# Patient Record
Sex: Female | Born: 1965 | Race: Black or African American | Hispanic: No | Marital: Married | Smoking: Never smoker
Health system: Southern US, Community
[De-identification: ages and names within clinical notes are randomized; demographics above are authoritative.]

## PROBLEM LIST (undated history)

## (undated) DIAGNOSIS — I10 Essential (primary) hypertension: Secondary | ICD-10-CM

## (undated) DIAGNOSIS — E119 Type 2 diabetes mellitus without complications: Secondary | ICD-10-CM

## (undated) DIAGNOSIS — Z87442 Personal history of urinary calculi: Secondary | ICD-10-CM

## (undated) DIAGNOSIS — I82409 Acute embolism and thrombosis of unspecified deep veins of unspecified lower extremity: Secondary | ICD-10-CM

## (undated) DIAGNOSIS — K589 Irritable bowel syndrome without diarrhea: Secondary | ICD-10-CM

## (undated) DIAGNOSIS — I493 Ventricular premature depolarization: Secondary | ICD-10-CM

## (undated) HISTORY — PX: OTHER SURGICAL HISTORY: SHX169

## (undated) HISTORY — DX: Irritable bowel syndrome, unspecified: K58.9

## (undated) HISTORY — DX: Type 2 diabetes mellitus without complications: E11.9

## (undated) HISTORY — DX: Essential (primary) hypertension: I10

## (undated) HISTORY — DX: Acute embolism and thrombosis of unspecified deep veins of unspecified lower extremity: I82.409

## (undated) HISTORY — DX: Ventricular premature depolarization: I49.3

---

## 2000-09-25 ENCOUNTER — Emergency Department (HOSPITAL_COMMUNITY): Admission: EM | Admit: 2000-09-25 | Discharge: 2000-09-26 | Payer: Self-pay | Admitting: *Deleted

## 2000-09-26 ENCOUNTER — Encounter: Payer: Self-pay | Admitting: *Deleted

## 2001-12-09 ENCOUNTER — Emergency Department (HOSPITAL_COMMUNITY): Admission: EM | Admit: 2001-12-09 | Discharge: 2001-12-09 | Payer: Self-pay | Admitting: Emergency Medicine

## 2004-04-26 ENCOUNTER — Ambulatory Visit (HOSPITAL_COMMUNITY): Admission: RE | Admit: 2004-04-26 | Discharge: 2004-04-26 | Payer: Self-pay | Admitting: Family Medicine

## 2004-09-14 ENCOUNTER — Emergency Department (HOSPITAL_COMMUNITY): Admission: EM | Admit: 2004-09-14 | Discharge: 2004-09-14 | Payer: Self-pay | Admitting: Emergency Medicine

## 2004-11-24 ENCOUNTER — Ambulatory Visit: Payer: Self-pay | Admitting: Orthopedic Surgery

## 2005-03-14 ENCOUNTER — Ambulatory Visit (HOSPITAL_COMMUNITY): Admission: RE | Admit: 2005-03-14 | Discharge: 2005-03-14 | Payer: Self-pay | Admitting: Family Medicine

## 2005-04-13 ENCOUNTER — Ambulatory Visit (HOSPITAL_COMMUNITY): Admission: RE | Admit: 2005-04-13 | Discharge: 2005-04-13 | Payer: Self-pay | Admitting: Family Medicine

## 2005-05-16 ENCOUNTER — Ambulatory Visit (HOSPITAL_COMMUNITY): Admission: RE | Admit: 2005-05-16 | Discharge: 2005-05-16 | Payer: Self-pay | Admitting: Family Medicine

## 2005-08-07 ENCOUNTER — Ambulatory Visit (HOSPITAL_COMMUNITY): Admission: RE | Admit: 2005-08-07 | Discharge: 2005-08-07 | Payer: Self-pay | Admitting: Family Medicine

## 2005-09-04 ENCOUNTER — Ambulatory Visit (HOSPITAL_COMMUNITY): Admission: RE | Admit: 2005-09-04 | Discharge: 2005-09-04 | Payer: Self-pay | Admitting: Neurology

## 2005-09-07 ENCOUNTER — Encounter: Admission: RE | Admit: 2005-09-07 | Discharge: 2005-09-07 | Payer: Self-pay | Admitting: Neurology

## 2006-08-28 ENCOUNTER — Emergency Department (HOSPITAL_COMMUNITY): Admission: EM | Admit: 2006-08-28 | Discharge: 2006-08-28 | Payer: Self-pay | Admitting: Emergency Medicine

## 2006-09-07 ENCOUNTER — Ambulatory Visit (HOSPITAL_COMMUNITY): Admission: RE | Admit: 2006-09-07 | Discharge: 2006-09-07 | Payer: Self-pay | Admitting: Urology

## 2007-03-01 ENCOUNTER — Ambulatory Visit (HOSPITAL_COMMUNITY): Admission: RE | Admit: 2007-03-01 | Discharge: 2007-03-01 | Payer: Self-pay | Admitting: Family Medicine

## 2007-09-15 ENCOUNTER — Emergency Department (HOSPITAL_COMMUNITY): Admission: EM | Admit: 2007-09-15 | Discharge: 2007-09-15 | Payer: Self-pay | Admitting: Emergency Medicine

## 2009-07-16 ENCOUNTER — Ambulatory Visit (HOSPITAL_COMMUNITY): Admission: RE | Admit: 2009-07-16 | Discharge: 2009-07-16 | Payer: Self-pay | Admitting: Family Medicine

## 2009-08-26 ENCOUNTER — Ambulatory Visit (HOSPITAL_COMMUNITY): Admission: RE | Admit: 2009-08-26 | Discharge: 2009-08-26 | Payer: Self-pay | Admitting: Family Medicine

## 2009-12-04 ENCOUNTER — Emergency Department (HOSPITAL_COMMUNITY): Admission: EM | Admit: 2009-12-04 | Discharge: 2009-12-04 | Payer: Self-pay | Admitting: Emergency Medicine

## 2010-02-20 ENCOUNTER — Encounter: Payer: Self-pay | Admitting: Family Medicine

## 2010-04-12 LAB — POCT CARDIAC MARKERS
CKMB, poc: <1 ng/mL — ABNORMAL LOW (ref 1.0–8.0)
Myoglobin, poc: 86.2 ng/mL (ref 12–200)
Troponin i, poc: <0.05 ng/mL (ref 0.00–0.09)

## 2010-04-12 LAB — DIFFERENTIAL
Basophils Relative: 0 % (ref 0–1)
Eosinophils Relative: 8 % — ABNORMAL HIGH (ref 0–5)
Monocytes Absolute: 0.3 10*3/uL (ref 0.1–1.0)
Monocytes Relative: 6 % (ref 3–12)
Neutrophils Relative %: 54 % (ref 43–77)

## 2010-04-12 LAB — BASIC METABOLIC PANEL
BUN: 9 mg/dL (ref 6–23)
Calcium: 8.9 mg/dL (ref 8.4–10.5)
Chloride: 103 mEq/L (ref 96–112)
Creatinine, Ser: 0.68 mg/dL (ref 0.4–1.2)
GFR calc non Af Amer: >60 mL/min (ref >60)
Glucose, Bld: 99 mg/dL (ref 70–99)
Potassium: 3.3 mEq/L — ABNORMAL LOW (ref 3.5–5.1)

## 2010-04-12 LAB — CBC
MCV: 81.2 fL (ref 78.0–100.0)
RBC: 4.13 MIL/uL (ref 3.87–5.11)
WBC: 5.3 10*3/uL (ref 4.0–10.5)

## 2010-06-21 ENCOUNTER — Other Ambulatory Visit (HOSPITAL_COMMUNITY): Payer: Self-pay | Admitting: Family Medicine

## 2010-06-21 ENCOUNTER — Ambulatory Visit (HOSPITAL_COMMUNITY)
Admission: RE | Admit: 2010-06-21 | Discharge: 2010-06-21 | Disposition: A | Discharge status: Home / Self Care | Payer: BC Managed Care – PPO | Source: Ambulatory Visit | Attending: Family Medicine | Admitting: Family Medicine

## 2010-06-21 DIAGNOSIS — M1710 Unilateral primary osteoarthritis, unspecified knee: Secondary | ICD-10-CM | POA: Insufficient documentation

## 2010-06-21 DIAGNOSIS — R52 Pain, unspecified: Secondary | ICD-10-CM

## 2010-06-21 DIAGNOSIS — IMO0002 Reserved for concepts with insufficient information to code with codable children: Secondary | ICD-10-CM | POA: Insufficient documentation

## 2010-06-21 DIAGNOSIS — M25569 Pain in unspecified knee: Secondary | ICD-10-CM | POA: Insufficient documentation

## 2010-09-22 ENCOUNTER — Ambulatory Visit: Payer: BC Managed Care – PPO | Admitting: Cardiology

## 2010-10-06 ENCOUNTER — Ambulatory Visit: Payer: BC Managed Care – PPO | Admitting: Cardiology

## 2010-11-10 ENCOUNTER — Ambulatory Visit: Payer: BC Managed Care – PPO | Admitting: Cardiology

## 2010-11-14 LAB — CBC
HCT: 35.1 — ABNORMAL LOW
Hemoglobin: 11.6 — ABNORMAL LOW
Platelets: 276
WBC: 8.1

## 2010-11-14 LAB — COMPREHENSIVE METABOLIC PANEL
ALT: 16
Albumin: 3.6
Alkaline Phosphatase: 61
BUN: 15
Chloride: 108
Glucose, Bld: 136 — ABNORMAL HIGH
Potassium: 3.6
Sodium: 136
Total Bilirubin: 0.3

## 2010-11-14 LAB — DIFFERENTIAL
Basophils Absolute: 0
Basophils Relative: 0
Eosinophils Absolute: 0
Eosinophils Relative: 0
Monocytes Absolute: 0.4
Neutro Abs: 5.8

## 2010-11-14 LAB — URINALYSIS, ROUTINE W REFLEX MICROSCOPIC
Leukocytes, UA: NEGATIVE
Nitrite: NEGATIVE
Specific Gravity, Urine: 1.015
Urobilinogen, UA: 0.2
pH: 6.5

## 2010-11-14 LAB — URINE MICROSCOPIC-ADD ON

## 2010-11-14 LAB — PREGNANCY, URINE: Preg Test, Ur: NEGATIVE

## 2010-11-16 ENCOUNTER — Encounter: Payer: Self-pay | Admitting: Cardiology

## 2010-11-16 ENCOUNTER — Ambulatory Visit: Payer: BC Managed Care – PPO | Admitting: Cardiology

## 2010-11-16 ENCOUNTER — Ambulatory Visit (INDEPENDENT_AMBULATORY_CARE_PROVIDER_SITE_OTHER): Payer: BC Managed Care – PPO | Admitting: Cardiology

## 2010-11-16 DIAGNOSIS — I4949 Other premature depolarization: Secondary | ICD-10-CM

## 2010-11-16 DIAGNOSIS — I1A0 Resistant hypertension: Secondary | ICD-10-CM

## 2010-11-16 DIAGNOSIS — I209 Angina pectoris, unspecified: Secondary | ICD-10-CM | POA: Insufficient documentation

## 2010-11-16 DIAGNOSIS — I10 Essential (primary) hypertension: Secondary | ICD-10-CM

## 2010-11-16 DIAGNOSIS — R0602 Shortness of breath: Secondary | ICD-10-CM | POA: Insufficient documentation

## 2010-11-16 DIAGNOSIS — I493 Ventricular premature depolarization: Secondary | ICD-10-CM | POA: Insufficient documentation

## 2010-11-16 DIAGNOSIS — R079 Chest pain, unspecified: Secondary | ICD-10-CM

## 2010-11-16 HISTORY — DX: Resistant hypertension: I1A.0

## 2010-11-16 NOTE — Assessment & Plan Note (Signed)
Dull, intermittent chest discomfort, also associated with left shoulder discomfort. Features are largely atypical, however some occur with exertion. Cardiac risk factor profile includes recently diagnosed diabetes mellitus, hypertension, and family history. In light of this, associated shortness of breath with activity, and also documented PVCs, further evaluation will be planned via exercise echocardiogram. We will inform her of the results.

## 2010-11-16 NOTE — Assessment & Plan Note (Signed)
Recently diagnosed, followed by Dr. Loleta Chance.

## 2010-11-16 NOTE — Assessment & Plan Note (Signed)
Also described within the last month, further evaluation planned.

## 2010-11-16 NOTE — Assessment & Plan Note (Signed)
Reportedly noted during recent bicycle test in Dr. Adaline Sill office. Patient denies any dizziness or syncope, however has had a sense of palpitations in the last month. LVEF is not known.

## 2010-11-16 NOTE — Progress Notes (Signed)
Clinical Summary Melissa Casey is a 45 y.o.female referred for cardiology consultation by Dr. Mirna Casey. She reports a one-month history of intermittent palpitations, no dizziness or syncope. There has been no clear precipitant. She states that she does not consume alcohol or any significant amount of caffeinated beverage. She also reports an intermittent, aching left shoulder discomfort as well as chest discomfort, sometimes with exertion, but not exclusively. She has also been more short of breath with activity, reporting NYHA class 2-3 symptoms.  She states that she "rode a bicycle" while on ECG in Dr. Adaline Casey office. Specific results are not available, although the patient was reported to have increased number of PVCs.  Lab work from August showed sodium 139, potassium 3.7, BUN 14, creatinine 0.6, cholesterol 139, Was rides 74, HDL 51, LDL 73, AST 14, ALT 13.   No Known Allergies  Medication list reviewed.  Past Medical History  Diagnosis Date  . Essential hypertension, benign   . IBS (irritable bowel syndrome)   . Premature ventricular contractions (PVCs) (VPCs)     Per Dr. Loleta Casey  . Type 2 diabetes mellitus   . DVT (deep venous thrombosis)     History reviewed. No pertinent past surgical history.  Family History  Problem Relation Age of Onset  . Coronary artery disease Mother   . Coronary artery disease Father     Died age 29  . Hypertension Brother   . Arrhythmia Sister     Social History Melissa Casey reports that she has never smoked. She has never used smokeless tobacco. Melissa Casey reports that she does not drink alcohol.  Review of Systems No claudication, no sudden dizziness or syncope. No orthopnea or PND. No reported melena or hematochezia. Reports compliance with medications. No regular exercise regimen. Otherwise negative.  Physical Examination Filed Vitals:   11/16/10 1031  BP: 144/92  Pulse: 80  Resp: 18   Overweight woman in no acute distress. HEENT:  Conjunctiva and lids normal, oropharynx with moist mucosa. Neck: Supple, no elevated JVP or carotid bruits, no thyromegaly. Lungs: Clear to auscultation, nonlabored. Cardiac: Regular rate and rhythm, no S3 gallop or rub. Abdomen: Nontender, bowel sounds present, no bruits. Skin: Warm and dry. Extremities: No pitting edema, distal pulses full. Musculoskeletal: No kyphosis. Neuropsychiatric: Alert and oriented x3, affect appropriate.  ECG Sinus rhythm with poor anterior R wave progression.   Problem List and Plan

## 2010-11-16 NOTE — Patient Instructions (Signed)
Your physician has requested that you have a stress echocardiogram. For further information please visit [email or web address] Please follow instruction sheet as given.  Your physician recommends that you continue on your current medications as directed. Please refer to the Current Medication list given to you today.  Your physician recommends that you schedule a follow-up appointment in: We will contact you with results of test.

## 2010-11-16 NOTE — Assessment & Plan Note (Signed)
Reported history since the late 1990s.

## 2010-11-18 ENCOUNTER — Ambulatory Visit (HOSPITAL_COMMUNITY)
Admission: RE | Admit: 2010-11-18 | Discharge: 2010-11-18 | Disposition: A | Discharge status: Home / Self Care | Payer: BC Managed Care – PPO | Source: Ambulatory Visit | Attending: Cardiology | Admitting: Cardiology

## 2010-11-18 DIAGNOSIS — R079 Chest pain, unspecified: Secondary | ICD-10-CM

## 2010-11-18 DIAGNOSIS — I10 Essential (primary) hypertension: Secondary | ICD-10-CM | POA: Insufficient documentation

## 2010-11-18 DIAGNOSIS — R072 Precordial pain: Secondary | ICD-10-CM

## 2010-11-18 DIAGNOSIS — R0602 Shortness of breath: Secondary | ICD-10-CM

## 2010-11-18 DIAGNOSIS — I4949 Other premature depolarization: Secondary | ICD-10-CM

## 2010-11-18 DIAGNOSIS — E119 Type 2 diabetes mellitus without complications: Secondary | ICD-10-CM | POA: Insufficient documentation

## 2010-11-18 NOTE — Progress Notes (Signed)
Stress Lab Nurses Notes - [name]  Melissa Casey 11/18/2010  Reason for doing test: Chest Pain  Type of test: Stress Echo  Nurse performing test: Parke Poisson, RN  Nuclear Medicine Tech: Not Applicable  Echo Tech: Karrie Doffing  MD performing test: Ival Bible & Joni Reining, NP  Family MD: [facility]  Test explained and consent signed: yes  IV started: No IV started  Symptoms: SOB and fatigue. Had some dizziness in recovery and resolved.  Treatment/Intervention: None  Reason test stopped: fatigue and SOB  After recovery IV was: NA  Patient to return to Nuc. Med at : NA  Patient discharged: Home  Patient's Condition upon discharge was: stable  Comments: During test peak BP 178/78 & HR 152 . Recovery BP 120/78 & HR 92.  Symptoms resolved in recovery.    Erskine Speed T

## 2010-11-18 NOTE — Progress Notes (Signed)
*  PRELIMINARY RESULTS* Echocardiogram Echocardiogram Stress Test has been performed.  Conrad [place] 11/18/2010, 11:14 AM

## 2010-11-21 ENCOUNTER — Encounter: Payer: Self-pay | Admitting: *Deleted

## 2011-02-17 ENCOUNTER — Encounter (HOSPITAL_COMMUNITY): Payer: Self-pay | Admitting: Cardiology

## 2011-12-18 ENCOUNTER — Encounter (HOSPITAL_COMMUNITY): Payer: Self-pay

## 2011-12-18 ENCOUNTER — Emergency Department (HOSPITAL_COMMUNITY)
Admission: EM | Admit: 2011-12-18 | Discharge: 2011-12-18 | Disposition: A | Discharge status: Home / Self Care | Payer: BC Managed Care – PPO | Attending: Emergency Medicine | Admitting: Emergency Medicine

## 2011-12-18 DIAGNOSIS — Z79899 Other long term (current) drug therapy: Secondary | ICD-10-CM | POA: Insufficient documentation

## 2011-12-18 DIAGNOSIS — E119 Type 2 diabetes mellitus without complications: Secondary | ICD-10-CM | POA: Insufficient documentation

## 2011-12-18 DIAGNOSIS — Z7982 Long term (current) use of aspirin: Secondary | ICD-10-CM | POA: Insufficient documentation

## 2011-12-18 DIAGNOSIS — K089 Disorder of teeth and supporting structures, unspecified: Secondary | ICD-10-CM | POA: Insufficient documentation

## 2011-12-18 DIAGNOSIS — K0889 Other specified disorders of teeth and supporting structures: Secondary | ICD-10-CM

## 2011-12-18 DIAGNOSIS — Z86718 Personal history of other venous thrombosis and embolism: Secondary | ICD-10-CM | POA: Insufficient documentation

## 2011-12-18 DIAGNOSIS — I10 Essential (primary) hypertension: Secondary | ICD-10-CM | POA: Insufficient documentation

## 2011-12-18 DIAGNOSIS — Z8719 Personal history of other diseases of the digestive system: Secondary | ICD-10-CM | POA: Insufficient documentation

## 2011-12-18 DIAGNOSIS — I4949 Other premature depolarization: Secondary | ICD-10-CM | POA: Insufficient documentation

## 2011-12-18 MED ORDER — IBUPROFEN 800 MG PO TABS
800.0000 mg | ORAL_TABLET | Freq: Once | ORAL | Status: AC
  Administered 2011-12-18: 800 mg via ORAL
  Filled 2011-12-18: qty 1

## 2011-12-18 MED ORDER — PENICILLIN V POTASSIUM 500 MG PO TABS: 500.0000 mg | ORAL_TABLET | Freq: Four times a day (QID) | ORAL | Status: AC

## 2011-12-18 MED ORDER — IBUPROFEN 800 MG PO TABS: 800.0000 mg | ORAL_TABLET | Freq: Three times a day (TID) | ORAL | Status: DC

## 2011-12-18 MED ORDER — HYDROCODONE-ACETAMINOPHEN 5-325 MG PO TABS: ORAL_TABLET | ORAL | Status: DC

## 2011-12-18 MED ORDER — PENICILLIN V POTASSIUM 250 MG PO TABS
500.0000 mg | ORAL_TABLET | Freq: Once | ORAL | Status: AC
  Administered 2011-12-18: 500 mg via ORAL
  Filled 2011-12-18: qty 2

## 2011-12-18 NOTE — ED Notes (Signed)
Pt c/o toothache and r sided facial swelling since Friday.

## 2011-12-18 NOTE — ED Provider Notes (Signed)
Medical screening examination/treatment/procedure(s) were performed by non-physician practitioner and as supervising physician I was immediately available for consultation/collaboration. Devoria Albe, MD, Armando Gang    Ward Givens, MD 12/18/11 [phone]

## 2011-12-18 NOTE — ED Provider Notes (Addendum)
History     CSN: [id number]  Arrival date & time 12/18/11  8657   First MD Initiated Contact with Patient 12/18/11 [phone]      Chief Complaint  Patient presents with  . Dental Pain    (Consider location/radiation/quality/duration/timing/severity/associated sxs/prior treatment) Patient is a 46 y.o. female presenting with tooth pain. The history is provided by the patient.  Dental PainThe primary symptoms include mouth pain. Primary symptoms do not include dental injury, oral bleeding, oral lesions, headaches, fever, shortness of breath, sore throat, angioedema or cough. The symptoms began 3 to 5 days ago. The symptoms are unchanged. The symptoms are new. The symptoms occur constantly.  Mouth pain began 3 - 5 days ago. Mouth pain occurs constantly. Mouth pain is worsening. Affected locations include: teeth and gum(s).  Additional symptoms include: dental sensitivity to temperature, gum swelling, gum tenderness and facial swelling. Additional symptoms do not include: purulent gums, trismus, trouble swallowing, pain with swallowing, drooling, ear pain and swollen glands. Medical issues include: periodontal disease. Medical issues do not include: smoking.    Past Medical History  Diagnosis Date  . Essential hypertension, benign   . IBS (irritable bowel syndrome)   . Premature ventricular contractions (PVCs) (VPCs)     Per Dr. Loleta Chance  . Type 2 diabetes mellitus   . DVT (deep venous thrombosis)     History reviewed. No pertinent past surgical history.  Family History  Problem Relation Age of Onset  . Coronary artery disease Mother   . Coronary artery disease Father     Died age 83  . Hypertension Brother   . Arrhythmia Sister     History  Substance Use Topics  . Smoking status: Never Smoker   . Smokeless tobacco: Never Used  . Alcohol Use: No    OB History    Grav Para Term Preterm Abortions TAB SAB Ect Mult Living                  Review of Systems  Constitutional:  Negative for fever and appetite change.  HENT: Positive for facial swelling and dental problem. Negative for ear pain, congestion, sore throat, drooling, trouble swallowing, neck pain and neck stiffness.   Eyes: Negative for pain and visual disturbance.  Respiratory: Negative for cough and shortness of breath.   Gastrointestinal: Negative for nausea and vomiting.  Musculoskeletal: Negative for arthralgias.  Skin: Negative for rash.  Neurological: Negative for dizziness, facial asymmetry and headaches.  Hematological: Negative for adenopathy.  All other systems reviewed and are negative.    Allergies  Review of patient's allergies indicates no known allergies.  Home Medications   Current Outpatient Rx  Name  Route  Sig  Dispense  Refill  . ASPIRIN 81 MG PO TABS   Oral   Take 81 mg by mouth daily.           Marland Kitchen LIBRAX PO   Oral   Take 10 mg by mouth at bedtime.           . IBUPROFEN 800 MG PO TABS   Oral   Take 800 mg by mouth 2 (two) times daily.           . MELOXICAM 7.5 MG PO TABS   Oral   Take 7.5 mg by mouth daily.           Marland Kitchen METFORMIN HCL 1000 MG PO TABS   Oral   Take 1,000 mg by mouth daily with breakfast.           .  OLMESARTAN-AMLODIPINE-HCTZ 40-5-25 MG PO TABS   Oral   Take 1 tablet by mouth daily.           Marland Kitchen ALIGN 4 MG PO CAPS   Oral   Take 1 capsule by mouth daily.             BP 149/81  Pulse 81  Temp 98.7 F (37.1 C) (Oral)  Resp 20  Ht 5\' 8"  (1.727 m)  Wt 265 lb (120.203 kg)  BMI 40.29 kg/m2  SpO2 100%  Physical Exam  Nursing note and vitals reviewed. Constitutional: She is oriented to person, place, and time. She appears well-developed and well-nourished. No distress.  HENT:  Head: Normocephalic and atraumatic. No trismus in the jaw.  Right Ear: Tympanic membrane and ear canal normal.  Left Ear: Tympanic membrane and ear canal normal.  Mouth/Throat: Uvula is midline, oropharynx is clear and moist and mucous membranes are  normal. Dental caries present. No dental abscesses or uvula swelling.         significant dental decay with ttp of the right upper incisor and premolar.  Mild erythema and swelling of the surrounding gums.  Slight induration of the right face at the nasolabial fold.  No erythema.  Likely early dental abscess.   Eyes: EOM are normal. Pupils are equal, round, and reactive to light.  Neck: Normal range of motion. Neck supple.  Cardiovascular: Normal rate, regular rhythm, normal heart sounds and intact distal pulses.   No murmur heard. Pulmonary/Chest: Effort normal and breath sounds normal.  Musculoskeletal: Normal range of motion.  Lymphadenopathy:    She has no cervical adenopathy.  Neurological: She is alert and oriented to person, place, and time. She exhibits normal muscle tone. Coordination normal.  Skin: Skin is warm and dry.    ED Course  Procedures (including critical care time)  Labs Reviewed - No data to display No results found.      MDM    Likely early dental abscess, pt is non-toxic appearing.  Has appt with her dentist in 3 days.    Prescribed Pen VK Ibuprofen norco #10      Donzel Romack L. Tonie Elsey, PA 12/18/11 0914  Graeson [place] L. [place], [place] 01/01/12 1654

## 2012-01-02 NOTE — ED Provider Notes (Signed)
Medical screening examination/treatment/procedure(s) were performed by non-physician practitioner and as supervising physician I was immediately available for consultation/collaboration. Devoria Albe, MD, FACEP   Ward Givens, MD 01/02/12 [phone]

## 2012-04-04 ENCOUNTER — Encounter: Payer: Self-pay | Admitting: Adult Health

## 2012-04-04 ENCOUNTER — Encounter: Payer: BC Managed Care – PPO | Admitting: Adult Health

## 2012-04-04 NOTE — Progress Notes (Signed)
Error. No show.

## 2012-04-05 ENCOUNTER — Ambulatory Visit: Payer: Self-pay | Admitting: Adult Health

## 2012-04-15 ENCOUNTER — Ambulatory Visit (INDEPENDENT_AMBULATORY_CARE_PROVIDER_SITE_OTHER): Payer: BC Managed Care – PPO | Admitting: Adult Health

## 2012-04-15 ENCOUNTER — Encounter: Payer: Self-pay | Admitting: Adult Health

## 2012-04-15 VITALS — BP 159/78 | HR 79 | Ht 66.0 in | Wt 266.2 lb

## 2012-04-15 DIAGNOSIS — R0602 Shortness of breath: Secondary | ICD-10-CM

## 2012-04-15 DIAGNOSIS — I10 Essential (primary) hypertension: Secondary | ICD-10-CM

## 2012-04-15 DIAGNOSIS — R079 Chest pain, unspecified: Secondary | ICD-10-CM

## 2012-04-15 MED ORDER — OLMESARTAN-AMLODIPINE-HCTZ 40-10-25 MG PO TABS: 1.0000 | ORAL_TABLET | Freq: Every day | ORAL | Status: DC

## 2012-04-15 NOTE — Progress Notes (Signed)
Name: Melissa Casey    DOB: 1965/10/03  Age: 47 y.o.  MR#: [id number]       PCP:  Evlyn Courier, MD      Insurance: Payor: BLUE CROSS BLUE [employer]IELD  Plan: BCBS PPO OUT OF STATE  Product Type: *No Product type*    CC:    Chief Complaint  Patient presents with  . Chest Pain    VS Filed Vitals:   04/15/12 1356  BP: 159/78  Pulse: 79  Height: 5\' 6"  (1.676 m)  Weight: 266 lb 4 oz (120.77 kg)    Weights Current Weight  04/15/12 266 lb 4 oz (120.77 kg)  12/18/11 265 lb (120.203 kg)  11/16/10 256 lb (116.121 kg)    Blood Pressure  BP Readings from Last 3 Encounters:  04/15/12 159/78  12/18/11 149/81  11/16/10 144/92     Admit date:  (Not on file) Last encounter with RMR:  04/05/2012   Allergy Review of patient's allergies indicates no known allergies.  Current Outpatient Prescriptions  Medication Sig Dispense Refill  . aspirin 81 MG tablet Take 81 mg by mouth daily.        Marland Kitchen HYDROcodone-acetaminophen (NORCO/VICODIN) 5-325 MG per tablet Take one-two tabs po q 4-6 hrs prn pain  10 tablet  0  . ibuprofen (ADVIL,MOTRIN) 800 MG tablet Take 800 mg by mouth 2 (two) times daily.        . Olmesartan-Amlodipine-HCTZ (TRIBENZOR) 40-5-25 MG TABS Take 1 tablet by mouth daily.         No current facility-administered medications for this visit.    Discontinued Meds:    Medications Discontinued During This Encounter  Medication Reason  . Clidinium-Chlordiazepoxide (LIBRAX PO) Error  . ibuprofen (ADVIL,MOTRIN) 800 MG tablet Error  . meloxicam (MOBIC) 7.5 MG tablet Error  . Probiotic Product (ALIGN) 4 MG CAPS Error  . metFORMIN (GLUCOPHAGE) 1000 MG tablet Error    Patient Active Problem List  Diagnosis  . Chest pain  . Shortness of breath  . PVC's (premature ventricular contractions)  . Essential hypertension, benign  . Type II or unspecified type diabetes mellitus without mention of complication, uncontrolled    LABS    Component Value Date/Time   NA 138 12/04/2009 1156    NA 136 08/28/2006 0732   K 3.3* 12/04/2009 1156   K 3.6 08/28/2006 0732   CL 103 12/04/2009 1156   CL 108 08/28/2006 0732   CO2 27 12/04/2009 1156   CO2 22 08/28/2006 0732   GLUCOSE 99 12/04/2009 1156   GLUCOSE 136* 08/28/2006 0732   BUN 9 12/04/2009 1156   BUN 15 08/28/2006 0732   CREATININE 0.68 12/04/2009 1156   CREATININE 1.05 08/28/2006 0732   CALCIUM 8.9 12/04/2009 1156   CALCIUM 8.9 08/28/2006 0732   GFRNONAA >60 12/04/2009 1156   GFRNONAA 58* 08/28/2006 0732   GFRAA  Value: >60        The eGFR has been calculated using the MDRD equation. This calculation has not been validated in all clinical situations. eGFR's persistently <60 mL/min signify possible Chronic Kidney Disease. 12/04/2009 1156   GFRAA  Value: >60        The eGFR has been calculated using the MDRD equation. This calculation has not been validated in all clinical 08/28/2006 0732   CMP     Component Value Date/Time   NA 138 12/04/2009 1156   K 3.3* 12/04/2009 1156   CL 103 12/04/2009 1156   CO2 27 12/04/2009 1156  GLUCOSE 99 12/04/2009 1156   BUN 9 12/04/2009 1156   CREATININE 0.68 12/04/2009 1156   CALCIUM 8.9 12/04/2009 1156   PROT 7.0 08/28/2006 0732   ALBUMIN 3.6 08/28/2006 0732   AST 19 08/28/2006 0732   ALT 16 08/28/2006 0732   ALKPHOS 61 08/28/2006 0732   BILITOT 0.3 08/28/2006 0732   GFRNONAA >60 12/04/2009 1156   GFRAA  Value: >60        The eGFR has been calculated using the MDRD equation. This calculation has not been validated in all clinical situations. eGFR's persistently <60 mL/min signify possible Chronic Kidney Disease. 12/04/2009 1156       Component Value Date/Time   WBC 5.3 12/04/2009 1156   WBC 8.1 08/28/2006 0732   HGB 11.0* 12/04/2009 1156   HGB 11.6* 08/28/2006 0732   HCT 33.6* 12/04/2009 1156   HCT 35.1* 08/28/2006 0732   MCV 81.2 12/04/2009 1156   MCV 81.9 08/28/2006 0732    Lipid Panel  No results found for this basename: chol, trig, hdl, cholhdl, vldl, ldlcalc    ABG No results found for this basename:  phart, pco2, pco2art, po2, po2art, hco3, tco2, acidbasedef, o2sat     No results found for this basename: TSH   BNP (last 3 results) No results found for this basename: PROBNP,  in the last 8760 hours Cardiac Panel (last 3 results) No results found for this basename: CKTOTAL, CKMB, TROPONINI, RELINDX,  in the last 72 hours  Iron/TIBC/Ferritin No results found for this basename: iron, tibc, ferritin     EKG Orders placed in visit on 04/15/12  . EKG 12-LEAD     Prior Assessment and Plan Problem List as of 04/15/2012     ICD-9-CM   Chest pain   Last Assessment & Plan   11/16/2010 Office Visit Written 11/16/2010 11:45 AM by Jonelle Sidle, MD     Dull, intermittent chest discomfort, also associated with left shoulder discomfort. Features are largely atypical, however some occur with exertion. Cardiac risk factor profile includes recently diagnosed diabetes mellitus, hypertension, and family history. In light of this, associated shortness of breath with activity, and also documented PVCs, further evaluation will be planned via exercise echocardiogram. We will inform her of the results.    Shortness of breath   Last Assessment & Plan   11/16/2010 Office Visit Written 11/16/2010 11:46 AM by Jonelle Sidle, MD     Also described within the last month, further evaluation planned.    PVC's (premature ventricular contractions)   Last Assessment & Plan   11/16/2010 Office Visit Written 11/16/2010 11:44 AM by Jonelle Sidle, MD     Reportedly noted during recent bicycle test in Dr. Adaline Sill office. Patient denies any dizziness or syncope, however has had a sense of palpitations in the last month. LVEF is not known.    Essential hypertension, benign   Last Assessment & Plan   11/16/2010 Office Visit Written 11/16/2010 11:46 AM by Jonelle Sidle, MD     Reported history since the late 1990s.    Type II or unspecified type diabetes mellitus without mention of complication,  uncontrolled   Last Assessment & Plan   11/16/2010 Office Visit Written 11/16/2010 11:46 AM by Jonelle Sidle, MD     Recently diagnosed, followed by Dr. Loleta Chance.        Imaging: No results found.

## 2012-04-15 NOTE — Progress Notes (Signed)
   HPI: Melissa Casey is a 47 year old patient of Dr. Simona Huh we are following for ongoing assessment and management of palpitations, with history of hypertension, diabetes, and nonspecific chest discomfort. The patient was seen by Dr. Durene Fruits last in October of 2012. The patient had a stress echocardiogram completed October 2012 that was negative for ischemia. On last visit she complained of recurrent chest pain I stress echo was ordered. She is here to discuss the results. She continues to complain of some heartburn my symptoms, and feeling as if food is stuck in her esophagus especially at night when lying down. Chest and tingling in her left hand when this occurs. Otherwise the patient has had no new complaints.  No Known Allergies  Current Outpatient Prescriptions  Medication Sig Dispense Refill  . aspirin 81 MG tablet Take 81 mg by mouth daily.        . Clidinium-Chlordiazepoxide (LIBRAX PO) Take 10 mg by mouth at bedtime.        Marland Kitchen HYDROcodone-acetaminophen (NORCO/VICODIN) 5-325 MG per tablet Take one-two tabs po q 4-6 hrs prn pain  10 tablet  0  . ibuprofen (ADVIL,MOTRIN) 800 MG tablet Take 800 mg by mouth 2 (two) times daily.        Marland Kitchen ibuprofen (ADVIL,MOTRIN) 800 MG tablet Take 1 tablet (800 mg total) by mouth 3 (three) times daily. Take with food  21 tablet  0  . meloxicam (MOBIC) 7.5 MG tablet Take 7.5 mg by mouth daily.        . metFORMIN (GLUCOPHAGE) 1000 MG tablet Take 1,000 mg by mouth daily with breakfast.        . Olmesartan-Amlodipine-HCTZ (TRIBENZOR) 40-5-25 MG TABS Take 1 tablet by mouth daily.        . Probiotic Product (ALIGN) 4 MG CAPS Take 1 capsule by mouth daily.         No current facility-administered medications for this visit.    Past Medical History  Diagnosis Date  . Essential hypertension, benign   . IBS (irritable bowel syndrome)   . Premature ventricular contractions (PVCs) (VPCs)     Per Dr. Loleta Chance  . Type 2 diabetes mellitus   . DVT (deep venous  thrombosis)     No past surgical history on file.  WUJ:WJXBJY of systems complete and found to be negative unless listed above  PHYSICAL EXAM There were no vitals taken for this visit. General: Well developed, well nourished, in no acute distress Head: Eyes PERRLA, No xanthomas.   Normal cephalic and atramatic  Lungs: Clear bilaterally to auscultation and percussion. Heart: HRRR S1 S2, without MRG.  Pulses are 2+ & equal.            No carotid bruit. No JVD.  No abdominal bruits. No femoral bruits. Abdomen: Bowel sounds are positive, abdomen soft and non-tender without masses or                  Hernia's noted. Msk:  Back normal, normal gait. Normal strength and tone for age. Extremities: No clubbing, cyanosis or edema.  DP +1 Neuro: Alert and oriented X 3. Psych:  Good affect, responds appropriately  EKG: Normal sinus rhythm, nonspecific T-wave abnormalities noted laterally. Rate of 74 beats per minute.  ASSESSMENT AND PLAN

## 2012-04-15 NOTE — Assessment & Plan Note (Signed)
Blood pressure is not well-controlled currently for someone with diabetes. She is on samples of TriBenZor (Omnesartan 40 mg-amlodipine 5 mg-HCTZ 25 mg) which she is receiving samples from her primary care physician Dr. Loleta Chance. Optimal blood pressure control will have her systolic blood pressure in the 130s to 135 millimeters mercury. I have asked her to go by Dr. Adaline Sill office with an increase in her medication to include increased amlodipine to 5 mg if the bad sample strength is available. She verbalizes understanding

## 2012-04-15 NOTE — Addendum Note (Signed)
Addended by: Reather Laurence A on: 04/15/2012 02:38 PM   Modules accepted: Orders

## 2012-04-15 NOTE — Assessment & Plan Note (Signed)
Stress echocardiogram completed revealed no echocardiographic evidence for stress-induced ischemia, EKG showed no diagnostic ST changes or inducible arrhythmias. The stress ECG was negative for ischemia. I explained this to her and she verbalizes understanding. She continues to be concerned about heartburn type symptoms along with feelings of food being stuck in her esophagus when she lies down. I started her on a low-dose H2 blocker Pepcid 20 mg daily. She is to followup with her primary care physician for ongoing assessment and need to refer to GI at his discretion.

## 2012-04-15 NOTE — Patient Instructions (Addendum)
Your physician recommends that you schedule a follow-up appointment in: 6 months  Your physician has recommended you make the following change in your medication:  1 - INCREASE Tribenzor to 40-10-25 mg - obtain samples from Dr Loleta Chance 2 - START Pepcid 20 mg daily  Follow up with Dr Loleta Chance soon

## 2012-04-15 NOTE — Assessment & Plan Note (Signed)
This can be multifactorial secondary to obesity, deconditioning, and questionable poor sleep hygiene. Further asses for her symptoms. For her symptoms. We will see her again in 6 months unless she becomes further symptomatic at which time we will have to plan a catheterization.sment by primary care physician in this setting is recommended. No cardiac etiology is noted

## 2012-07-25 ENCOUNTER — Other Ambulatory Visit (HOSPITAL_COMMUNITY): Payer: Self-pay | Admitting: Family Medicine

## 2012-07-25 ENCOUNTER — Ambulatory Visit (HOSPITAL_COMMUNITY)
Admission: RE | Admit: 2012-07-25 | Discharge: 2012-07-25 | Disposition: A | Discharge status: Home / Self Care | Payer: BC Managed Care – PPO | Source: Ambulatory Visit | Attending: Family Medicine | Admitting: Family Medicine

## 2012-07-25 DIAGNOSIS — M8530 Osteitis condensans, unspecified site: Secondary | ICD-10-CM | POA: Insufficient documentation

## 2012-07-25 DIAGNOSIS — K5900 Constipation, unspecified: Secondary | ICD-10-CM | POA: Insufficient documentation

## 2012-08-13 ENCOUNTER — Other Ambulatory Visit (HOSPITAL_COMMUNITY): Payer: Self-pay | Admitting: Family Medicine

## 2012-08-13 DIAGNOSIS — N200 Calculus of kidney: Secondary | ICD-10-CM

## 2012-08-14 ENCOUNTER — Ambulatory Visit (HOSPITAL_COMMUNITY): Payer: BC Managed Care – PPO

## 2012-08-16 ENCOUNTER — Ambulatory Visit (HOSPITAL_COMMUNITY)
Admission: RE | Admit: 2012-08-16 | Discharge: 2012-08-16 | Disposition: A | Discharge status: Home / Self Care | Payer: BC Managed Care – PPO | Source: Ambulatory Visit | Attending: Family Medicine | Admitting: Family Medicine

## 2012-08-16 DIAGNOSIS — N200 Calculus of kidney: Secondary | ICD-10-CM | POA: Insufficient documentation

## 2012-08-16 DIAGNOSIS — I10 Essential (primary) hypertension: Secondary | ICD-10-CM | POA: Insufficient documentation

## 2012-08-16 DIAGNOSIS — E119 Type 2 diabetes mellitus without complications: Secondary | ICD-10-CM | POA: Insufficient documentation

## 2013-03-21 ENCOUNTER — Telehealth: Payer: Self-pay | Admitting: Adult Health

## 2013-03-21 NOTE — Telephone Encounter (Signed)
Requested pre auth for med to be re[facility]ed [facility],attention Terry

## 2013-03-21 NOTE — Telephone Encounter (Signed)
Received fax refill request  Rx # [id number] Medication:  Tribenzor 40-10-25 mg tab Qty 30 Sig:  Take one tablet by mouth once daily Physician:  Lyman BishopLawrence

## 2013-03-21 NOTE — Telephone Encounter (Signed)
Needs recall from last visit 03/2012 sent to T.Roseanne RenoStewart and T.jackson to schedule    [facility] of [facility] pre auth  [facility]

## 2013-03-21 NOTE — Telephone Encounter (Signed)
Please see paper in refill bin / tgs  °

## 2013-04-01 ENCOUNTER — Encounter: Payer: Self-pay | Admitting: *Deleted

## 2013-04-02 ENCOUNTER — Other Ambulatory Visit: Payer: Self-pay | Admitting: *Deleted

## 2013-04-02 ENCOUNTER — Encounter: Payer: Self-pay | Admitting: *Deleted

## 2013-04-02 DIAGNOSIS — I10 Essential (primary) hypertension: Secondary | ICD-10-CM

## 2013-04-02 DIAGNOSIS — I493 Ventricular premature depolarization: Secondary | ICD-10-CM

## 2013-04-07 ENCOUNTER — Encounter: Payer: BC Managed Care – PPO | Admitting: Adult Health

## 2013-04-07 ENCOUNTER — Encounter: Payer: Self-pay | Admitting: *Deleted

## 2013-04-07 NOTE — Progress Notes (Signed)
    HPI: [name] is a 48 year old patient of Melissa Casey we are following for ongoing assessment and management of palpitations, history of hypertension, diabetes, and nonspecific chest discomfort. Is seen in the office one year ago. She had a stress echocardiogram completed in March of 2014 revealing no echocardiographic evidence for stress-induced ischemia. EKG showed no diagnostic ST changes or inducible arrhythmias. Overall the stress test was found to be negative for ischemia.  On last visit the patient's blood pressure was not well-controlled, she was given samples of high been sore (on the Star and 40 mg, amlodipine 5 mg, HCTZ 25 mg, (as she is receiving niece also from her primary care physician Melissa Casey.  No Known Allergies  Current Outpatient Prescriptions  Medication Sig Dispense Refill  . aspirin 81 MG tablet Take 81 mg by mouth daily.        Marland Kitchen. HYDROcodone-acetaminophen (NORCO/VICODIN) 5-325 MG per tablet Take one-two tabs po q 4-6 hrs prn pain  10 tablet  0  . ibuprofen (ADVIL,MOTRIN) 800 MG tablet Take 800 mg by mouth 2 (two) times daily.        . Olmesartan-Amlodipine-HCTZ 40-10-25 MG TABS Take 1 tablet by mouth daily.  30 tablet  6  . Saxagliptin-Metformin (KOMBIGLYZE XR) 2.05-998 MG TB24 Take 1 tablet by mouth.       No current facility-administered medications for this visit.    Past Medical History  Diagnosis Date  . Essential hypertension, benign   . IBS (irritable bowel syndrome)   . Premature ventricular contractions (PVCs) (VPCs)     Per Melissa Casey  . Type 2 diabetes mellitus   . DVT (deep venous thrombosis)     No past surgical history on file.  ROS: PHYSICAL EXAM There were no vitals taken for this visit.  EKG:  ASSESSMENT AND PLAN

## 2013-04-10 ENCOUNTER — Ambulatory Visit (INDEPENDENT_AMBULATORY_CARE_PROVIDER_SITE_OTHER): Payer: BC Managed Care – PPO | Admitting: Adult Health

## 2013-04-10 ENCOUNTER — Encounter: Payer: Self-pay | Admitting: Adult Health

## 2013-04-10 VITALS — BP 135/65 | HR 81 | Ht 66.0 in | Wt 271.0 lb

## 2013-04-10 DIAGNOSIS — R002 Palpitations: Secondary | ICD-10-CM

## 2013-04-10 DIAGNOSIS — R0602 Shortness of breath: Secondary | ICD-10-CM

## 2013-04-10 DIAGNOSIS — I10 Essential (primary) hypertension: Secondary | ICD-10-CM

## 2013-04-10 MED ORDER — OLMESARTAN MEDOXOMIL-HCTZ 40-12.5 MG PO TABS: 1.0000 | ORAL_TABLET | Freq: Every day | ORAL | Status: DC

## 2013-04-10 NOTE — Progress Notes (Signed)
    HPI: [name] is a 48 year old patient of Dr. Diona BrownerMcDowell we are following for ongoing assessment and management of palpitations, hypertension, with history of diabetes and nonspecific chest discomfort. The patient was last seen in the office on April 15 2012, for discussion of stress echo results with complaints of recurrent chest pain. She also complained of heartburn symptoms.    Stress echocardiogram revealed no evidence of stress-induced ischemia. She was referred to GI for evaluation of esophageal abnormalities and was placed on a PPI. Blood pressure was not adequately controlled on last visit. She is receiving samples from her primary care physician Dr. Loleta ChanceHill to include on the start and 40 mg, amlodipine 5 mg and HCTZ 25 mg daily.      She was advised to go by Dr. Adaline SillHill's office to get increase the dose of amlodipine portion of her medication, as she is on TriBenZor combination medication. She also complained of some mild shortness of breath which was thought to be related to deconditioning.    She is continuing to have issues with GERD but has not had any GI follow up. She states that TriBenZor is too expensive for her and she wishe to have a different medication that is of lower cost.   No Known Allergies  Current Outpatient Prescriptions  Medication Sig Dispense Refill  . aspirin 81 MG tablet Take 81 mg by mouth daily.        Marland Kitchen. ibuprofen (ADVIL,MOTRIN) 800 MG tablet Take 800 mg by mouth 2 (two) times daily.        . Olmesartan-Amlodipine-HCTZ 40-10-25 MG TABS Take 1 tablet by mouth daily.  30 tablet  6  . Saxagliptin-Metformin (KOMBIGLYZE XR) 2.05-998 MG TB24 Take 1 tablet by mouth.       No current facility-administered medications for this visit.    Past Medical History  Diagnosis Date  . Essential hypertension, benign   . IBS (irritable bowel syndrome)   . Premature ventricular contractions (PVCs) (VPCs)     Per Dr. Loleta ChanceHill  . Type 2 diabetes mellitus   . DVT (deep venous  thrombosis)     History reviewed. No pertinent past surgical history.  ROS: PHYSICAL EXAM BP 135/65  Pulse 81  Ht 5\' 6"  (1.676 m)  Wt 271 lb (122.925 kg)  BMI 43.76 kg/m2  EKG:  ASSESSMENT AND PLAN

## 2013-04-10 NOTE — Assessment & Plan Note (Signed)
I advised her to followup with Dr. Loleta ChanceHill, and ask about sleep apnea, and possible sleep study due to body habitus and ongoing hypertension.

## 2013-04-10 NOTE — Assessment & Plan Note (Addendum)
He is requesting more samples for antihypertensive medications. We do not have any available. She is concerned about the cost of her current medication. I have advised her that we can give her a prescription for Benicar 40 mg/HCTZ 12.5 mg. She has [employer] and [employer] and this should help to cover the cost. It is not on [employer] [facility] plan. Would prefer that she be on an ARB.   Addendum:  The patient is upset at the cost of the medication prescribed, stating now that a pre-authorization should be completed and it is too expensive for her. She now be placed on lisinopril 10/12.5 mg daily.We will see her in a year and have her see PCP on follow-up.

## 2013-04-10 NOTE — Progress Notes (Deleted)
Name: Melissa Casey    DOB: 08/07/1965  Age: 48 y.o.  MR#: [id number]       PCP:  Maggie Font, MD      Insurance: Payor: [place] / Plan: [place] / Product Type: *No Product type* /   CC:    Chief Complaint  Patient presents with  . Hypertension    VS Filed Vitals:   04/10/13 1531  BP: 135/65  Pulse: 81  Height: '5\' 6"'  (1.676 m)  Weight: 271 lb (122.925 kg)    Weights Current Weight  04/10/13 271 lb (122.925 kg)  04/15/12 266 lb 4 oz (120.77 kg)  12/18/11 265 lb (120.203 kg)    Blood Pressure  BP Readings from Last 3 Encounters:  04/10/13 135/65  04/15/12 159/78  12/18/11 149/81     Admit date:  (Not on file) Last encounter with RMR:  03/21/2013   Allergy Review of patient's allergies indicates no known allergies.  Current Outpatient Prescriptions  Medication Sig Dispense Refill  . aspirin 81 MG tablet Take 81 mg by mouth daily.        Marland Kitchen ibuprofen (ADVIL,MOTRIN) 800 MG tablet Take 800 mg by mouth 2 (two) times daily.        . Olmesartan-Amlodipine-HCTZ 40-10-25 MG TABS Take 1 tablet by mouth daily.  30 tablet  6  . Saxagliptin-Metformin (KOMBIGLYZE XR) 2.05-998 MG TB24 Take 1 tablet by mouth.       No current facility-administered medications for this visit.    Discontinued Meds:    Medications Discontinued During This Encounter  Medication Reason  . HYDROcodone-acetaminophen (NORCO/VICODIN) 5-325 MG per tablet Error    Patient Active Problem List   Diagnosis Date Noted  . Chest pain 11/16/2010  . Shortness of breath 11/16/2010  . PVC's (premature ventricular contractions) 11/16/2010  . Essential hypertension, benign 11/16/2010  . Type II or unspecified type diabetes mellitus without mention of complication, uncontrolled 11/16/2010    LABS    Component Value Date/Time   NA 138 12/04/2009 1156   NA 136 08/28/2006 0732   K 3.3* 12/04/2009 1156   K 3.6 08/28/2006 0732   CL 103 12/04/2009 1156   CL 108 08/28/2006 0732   CO2  27 12/04/2009 1156   CO2 22 08/28/2006 0732   GLUCOSE 99 12/04/2009 1156   GLUCOSE 136* 08/28/2006 0732   BUN 9 12/04/2009 1156   BUN 15 08/28/2006 0732   CREATININE 0.68 12/04/2009 1156   CREATININE 1.05 08/28/2006 0732   CALCIUM 8.9 12/04/2009 1156   CALCIUM 8.9 08/28/2006 0732   GFRNONAA >60 12/04/2009 1156   GFRNONAA 58* 08/28/2006 0732   GFRAA  Value: >60        The eGFR has been calculated using the MDRD equation. This calculation has not been validated in all clinical situations. eGFR's persistently <60 mL/min signify possible Chronic Kidney Disease. 12/04/2009 1156   GFRAA  Value: >60        The eGFR has been calculated using the MDRD equation. This calculation has not been validated in all clinical 08/28/2006 0732   CMP     Component Value Date/Time   NA 138 12/04/2009 1156   K 3.3* 12/04/2009 1156   CL 103 12/04/2009 1156   CO2 27 12/04/2009 1156   GLUCOSE 99 12/04/2009 1156   BUN 9 12/04/2009 1156   CREATININE 0.68 12/04/2009 1156   CALCIUM 8.9 12/04/2009 1156   PROT 7.0 08/28/2006 0732   ALBUMIN 3.6  08/28/2006 0732   AST 19 08/28/2006 0732   ALT 16 08/28/2006 0732   ALKPHOS 61 08/28/2006 0732   BILITOT 0.3 08/28/2006 0732   GFRNONAA >60 12/04/2009 1156   GFRAA  Value: >60        The eGFR has been calculated using the MDRD equation. This calculation has not been validated in all clinical situations. eGFR's persistently <60 mL/min signify possible Chronic Kidney Disease. 12/04/2009 1156       Component Value Date/Time   WBC 5.3 12/04/2009 1156   WBC 8.1 08/28/2006 0732   HGB 11.0* 12/04/2009 1156   HGB 11.6* 08/28/2006 0732   HCT 33.6* 12/04/2009 1156   HCT 35.1* 08/28/2006 0732   MCV 81.2 12/04/2009 1156   MCV 81.9 08/28/2006 0732    Lipid Panel  No results found for this basename: chol, trig, hdl, cholhdl, vldl, ldlcalc    ABG No results found for this basename: phart, pco2, pco2art, po2, po2art, hco3, tco2, acidbasedef, o2sat     No results found for this basename: TSH   BNP (last 3  results) No results found for this basename: PROBNP,  in the last 8760 hours Cardiac Panel (last 3 results) No results found for this basename: CKTOTAL, CKMB, TROPONINI, RELINDX,  in the last 72 hours  Iron/TIBC/Ferritin No results found for this basename: iron, tibc, ferritin     EKG Orders placed in visit on 04/10/13  . EKG 12-LEAD     Prior Assessment and Plan Problem List as of 04/10/2013     Cardiovascular and Mediastinum   PVC's (premature ventricular contractions)   Last Assessment & Plan   11/16/2010 Office Visit Written 11/16/2010 11:44 AM by Satira Sark, MD     Reportedly noted during recent bicycle test in Dr. Cathey Endow office. Patient denies any dizziness or syncope, however has had a sense of palpitations in the last month. LVEF is not known.    Essential hypertension, benign   Last Assessment & Plan   04/15/2012 Office Visit Written 04/15/2012  2:20 PM by Lendon Colonel, NP     Blood pressure is not well-controlled currently for someone with diabetes. She is on samples of TriBenZor (Omnesartan 40 mg-amlodipine 5 mg-HCTZ 25 mg) which she is receiving samples from her primary care physician Dr. Berdine Addison. Optimal blood pressure control will have her systolic blood pressure in the 130s to 135 millimeters mercury. I have asked her to go by Dr. Cathey Endow office with an increase in her medication to include increased amlodipine to 5 mg if the bad sample strength is available. She verbalizes understanding      Endocrine   Type II or unspecified type diabetes mellitus without mention of complication, uncontrolled   Last Assessment & Plan   11/16/2010 Office Visit Written 11/16/2010 11:46 AM by Satira Sark, MD     Recently diagnosed, followed by Dr. Berdine Addison.      Other   Chest pain   Last Assessment & Plan   04/15/2012 Office Visit Written 04/15/2012  2:18 PM by Lendon Colonel, NP     Stress echocardiogram completed revealed no echocardiographic evidence for  stress-induced ischemia, EKG showed no diagnostic ST changes or inducible arrhythmias. The stress ECG was negative for ischemia. I explained this to her and she verbalizes understanding. She continues to be concerned about heartburn type symptoms along with feelings of food being stuck in her esophagus when she lies down. I started her on a low-dose H2 blocker Pepcid 20 mg daily.  She is to followup with her primary care physician for ongoing assessment and need to refer to GI at his discretion.    Shortness of breath   Last Assessment & Plan   04/15/2012 Office Visit Written 04/15/2012  2:21 PM by Lendon Colonel, NP     This can be multifactorial secondary to obesity, deconditioning, and questionable poor sleep hygiene. Further asses for her symptoms. For her symptoms. We will see her again in 6 months unless she becomes further symptomatic at which time we will have to plan a catheterization.sment by primary care physician in this setting is recommended. No cardiac etiology is noted        Imaging: No results found.

## 2013-04-10 NOTE — Patient Instructions (Addendum)
Your physician wants you to follow-up in: 1 year You will receive a reminder letter in the mail two months in advance. If you don't receive a letter, please call our office to schedule the follow-up appointment.   Your physician has recommended you make the following change in your medication:   Please START lisinopril/hctz 10/12.5 mg it will cost you $10 for 90 days   This replaces the Benicar HCT we first prescribed    Thank you for choosing [facility] !

## 2013-07-14 ENCOUNTER — Other Ambulatory Visit (HOSPITAL_COMMUNITY): Payer: Self-pay | Admitting: Family Medicine

## 2013-07-14 DIAGNOSIS — Z139 Encounter for screening, unspecified: Secondary | ICD-10-CM

## 2013-07-17 ENCOUNTER — Ambulatory Visit (HOSPITAL_COMMUNITY)
Admission: RE | Admit: 2013-07-17 | Discharge: 2013-07-17 | Disposition: A | Discharge status: Home / Self Care | Payer: BC Managed Care – PPO | Source: Ambulatory Visit | Attending: Family Medicine | Admitting: Family Medicine

## 2013-07-17 DIAGNOSIS — Z139 Encounter for screening, unspecified: Secondary | ICD-10-CM

## 2013-07-17 DIAGNOSIS — R928 Other abnormal and inconclusive findings on diagnostic imaging of breast: Secondary | ICD-10-CM | POA: Insufficient documentation

## 2013-07-17 DIAGNOSIS — Z1231 Encounter for screening mammogram for malignant neoplasm of breast: Secondary | ICD-10-CM | POA: Insufficient documentation

## 2013-07-24 ENCOUNTER — Other Ambulatory Visit: Payer: Self-pay | Admitting: Family Medicine

## 2013-07-24 DIAGNOSIS — R928 Other abnormal and inconclusive findings on diagnostic imaging of breast: Secondary | ICD-10-CM

## 2013-08-12 ENCOUNTER — Ambulatory Visit (HOSPITAL_COMMUNITY)
Admission: RE | Admit: 2013-08-12 | Discharge: 2013-08-12 | Disposition: A | Discharge status: Home / Self Care | Payer: BC Managed Care – PPO | Source: Ambulatory Visit | Attending: Family Medicine | Admitting: Family Medicine

## 2013-08-12 ENCOUNTER — Other Ambulatory Visit (HOSPITAL_COMMUNITY): Payer: Self-pay | Admitting: Family Medicine

## 2013-08-12 ENCOUNTER — Other Ambulatory Visit: Payer: Self-pay | Admitting: Family Medicine

## 2013-08-12 DIAGNOSIS — R928 Other abnormal and inconclusive findings on diagnostic imaging of breast: Secondary | ICD-10-CM

## 2013-08-12 DIAGNOSIS — N63 Unspecified lump in unspecified breast: Secondary | ICD-10-CM | POA: Insufficient documentation

## 2013-08-12 DIAGNOSIS — N6459 Other signs and symptoms in breast: Secondary | ICD-10-CM

## 2013-08-19 ENCOUNTER — Ambulatory Visit (HOSPITAL_COMMUNITY)
Admission: RE | Admit: 2013-08-19 | Discharge: 2013-08-19 | Disposition: A | Discharge status: Home / Self Care | Payer: BC Managed Care – PPO | Source: Ambulatory Visit | Attending: Family Medicine | Admitting: Family Medicine

## 2013-08-19 ENCOUNTER — Other Ambulatory Visit (HOSPITAL_COMMUNITY): Payer: Self-pay | Admitting: Family Medicine

## 2013-08-19 ENCOUNTER — Encounter (HOSPITAL_COMMUNITY): Payer: Self-pay

## 2013-08-19 DIAGNOSIS — N63 Unspecified lump in unspecified breast: Secondary | ICD-10-CM | POA: Insufficient documentation

## 2013-08-19 DIAGNOSIS — R229 Localized swelling, mass and lump, unspecified: Principal | ICD-10-CM

## 2013-08-19 DIAGNOSIS — IMO0002 Reserved for concepts with insufficient information to code with codable children: Secondary | ICD-10-CM

## 2013-08-19 DIAGNOSIS — N6459 Other signs and symptoms in breast: Secondary | ICD-10-CM

## 2013-08-19 MED ORDER — LIDOCAINE HCL (PF) 2 % IJ SOLN
10.0000 mL | Freq: Once | INTRAMUSCULAR | Status: AC
  Administered 2013-08-19: 10 mL

## 2013-08-19 MED ORDER — LIDOCAINE HCL (PF) 2 % IJ SOLN
INTRAMUSCULAR | Status: AC
  Administered 2013-08-19: 10 mL
  Filled 2013-08-19: qty 10

## 2013-08-19 NOTE — Discharge Instructions (Signed)
Breast Biopsy °Care After °These instructions give you information on caring for yourself after your procedure. Your doctor may also give you more specific instructions. Call your doctor if you have any problems or questions after your procedure. °HOME CARE °· Only take medicine as told by your doctor. °· Do not take aspirin. °· Keep your sutures (stitches) dry when bathing. °· Protect the biopsy area. Do not let the area get bumped. °· Avoid activities that could pull the biopsy site open until your doctor approves. This includes: °¨ Stretching. °¨ Reaching. °¨ Exercise. °¨ Sports. °¨ Lifting more than 3lb. °· Continue your normal diet. °· Wear a good support bra for as long as told by your doctor. °· Change any bandages (dressings) as told by your doctor. °· Do not drink alcohol while taking pain medicine. °· Keep all doctor visits as told. Ask when your test results will be ready. Make sure you get your test results. °GET HELP RIGHT AWAY IF:  °· You have a fever. °· You have more bleeding (more than a small spot) from the biopsy site. °· You have trouble breathing. °· You have yellowish-white fluid (pus) coming from the biopsy site. °· You have redness, puffiness (swelling), or more pain in the biopsy site. °· You have a bad smell coming from the biopsy site. °· Your biopsy site opens after sutures, staples, or sticky strips have been removed. °· You have a rash. °· You need stronger medicine. °MAKE SURE YOU: °· Understand these instructions. °· Will watch your condition. °· Will get help right away if you are not doing well or get worse. °Document Released: 11/12/2008 Document Revised: 04/10/2011 Document Reviewed: 02/26/2011 °ExitCare® Patient Information ©2015 [employer]. This information is not intended to replace advice given to you by your health care provider. Make sure you discuss any questions you have with your health care provider. ° °Breast Biopsy °A breast biopsy is a test during which a sample of  tissue is taken from your breast. The breast tissue is looked at under a microscope for cancer cells.  °BEFORE THE PROCEDURE °· Make plans to have someone drive you home after the test. °· Do not smoke for 2 weeks before the test. Stop smoking, if you smoke. °· Do not drink alcohol for 24 hours before the test. °· Wear a good support bra to the test. °PROCEDURE  °You may be given one of the following: °· A medicine to numb the breast area (local anesthesia). °· A medicine to make you sleep (general anesthesia). °There are different types of breast biopsies. They include: °· Fine-needle aspiration. °¨ A needle is put into the breast lump. °¨ The needle takes out fluid and cells from the lump. °¨ Ultrasound imaging may be used to help find the lump and to put the needle it the right spot. °· Core-needle biopsy. °¨ A needle is put into the breast lump. °¨ The needle is put in your breast 3-6 times. °¨ The needle removes breast tissue. °¨ An ultrasound image or X-ray is often used to find the right spot to put in the needle. °· Stereotactic biopsy. °¨ X-rays and a computer are used to study X-ray pictures of the breast lump. °¨ The computer finds where the needle needs to be put into the breast. °¨ Tissue samples are taken out. °· Vacuum-assisted biopsy. °¨ A small cut (incision) is made in your breast. °¨ A biopsy device is put through the cut and into the breast tissue. °¨   The biopsy device draws abnormal breast tissue into the biopsy device. °¨ A large tissue sample is often removed. °¨ No stitches are needed. °· Ultrasound-guided core-needle biopsy. °¨ Ultrasound imaging helps guide the needle into the area of the breast that is not normal. °¨ A cut is made in the breast. The needle is put in the needle. °¨ Tissue samples are taken out. °· Open biopsy. °¨ A large cut is made in the breast. °¨ Your doctor will try to remove the whole breast lump or as much as possible. °All tissue, fluid, or cell samples are looked  at under a microscope.  °AFTER THE PROCEDURE °· You will be taken to an area to recover. You will be able to go home once you are doing well and are without problems. °· You may have bruising on your breast. This is normal. °· A pressure bandage (dressing) may be put on your breast for 24-8 hours. This type of bandage is wrapped tightly around your chest. It helps stop fluid from building up underneath tissues. °Document Released: 04/10/2011 Document Reviewed: 04/10/2011 °ExitCare® Patient Information ©2015 [employer]. This information is not intended to replace advice given to you by your health care provider. Make sure you discuss any questions you have with your health care provider. ° °

## 2013-08-20 ENCOUNTER — Other Ambulatory Visit (HOSPITAL_COMMUNITY): Payer: BC Managed Care – PPO

## 2013-09-27 ENCOUNTER — Emergency Department (HOSPITAL_COMMUNITY): Payer: BC Managed Care – PPO

## 2013-09-27 ENCOUNTER — Encounter (HOSPITAL_COMMUNITY): Payer: Self-pay | Admitting: Emergency Medicine

## 2013-09-27 ENCOUNTER — Emergency Department (HOSPITAL_COMMUNITY)
Admission: EM | Admit: 2013-09-27 | Discharge: 2013-09-27 | Disposition: A | Discharge status: Home / Self Care | Payer: BC Managed Care – PPO | Attending: Emergency Medicine | Admitting: Emergency Medicine

## 2013-09-27 DIAGNOSIS — R51 Headache: Secondary | ICD-10-CM | POA: Insufficient documentation

## 2013-09-27 DIAGNOSIS — R079 Chest pain, unspecified: Secondary | ICD-10-CM | POA: Diagnosis present

## 2013-09-27 DIAGNOSIS — Z8719 Personal history of other diseases of the digestive system: Secondary | ICD-10-CM | POA: Insufficient documentation

## 2013-09-27 DIAGNOSIS — E119 Type 2 diabetes mellitus without complications: Secondary | ICD-10-CM | POA: Diagnosis not present

## 2013-09-27 DIAGNOSIS — Z86718 Personal history of other venous thrombosis and embolism: Secondary | ICD-10-CM | POA: Diagnosis not present

## 2013-09-27 DIAGNOSIS — Z79899 Other long term (current) drug therapy: Secondary | ICD-10-CM | POA: Insufficient documentation

## 2013-09-27 DIAGNOSIS — Z791 Long term (current) use of non-steroidal anti-inflammatories (NSAID): Secondary | ICD-10-CM | POA: Diagnosis not present

## 2013-09-27 DIAGNOSIS — R0789 Other chest pain: Secondary | ICD-10-CM | POA: Insufficient documentation

## 2013-09-27 DIAGNOSIS — Z7982 Long term (current) use of aspirin: Secondary | ICD-10-CM | POA: Insufficient documentation

## 2013-09-27 DIAGNOSIS — I10 Essential (primary) hypertension: Secondary | ICD-10-CM | POA: Diagnosis not present

## 2013-09-27 LAB — CBC WITH DIFFERENTIAL/PLATELET
Basophils Absolute: 0 10*3/uL (ref 0.0–0.1)
Basophils Relative: 0 % (ref 0–1)
EOS ABS: 0.1 10*3/uL (ref 0.0–0.7)
Eosinophils Relative: 2 % (ref 0–5)
HCT: 33 % — ABNORMAL LOW (ref 36.0–46.0)
Hemoglobin: 10.6 g/dL — ABNORMAL LOW (ref 12.0–15.0)
Lymphocytes Relative: 32 % (ref 12–46)
Lymphs Abs: 1.7 10*3/uL (ref 0.7–4.0)
MCH: 24.4 pg — AB (ref 26.0–34.0)
MCHC: 32.1 g/dL (ref 30.0–36.0)
MCV: 76 fL — ABNORMAL LOW (ref 78.0–100.0)
MONOS PCT: 7 % (ref 3–12)
Monocytes Absolute: 0.4 10*3/uL (ref 0.1–1.0)
Neutro Abs: 3.1 10*3/uL (ref 1.7–7.7)
Neutrophils Relative %: 59 % (ref 43–77)
Platelets: 248 10*3/uL (ref 150–400)
RBC: 4.34 MIL/uL (ref 3.87–5.11)
RDW: 16.4 % — ABNORMAL HIGH (ref 11.5–15.5)
WBC: 5.4 10*3/uL (ref 4.0–10.5)

## 2013-09-27 LAB — BASIC METABOLIC PANEL
Anion gap: 13 (ref 5–15)
BUN: 12 mg/dL (ref 6–23)
CO2: 25 mEq/L (ref 19–32)
Calcium: 8.9 mg/dL (ref 8.4–10.5)
Chloride: 102 mEq/L (ref 96–112)
Creatinine, Ser: 0.63 mg/dL (ref 0.50–1.10)
GFR calc Af Amer: >90 mL/min (ref >90)
GLUCOSE: 97 mg/dL (ref 70–99)
Potassium: 3.4 mEq/L — ABNORMAL LOW (ref 3.7–5.3)
Sodium: 140 mEq/L (ref 137–147)

## 2013-09-27 LAB — TROPONIN I: Troponin I: <0.3 ng/mL (ref <0.30)

## 2013-09-27 NOTE — Discharge Instructions (Signed)
Chest Pain (Nonspecific) There is no evidence of heart attack. Follow up with your doctor for a stress test. Return to the ED if you develop new or worsening symptoms. It is often hard to give a specific diagnosis for the cause of chest pain. There is always a chance that your pain could be related to something serious, such as a heart attack or a blood clot in the lungs. You need to follow up with your health care provider for further evaluation. CAUSES   Heartburn.  Pneumonia or bronchitis.  Anxiety or stress.  Inflammation around your heart (pericarditis) or lung (pleuritis or pleurisy).  A blood clot in the lung.  A collapsed lung (pneumothorax). It can develop suddenly on its own (spontaneous pneumothorax) or from trauma to the chest.  Shingles infection (herpes zoster virus). The chest wall is composed of bones, muscles, and cartilage. Any of these can be the source of the pain.  The bones can be bruised by injury.  The muscles or cartilage can be strained by coughing or overwork.  The cartilage can be affected by inflammation and become sore (costochondritis). DIAGNOSIS  Lab tests or other studies may be needed to find the cause of your pain. Your health care provider may have you take a test called an ambulatory electrocardiogram (ECG). An ECG records your heartbeat patterns over a 24-hour period. You may also have other tests, such as:  Transthoracic echocardiogram (TTE). During echocardiography, sound waves are used to evaluate how blood flows through your heart.  Transesophageal echocardiogram (TEE).  Cardiac monitoring. This allows your health care provider to monitor your heart rate and rhythm in real time.  Holter monitor. This is a portable device that records your heartbeat and can help diagnose heart arrhythmias. It allows your health care provider to track your heart activity for several days, if needed.  Stress tests by exercise or by giving medicine that makes  the heart beat faster. TREATMENT   Treatment depends on what may be causing your chest pain. Treatment may include:  Acid blockers for heartburn.  Anti-inflammatory medicine.  Pain medicine for inflammatory conditions.  Antibiotics if an infection is present.  You may be advised to change lifestyle habits. This includes stopping smoking and avoiding alcohol, caffeine, and chocolate.  You may be advised to keep your head raised (elevated) when sleeping. This reduces the chance of acid going backward from your stomach into your esophagus. Most of the time, nonspecific chest pain will improve within 2-3 days with rest and mild pain medicine.  HOME CARE INSTRUCTIONS   If antibiotics were prescribed, take them as directed. Finish them even if you start to feel better.  For the next few days, avoid physical activities that bring on chest pain. Continue physical activities as directed.  Do not use any tobacco products, including cigarettes, chewing tobacco, or electronic cigarettes.  Avoid drinking alcohol.  Only take medicine as directed by your health care provider.  Follow your health care provider's suggestions for further testing if your chest pain does not go away.  Keep any follow-up appointments you made. If you do not go to an appointment, you could develop lasting (chronic) problems with pain. If there is any problem keeping an appointment, call to reschedule. SEEK MEDICAL CARE IF:   Your chest pain does not go away, even after treatment.  You have a rash with blisters on your chest.  You have a fever. SEEK IMMEDIATE MEDICAL CARE IF:   You have increased chest pain  or pain that spreads to your arm, neck, jaw, back, or abdomen.  You have shortness of breath.  You have an increasing cough, or you cough up blood.  You have severe back or abdominal pain.  You feel nauseous or vomit.  You have severe weakness.  You faint.  You have chills. This is an emergency.  Do not wait to see if the pain will go away. Get medical help at once. Call your local emergency services (911 in [place] Do not drive yourself to the hospital. MAKE SURE YOU:   Understand these instructions.  Will watch your condition.  Will get help right away if you are not doing well or get worse. Document Released: 10/26/2004 Document Revised: 01/21/2013 Document Reviewed: 08/22/2007 [facility] Patient Information 2015 [place], [place]. This information is not intended to replace advice given to you by your health care provider. Make sure you discuss any questions you have with your health care provider.

## 2013-09-27 NOTE — ED Notes (Signed)
Pt reports intermittent chest pain since Wednesday. Pt reports pain is worse with left arm movement. Pt denies any n/v,sob. Pt non-diaphoretic in triage.

## 2013-09-27 NOTE — ED Provider Notes (Signed)
CSN: [id number]     Arrival date & time 09/27/13  1234 History  This chart was scribed for Glynn Octave, MD by Elon Spanner, ED Scribe. This patient was seen in room APA12/APA12 and the patient's care was started at 1:45 PM.   Chief Complaint  Patient presents with  . Chest Pain   The history is provided by the patient. No language interpreter was used.    HPI Comments: Melissa Casey is a 48 y.o. female with a history of DM, HTN, DVT who presents to the Emergency Department complaining of intermittent left-sided chest tingling described as "feels like indigestion" onset two days ago with radiation down left arm.  She states the episodes last less than 1 minute and complains of associated intermittent, concurrent left hand numbness/tingling. Numbness in L thumb and forearm x4 days but does not involve upper arm. She also states she has a constant headache, 1 episode of diaphoresis today, and current nausea.  She reports an increased sluggishness described as "tired" and "sleepy" when she has been at work the last two days.   She denies a previous history of similar episodes.  Patient denies history of MI, stenting.  Patient denies use of anticoagulants.  Patient denies SOB, cough, fevers, abdominal pain, dizziness.   Past Medical History  Diagnosis Date  . Essential hypertension, benign   . IBS (irritable bowel syndrome)   . Premature ventricular contractions (PVCs) (VPCs)     Per Dr. Loleta Chance  . Type 2 diabetes mellitus   . DVT (deep venous thrombosis)    History reviewed. No pertinent past surgical history. Family History  Problem Relation Age of Onset  . Coronary artery disease Mother   . Coronary artery disease Father     Died age 18  . Hypertension Brother   . Arrhythmia Sister    History  Substance Use Topics  . Smoking status: Never Smoker   . Smokeless tobacco: Never Used  . Alcohol Use: No   OB History   Grav Para Term Preterm Abortions TAB SAB Ect Mult Living                  Review of Systems  A complete 10 system review of systems was obtained and all systems are negative except as noted in the HPI and PMH.   Allergies  Review of patient's allergies indicates no known allergies.  Home Medications   Prior to Admission medications   Medication Sig Start Date End Date Taking? Authorizing Provider  aspirin 81 MG tablet Take 81 mg by mouth daily.     Yes Historical Provider, MD  ibuprofen (ADVIL,MOTRIN) 800 MG tablet Take 800 mg by mouth 2 (two) times daily.     Yes Historical Provider, MD  lisinopril-hydrochlorothiazide (PRINZIDE,ZESTORETIC) 10-12.5 MG per tablet Take 1 tablet by mouth daily.   Yes Historical Provider, MD  Saxagliptin-Metformin (KOMBIGLYZE XR) 2.05-998 MG TB24 Take 1 tablet by mouth.   Yes Historical Provider, MD   BP 171/84  Pulse 71  Temp(Src) 98.3 F (36.8 C) (Oral)  Resp 16  Ht  (1.727 m)  Wt 270 lb (122.471 kg)  BMI 41.06 kg/m2  SpO2 98% Physical Exam  Nursing note and vitals reviewed. Constitutional: She is oriented to person, place, and time. She appears well-developed and well-nourished. No distress.  HENT:  Head: Normocephalic and atraumatic.  Mouth/Throat: Oropharynx is clear and moist. No oropharyngeal exudate.  Eyes: Conjunctivae and EOM are normal. Pupils are equal, round, and reactive to  light.  Neck: Normal range of motion. Neck supple.  No meningismus.  Cardiovascular: Normal rate, regular rhythm, normal heart sounds and intact distal pulses.   No murmur heard. Pulmonary/Chest: Effort normal and breath sounds normal. No respiratory distress. She exhibits no tenderness.  Chest wall non tender.   Abdominal: Soft. There is no tenderness. There is no rebound and no guarding.  Musculoskeletal: Normal range of motion. She exhibits no edema and no tenderness.  Neurological: She is alert and oriented to person, place, and time. No cranial nerve deficit. She exhibits normal muscle tone. Coordination normal.   No ataxia on finger to nose bilaterally. No pronator drift. 5/5 strength throughout. CN 2-12 intact. Negative Romberg. Equal grip strength. Sensation intact. Gait is normal.  Decreased sensation in L thumb, index, wrist  Skin: Skin is warm.  Psychiatric: She has a normal mood and affect. Her behavior is normal.    ED Course  Procedures (including critical care time)  DIAGNOSTIC STUDIES: Oxygen Saturation is 99% on RA, normal by my interpretation.    COORDINATION OF CARE:    Labs Review Labs Reviewed  CBC WITH DIFFERENTIAL - Abnormal; Notable for the following:    Hemoglobin 10.6 (*)    HCT 33.0 (*)    MCV 76.0 (*)    MCH 24.4 (*)    RDW 16.4 (*)    All other components within normal limits  BASIC METABOLIC PANEL - Abnormal; Notable for the following:    Potassium 3.4 (*)    All other components within normal limits  TROPONIN I  TROPONIN I    Imaging Review Ct Head Wo Contrast  09/27/2013   CLINICAL DATA:  Headache.  EXAM: CT HEAD WITHOUT CONTRAST  TECHNIQUE: Contiguous axial images were obtained from the base of the skull through the vertex without intravenous contrast.  COMPARISON:  04/13/2005.  FINDINGS: No intra-axial or extra-axial pathologic fluid or blood collection. No mass. No hydrocephalus. Enlargement of the sella turcica noted. This suggests at the sella syndrome. Nonemergent MRI of the brain can be obtained to further evaluate. No acute bony abnormality.  IMPRESSION: 1.  No acute abnormality.  2. Enlargement of the sella turcica. This may be secondary to empty sella syndrome. Nonemergent MRI the brain can be obtained to further evaluate .   Electronically Signed   By: Maisie Fus  Register   On: 09/27/2013 14:55   Dg Chest Portable 1 View  09/27/2013   CLINICAL DATA:  Chest pain  EXAM: PORTABLE CHEST - 1 VIEW  COMPARISON:  None.  FINDINGS: The heart size and mediastinal contours are within normal limits. Both lungs are clear. The visualized skeletal structures are  unremarkable.  IMPRESSION: No active disease.   Electronically Signed   By: Alcide Clever M.D.   On: 09/27/2013 13:14     EKG Interpretation   Date/Time:  Saturday September 27 2013 13:11:06 EDT Ventricular Rate:  73 PR Interval:  191 QRS Duration: 109 QT Interval:  406 QTC Calculation: 447 R Axis:   34 Text Interpretation:  Sinus rhythm Low voltage, precordial leads No  significant change was found Confirmed by Manus Gunning  MD, Michai Dieppa [phone]) on  09/27/2013 4:26:14 PM      MDM   Final diagnoses:  Atypical chest pain   Patient complains of intermittent tingling sensation in the left side of her chest and arm for the past 3 days. Feeling comes and goes lasting a few seconds at a time. Contrary to triage note she has no chest pain.  Denies any nausea, vomiting shortness of breath. Denies any difficulty speaking or swallowing.  No focal weakness. She describes a tingling sensation that feels like "indigestion" that resolved after a few seconds.   Constant tingling in left hand and wrist for the past 4 days. Equal strength throughout. Unlikely to be TIA or CVA given lack of other neuro symptoms.  ABCD2 score is 1.   Chest discomfort is atypical for ACS. Last just a few seconds at a time. No shortness of breath, nausea or vomiting. No hypoxia or tachycardia to suggest PE. Pain description quite atypical for PE despite DVT history. Troponin negative x2.  Discuss outpatient followup with her PCP for stress test.   Glynn Octave, MD 09/27/13 [phone]

## 2014-02-02 ENCOUNTER — Other Ambulatory Visit (HOSPITAL_COMMUNITY): Payer: Self-pay | Admitting: Family Medicine

## 2014-02-02 DIAGNOSIS — Z09 Encounter for follow-up examination after completed treatment for conditions other than malignant neoplasm: Secondary | ICD-10-CM

## 2014-02-24 ENCOUNTER — Ambulatory Visit (HOSPITAL_COMMUNITY)
Admission: RE | Admit: 2014-02-24 | Discharge: 2014-02-24 | Disposition: A | Discharge status: Home / Self Care | Payer: BLUE CROSS/BLUE SHIELD | Source: Ambulatory Visit | Attending: Family Medicine | Admitting: Family Medicine

## 2014-02-24 DIAGNOSIS — Z09 Encounter for follow-up examination after completed treatment for conditions other than malignant neoplasm: Secondary | ICD-10-CM

## 2014-02-24 DIAGNOSIS — N63 Unspecified lump in breast: Secondary | ICD-10-CM | POA: Insufficient documentation

## 2014-04-10 ENCOUNTER — Encounter: Payer: Self-pay | Admitting: Cardiology

## 2014-04-10 ENCOUNTER — Ambulatory Visit (INDEPENDENT_AMBULATORY_CARE_PROVIDER_SITE_OTHER): Payer: BLUE CROSS/BLUE SHIELD | Admitting: Cardiology

## 2014-04-10 ENCOUNTER — Encounter: Payer: Self-pay | Admitting: *Deleted

## 2014-04-10 VITALS — BP 142/88 | HR 79 | Ht 68.0 in | Wt 273.4 lb

## 2014-04-10 DIAGNOSIS — I10 Essential (primary) hypertension: Secondary | ICD-10-CM | POA: Diagnosis not present

## 2014-04-10 DIAGNOSIS — R002 Palpitations: Secondary | ICD-10-CM | POA: Diagnosis not present

## 2014-04-10 DIAGNOSIS — Z136 Encounter for screening for cardiovascular disorders: Secondary | ICD-10-CM | POA: Diagnosis not present

## 2014-04-10 MED ORDER — OLMESARTAN-AMLODIPINE-HCTZ 40-10-25 MG PO TABS: 1.0000 | ORAL_TABLET | Freq: Every day | ORAL | Status: DC

## 2014-04-10 NOTE — Progress Notes (Signed)
Clinical Summary Ms. Rolley SimsHampton is a 49 y.o.female last seen by NP Lawerence, this is our first visit together. She is seen for the following medical problems.  1. Palpitations - can have occasional fluttering feeling. Lasts approx 10-15 minutes. Occurs on average every 2 days.  - no coffee, iced tea 4 times a month, occas sodas, no energy, no EtoH.   2. HTN - does not check regularly - has not taken bp meds x 4 days  - limits sodium intake  3. Atypical chest pain - negative stress echo - from notes thought to be related to GERD - still with occassional mild symptoms  Past Medical History  Diagnosis Date  . Essential hypertension, benign   . IBS (irritable bowel syndrome)   . Premature ventricular contractions (PVCs) (VPCs)     Per Dr. Loleta ChanceHill  . Type 2 diabetes mellitus   . DVT (deep venous thrombosis)      No Known Allergies   Current Outpatient Prescriptions  Medication Sig Dispense Refill  . aspirin 81 MG tablet Take 81 mg by mouth daily.      Marland Kitchen. ibuprofen (ADVIL,MOTRIN) 800 MG tablet Take 800 mg by mouth 2 (two) times daily.      Marland Kitchen. lisinopril-hydrochlorothiazide (PRINZIDE,ZESTORETIC) 10-12.5 MG per tablet Take 1 tablet by mouth daily.    . Saxagliptin-Metformin (KOMBIGLYZE XR) 2.05-998 MG TB24 Take 1 tablet by mouth.     No current facility-administered medications for this visit.     No past surgical history on file.   No Known Allergies    Family History  Problem Relation Age of Onset  . Coronary artery disease Mother   . Coronary artery disease Father     Died age 49  . Hypertension Brother   . Arrhythmia Sister      Social History Ms. Rolley SimsHampton reports that she has never smoked. She has never used smokeless tobacco. Ms. Rolley SimsHampton reports that she does not drink alcohol.   Review of Systems CONSTITUTIONAL: No weight loss, fever, chills, weakness or fatigue.  HEENT: Eyes: No visual loss, blurred vision, double vision or yellow sclerae.No hearing  loss, sneezing, congestion, runny nose or sore throat.  SKIN: No rash or itching.  CARDIOVASCULAR: per HPI RESPIRATORY: No shortness of breath, cough or sputum.  GASTROINTESTINAL: No anorexia, nausea, vomiting or diarrhea. No abdominal pain or blood.  GENITOURINARY: No burning on urination, no polyuria NEUROLOGICAL: No headache, dizziness, syncope, paralysis, ataxia, numbness or tingling in the extremities. No change in bowel or bladder control.  MUSCULOSKELETAL: No muscle, back pain, joint pain or stiffness.  LYMPHATICS: No enlarged nodes. No history of splenectomy.  PSYCHIATRIC: No history of depression or anxiety.  ENDOCRINOLOGIC: No reports of sweating, cold or heat intolerance. No polyuria or polydipsia.  Marland Kitchen.   Physical Examination p 79 bp 142/88 Wt 273 lbs BMI 42 Gen: resting comfortably, no acute distress HEENT: no scleral icterus, pupils equal round and reactive, no palptable cervical adenopathy,  CV: RRR, no m/r/g, no JVD Resp: Clear to auscultation bilaterally GI: abdomen is soft, non-tender, non-distended, normal bowel sounds, no hepatosplenomegaly MSK: extremities are warm, no edema.  Skin: warm, no rash Neuro:  no focal deficits Psych: appropriate affect   Diagnostic Studies 10/2010 Stress echo Study Conclusions  - Stress ECG conclusions: The stress ECG was negative for ischemia. - Staged echo: There was no echocardiographic evidence for stress-induced ischemia.    Assessment and Plan   1. Palpitations - will obtain 7 day event monitor  2. HTN - will refill current meds  3. Chest pain - recent negative stress echo, symptoms most consistent with GI etiology. COntinue antacid   F/u 3-4 weeks  Antoine Poche, M.D.

## 2014-04-10 NOTE — Patient Instructions (Signed)
Your physician recommends that you schedule a follow-up appointment in: 3-4 weeks   Your physician has recommended that you wear an event monitor. Event monitors are medical devices that record the heart's electrical activity. Doctors most often us these monitors to diagnose arrhythmias. Arrhythmias are problems with the speed or rhythm of the heartbeat. The monitor is a small, portable device. You can wear one while you do your normal daily activities. This is usually used to diagnose what is causing palpitations/syncope (passing out).  Your physician recommends that you continue on your current medications as directed. Please refer to the Current Medication list given to you today.  Thank you for choosing  [facility]!

## 2014-04-14 ENCOUNTER — Telehealth: Payer: Self-pay

## 2014-04-14 NOTE — Telephone Encounter (Signed)
-----   Message from Antoine PocheJonathan F Branch, MD sent at 04/14/2014  2:15 PM EDT ----- Regarding: RE: Insurance won't cover Tribenzor Patient had been receiving samples from her pcp. Please have her check w/ pcp to see if these are available, if not we will provide an alternative  Dominga FerryJ Branch MD ----- Message -----    From: Nori Riisatherine A Adaysha Dubinsky, RN    Sent: 04/14/2014   7:53 AM      To: Antoine PocheJonathan F Branch, MD Subject: Insurance won't cover Tribenzor                Tribenzor 40-10-25 rejected by pt's insurance as non formulary.Do you have another medication you would like?

## 2014-04-14 NOTE — Telephone Encounter (Signed)
LMTCB

## 2014-04-20 ENCOUNTER — Telehealth: Payer: Self-pay

## 2014-04-20 ENCOUNTER — Telehealth: Payer: Self-pay | Admitting: Cardiology

## 2014-04-20 MED ORDER — LISINOPRIL 20 MG PO TABS: 20.0000 mg | ORAL_TABLET | Freq: Every day | ORAL | Status: DC

## 2014-04-20 MED ORDER — HYDROCHLOROTHIAZIDE 25 MG PO TABS: 25.0000 mg | ORAL_TABLET | Freq: Every day | ORAL | Status: DC

## 2014-04-20 NOTE — Telephone Encounter (Signed)
Please see refill bin / tg  °

## 2014-04-20 NOTE — Telephone Encounter (Signed)
Please have her stop tribenzor. Start on lisinopril 20mg  daily and HCTZ 25mg  daily. Please ask if she has a bp cuff at home to monitor, if so to call us back early next week with bp numbers. If not then bp check in 2 weeks  Dominga FerryJ Luvada Salamone MD

## 2014-04-20 NOTE — Telephone Encounter (Signed)
See previous phone note,medication will cost pt over $340/ month

## 2014-04-20 NOTE — Telephone Encounter (Signed)
Pt returned event monitor to dept but left charger and box at home.LM to please return both so she would not incur a charge from [employer]

## 2014-04-20 NOTE — Telephone Encounter (Signed)
Tribenzor approved  Through 3/21 17   PA number [id number] through  For $340/month Pt cannot afford

## 2014-04-20 NOTE — Telephone Encounter (Signed)
Started to speak with pt and call was dropped.She works in the [employer] and cannot carry her phone.Will await her call back

## 2014-04-20 NOTE — Telephone Encounter (Signed)
Antoine PocheJonathan F Branch, MD at 04/20/2014 1:48 PM     Status: Signed       Expand All Collapse All   Please have her stop tribenzor. Start on lisinopril 20mg  daily and HCTZ 25mg  daily. Please ask if she has a bp cuff at home to monitor, if so to call us back early next week with bp numbers. If not then bp check in 2 weeks  Dominga FerryJ Branch MD

## 2014-04-20 NOTE — Addendum Note (Signed)
Addended by: Marlyn CorporalARLTON, Julita Ozbun A on: 04/20/2014 04:37 PM   Modules accepted: Orders

## 2014-04-30 ENCOUNTER — Other Ambulatory Visit: Payer: Self-pay | Admitting: *Deleted

## 2014-04-30 DIAGNOSIS — R002 Palpitations: Secondary | ICD-10-CM

## 2014-05-01 ENCOUNTER — Encounter: Payer: Self-pay | Admitting: Adult Health

## 2014-05-01 ENCOUNTER — Ambulatory Visit (INDEPENDENT_AMBULATORY_CARE_PROVIDER_SITE_OTHER): Payer: BLUE CROSS/BLUE SHIELD | Admitting: Adult Health

## 2014-05-01 VITALS — BP 160/96 | HR 84 | Ht 69.0 in | Wt 276.0 lb

## 2014-05-01 DIAGNOSIS — I10 Essential (primary) hypertension: Secondary | ICD-10-CM

## 2014-05-01 MED ORDER — LISINOPRIL-HYDROCHLOROTHIAZIDE 20-25 MG PO TABS: 1.0000 | ORAL_TABLET | Freq: Every day | ORAL | Status: DC

## 2014-05-01 MED ORDER — SPIRONOLACTONE 25 MG PO TABS: 12.5000 mg | ORAL_TABLET | Freq: Every day | ORAL | Status: DC

## 2014-05-01 NOTE — Progress Notes (Signed)
Cardiology Office Note   Date:  05/01/2014   ID:  Melissa LanaCassandra N Weathers, DOB 1965/07/31, MRN [id number]  PCP:  Evlyn CourierHILL,GERALD K, MD  Cardiologist:Branch/ Joni ReiningKathryn Lawrence, NP   Chief Complaint  Patient presents with  . Palpitations  . Hypertension  . Chest Pain    Atypical      History of Present Illness: Melissa Casey is a 49 y.o. female who presents for andhypertension.  She also has a history of palpitations, atypical chest pain.  The patient had a monitor placed to evaluate frequency of palpitations, but did not wear long as it was beating a lot, and therefore, she returned it.  The patient states that she had no further palpitations since.  However, she has been noticing her blood pressure remains elevated.  Blood pressure ranging 160/90, 152/81, 159/72.  The patient is under some stress.  Right now, as she is preparing to attack her son go off to college.  He is her only child and is causing some emotional stress for her.  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  She states that she does not sit around much at work.  When she goes home she rests a lot.  She admits to eating salty foods, fast food, [place] fried chicken, is her favorite.  She has been using samples of Tribenzor but has run out.  The blood pressure readings are on that medication.  She has been taking a lot of ibuprofen for headaches and chronic pain.    Past Medical History  Diagnosis Date  . Essential hypertension, benign   . IBS (irritable bowel syndrome)   . Premature ventricular contractions (PVCs) (VPCs)     Per Dr. Loleta ChanceHill  . Type 2 diabetes mellitus   . DVT (deep venous thrombosis)     History reviewed. No pertinent past surgical history.   Current Outpatient Prescriptions  Medication Sig Dispense Refill  . aspirin 81 MG tablet Take 81 mg by mouth daily.      . hydrochlorothiazide (HYDRODIURIL) 25 MG tablet Take 1 tablet (25 mg total) by mouth daily. 90 tablet 3  .  ibuprofen (ADVIL,MOTRIN) 800 MG tablet Take 800 mg by mouth 2 (two) times daily.      . Olmesartan-Amlodipine-HCTZ (TRIBENZOR) 40-10-25 MG TABS Take 1 tablet by mouth daily. 90 tablet 3  . lisinopril-hydrochlorothiazide (PRINZIDE,ZESTORETIC) 20-25 MG per tablet Take 1 tablet by mouth daily. 90 tablet 3  . spironolactone (ALDACTONE) 25 MG tablet Take 0.5 tablets (12.5 mg total) by mouth daily. 45 tablet 3   No current facility-administered medications for this visit.    Allergies:   Review of patient's allergies indicates no known allergies.    Social History:  The patient  reports that she has never smoked. She has never used smokeless tobacco. She reports that she does not drink alcohol or use illicit drugs.   Family History:  The patient's family history includes Arrhythmia in her sister; Coronary artery disease in her father and mother; Hypertension in her brother.    ROS: .   All other systems are reviewed and negative.Unless otherwise mentioned in H&P above.   PHYSICAL EXAM: VS:  BP 160/96 mmHg  Pulse 84  Ht 5\' 9"  (1.753 m)  Wt 276 lb (125.193 kg)  BMI 40.74 kg/m2  SpO2 99% , BMI Body mass index is 40.74 kg/(m^2). GEN: Well nourished, well developed, in no acute distress HEENT: normal Neck: no JVD, carotid bruits, or  masses Cardiac: RRR;  Positive for S4 murmur, rubs, or gallops,no edema  Respiratory:  clear to auscultation bilaterally, normal work of breathing GI: soft, nontender, nondistended, + BS, obese MS: no deformity or atrophy Skin: warm and dry, no rash Neuro:  Strength and sensation are intact Psych: euthymic mood, full affect   Recent Labs: 09/27/2013: BUN 12; Creatinine 0.63; Hemoglobin 10.6*; Platelets 248; Potassium 3.4*; Sodium 140    Lipid Panel No results found for: CHOL, TRIG, HDL, CHOLHDL, VLDL, LDLCALC, LDLDIRECT    Wt Readings from Last 3 Encounters:  05/01/14 276 lb (125.193 kg)  04/10/14 273 lb 6.4 oz (124.013 kg)  09/27/13 270 lb (122.471  kg)      Other studies Reviewed: Additional studies/ records that were reviewed today include: None    ASSESSMENT AND PLAN:  1. Hypertension: , Difficult to control.  She has not been tested for sleep apnea, although she does have body habitus for same.this can be considered on followup.  We do not have any samples of tribenszor.  I will begin her on lisinopril 20/HCTZ 25 mg, as these are found to [facility] pharmacy and are less expensive.  We will also start her on spironolactone 12.5 mg daily.  Lab BMET drawn in 2 weeks.  The patient will continue to keep a record of her blood pressures.  We will see her again in one month.  If blood pressures not better control.  Can consider adding back amlodipine.  Will also need to revisit evaluation for OSA.she is also been advised to take Tylenol instead of ibuprofen as this will effect her kidney function, and blood pressure.   Current medicines are reviewed at length with the patient today.    Labs/ tests ordered today include: BMET 2 weeks.   Orders Placed This Encounter  Procedures  . Basic Metabolic Panel (BMET)     Disposition:   FU with 1 month Signed, Joni Reining, NP  05/01/2014 2:17 PM    [facility] [address], [place], [place] [address] Phone: [phone]; Fax: [phone]

## 2014-05-01 NOTE — Patient Instructions (Addendum)
Your physician recommends that you schedule a follow-up appointment in: 1 month  Your physician recommends that you return for lab work in: 2 weeks [facility]( [facility]   Your physician has recommended you make the following change in your medication:   Start Lisinopril-Hydroclorothiazide 20-25 mg Daily, and Spironolactone 25 mg Daily  Your physician has requested that you regularly monitor and record your blood pressure readings at home. Please use the same machine at the same time of day to check your readings and record them to bring to your follow-up visit.  Thank you for choosing [facility]!

## 2014-05-01 NOTE — Progress Notes (Deleted)
Name: Melissa Casey    DOB: 05/03/1965  Age: 49 y.o.  MR#: [id number]       PCP:  Maggie Font, MD      Insurance: Payor: [place] / Plan: BCBS OTHER / Product Type: *No Product type* /   CC:    Chief Complaint  Patient presents with  . Palpitations  . Hypertension  . Chest Pain    Atypical    VS Filed Vitals:   05/01/14 1312  BP: 160/96  Pulse: 84  Height: 5' 9" (1.753 m)  Weight: 276 lb (125.193 kg)  SpO2: 99%    Weights Current Weight  05/01/14 276 lb (125.193 kg)  04/10/14 273 lb 6.4 oz (124.013 kg)  09/27/13 270 lb (122.471 kg)    Blood Pressure  BP Readings from Last 3 Encounters:  05/01/14 160/96  04/10/14 142/88  09/27/13 175/86     Admit date:  (Not on file) Last encounter with RMR:  Visit date not found   Allergy Review of patient's allergies indicates no known allergies.  Current Outpatient Prescriptions  Medication Sig Dispense Refill  . aspirin 81 MG tablet Take 81 mg by mouth daily.      . hydrochlorothiazide (HYDRODIURIL) 25 MG tablet Take 1 tablet (25 mg total) by mouth daily. 90 tablet 3  . ibuprofen (ADVIL,MOTRIN) 800 MG tablet Take 800 mg by mouth 2 (two) times daily.      . Olmesartan-Amlodipine-HCTZ (TRIBENZOR) 40-10-25 MG TABS Take 1 tablet by mouth daily. 90 tablet 3  . lisinopril (PRINIVIL,ZESTRIL) 20 MG tablet Take 1 tablet (20 mg total) by mouth daily. 90 tablet 3   No current facility-administered medications for this visit.    Discontinued Meds:   There are no discontinued medications.  Patient Active Problem List   Diagnosis Date Noted  . Chest pain 11/16/2010  . Shortness of breath 11/16/2010  . PVC's (premature ventricular contractions) 11/16/2010  . Essential hypertension, benign 11/16/2010  . Type II or unspecified type diabetes mellitus without mention of complication, uncontrolled 11/16/2010    LABS    Component Value Date/Time   NA 140 09/27/2013 1257   NA 138 12/04/2009 1156   NA 136  08/28/2006 0732   K 3.4* 09/27/2013 1257   K 3.3* 12/04/2009 1156   K 3.6 08/28/2006 0732   CL 102 09/27/2013 1257   CL 103 12/04/2009 1156   CL 108 08/28/2006 0732   CO2 25 09/27/2013 1257   CO2 27 12/04/2009 1156   CO2 22 08/28/2006 0732   GLUCOSE 97 09/27/2013 1257   GLUCOSE 99 12/04/2009 1156   GLUCOSE 136* 08/28/2006 0732   BUN 12 09/27/2013 1257   BUN 9 12/04/2009 1156   BUN 15 08/28/2006 0732   CREATININE 0.63 09/27/2013 1257   CREATININE 0.68 12/04/2009 1156   CREATININE 1.05 08/28/2006 0732   CALCIUM 8.9 09/27/2013 1257   CALCIUM 8.9 12/04/2009 1156   CALCIUM 8.9 08/28/2006 0732   GFRNONAA >90 09/27/2013 1257   GFRNONAA >60 12/04/2009 1156   GFRNONAA 58* 08/28/2006 0732   GFRAA >90 09/27/2013 1257   GFRAA  12/04/2009 1156    >60        The eGFR has been calculated using the MDRD equation. This calculation has not been validated in all clinical situations. eGFR's persistently <60 mL/min signify possible Chronic Kidney Disease.   GFRAA  08/28/2006 0732    >60        The eGFR has been calculated using the  MDRD equation. This calculation has not been validated in all clinical   CMP     Component Value Date/Time   NA 140 09/27/2013 1257   K 3.4* 09/27/2013 1257   CL 102 09/27/2013 1257   CO2 25 09/27/2013 1257   GLUCOSE 97 09/27/2013 1257   BUN 12 09/27/2013 1257   CREATININE 0.63 09/27/2013 1257   CALCIUM 8.9 09/27/2013 1257   PROT 7.0 08/28/2006 0732   ALBUMIN 3.6 08/28/2006 0732   AST 19 08/28/2006 0732   ALT 16 08/28/2006 0732   ALKPHOS 61 08/28/2006 0732   BILITOT 0.3 08/28/2006 0732   GFRNONAA >90 09/27/2013 1257   GFRAA >90 09/27/2013 1257       Component Value Date/Time   WBC 5.4 09/27/2013 1257   WBC 5.3 12/04/2009 1156   WBC 8.1 08/28/2006 0732   HGB 10.6* 09/27/2013 1257   HGB 11.0* 12/04/2009 1156   HGB 11.6* 08/28/2006 0732   HCT 33.0* 09/27/2013 1257   HCT 33.6* 12/04/2009 1156   HCT 35.1* 08/28/2006 0732   MCV 76.0*  09/27/2013 1257   MCV 81.2 12/04/2009 1156   MCV 81.9 08/28/2006 0732    Lipid Panel  No results found for: CHOL, TRIG, HDL, CHOLHDL, VLDL, LDLCALC, LDLDIRECT  ABG No results found for: PHART, PCO2ART, PO2ART, HCO3, TCO2, ACIDBASEDEF, O2SAT   No results found for: TSH BNP (last 3 results) No results for input(s): BNP in the last 8760 hours.  ProBNP (last 3 results) No results for input(s): PROBNP in the last 8760 hours.  Cardiac Panel (last 3 results) No results for input(s): CKTOTAL, CKMB, TROPONINI, RELINDX in the last 72 hours.  Iron/TIBC/Ferritin/ %Sat No results found for: IRON, TIBC, FERRITIN, IRONPCTSAT   EKG Orders placed or performed in visit on 04/30/14  . Cardiac event monitor     Prior Assessment and Plan Problem List as of 05/01/2014      Cardiovascular and Mediastinum   PVC's (premature ventricular contractions)   Last Assessment & Plan 11/16/2010 Office Visit Written 11/16/2010 11:44 AM by Satira Sark, MD    Reportedly noted during recent bicycle test in Dr. Cathey Endow office. Patient denies any dizziness or syncope, however has had a sense of palpitations in the last month. LVEF is not known.      Essential hypertension, benign   Last Assessment & Plan 04/10/2013 Office Visit Edited 04/10/2013  4:36 PM by Lendon Colonel, NP    He is requesting more samples for antihypertensive medications. We do not have any available. She is concerned about the cost of her current medication. I have advised her that we can give her a prescription for Benicar 40 mg/HCTZ 12.5 mg. She has [employer] and [employer] and this should help to cover the cost. It is not on [employer] $4 plan. Would prefer that she be on an ARB.   Addendum:  The patient is upset at the cost of the medication prescribed, stating now that a pre-authorization should be completed and it is too expensive for her. She now be placed on lisinopril 10/12.5 mg daily.We will see her in a year and have  her see PCP on follow-up.          Endocrine   Type II or unspecified type diabetes mellitus without mention of complication, uncontrolled   Last Assessment & Plan 11/16/2010 Office Visit Written 11/16/2010 11:46 AM by Satira Sark, MD    Recently diagnosed, followed by Dr. Berdine Addison.  Other   Chest pain   Last Assessment & Plan 04/15/2012 Office Visit Written 04/15/2012  2:18 PM by Kathryn M Lawrence, NP    Stress echocardiogram completed revealed no echocardiographic evidence for stress-induced ischemia, EKG showed no diagnostic ST changes or inducible arrhythmias. The stress ECG was negative for ischemia. I explained this to her and she verbalizes understanding. She continues to be concerned about heartburn type symptoms along with feelings of food being stuck in her esophagus when she lies down. I started her on a low-dose H2 blocker Pepcid 20 mg daily. She is to followup with her primary care physician for ongoing assessment and need to refer to GI at his discretion.      Shortness of breath   Last Assessment & Plan 04/10/2013 Office Visit Written 04/10/2013  4:29 PM by Kathryn M Lawrence, NP    I advised her to followup with Dr. Hill, and ask about sleep apnea, and possible sleep study due to body habitus and ongoing hypertension.          Imaging: No results found.        

## 2014-05-01 NOTE — Progress Notes (Deleted)
Cardiology Office Note   Date:  05/01/2014   ID:  Melissa LanaCassandra N Darrough, DOB 06/03/1965, MRN [id number]  PCP:  Evlyn CourierHILL,GERALD K, MD  Cardiologist: Arlington CalixBranch/ Marquelle Musgrave, NP   Chief Complaint  Patient presents with  . Palpitations  . Hypertension  . Chest Pain    Atypical      History of Present Illness: Melissa Casey is a 49 y.o. female who presents for ongoing assessment and management of palpitations, hypertension, atypical chest pain.  She was last seen by Dr. Wyline MoodBranch on 04/10/2014.  P. 70 monitor was placed to evaluate for frequency and quality of palpitations.  She was continued on current medication regimen.  This recent stress test was completed in 10 2000, which was a stress echo negative for ischemia with no echocardiographic evidence of stress-induced ischemia.  Unfortunately, the patient removed the 10 monitor because it kept beeping.  She did not return to the office to have a replaced.  Also, a note from Dr. Wyline MoodBranch requested that she stop tribenzor and begin lisinopril 20 mg daily and HCTZ 25 mg daily.  She was to take her blood pressure at home and reported on followup visit    Past Medical History  Diagnosis Date  . Essential hypertension, benign   . IBS (irritable bowel syndrome)   . Premature ventricular contractions (PVCs) (VPCs)     Per Dr. Loleta ChanceHill  . Type 2 diabetes mellitus   . DVT (deep venous thrombosis)     History reviewed. No pertinent past surgical history.   Current Outpatient Prescriptions  Medication Sig Dispense Refill  . aspirin 81 MG tablet Take 81 mg by mouth daily.      . hydrochlorothiazide (HYDRODIURIL) 25 MG tablet Take 1 tablet (25 mg total) by mouth daily. 90 tablet 3  . ibuprofen (ADVIL,MOTRIN) 800 MG tablet Take 800 mg by mouth 2 (two) times daily.      . Olmesartan-Amlodipine-HCTZ (TRIBENZOR) 40-10-25 MG TABS Take 1 tablet by mouth daily. 90 tablet 3  . lisinopril (PRINIVIL,ZESTRIL) 20 MG tablet Take 1 tablet (20 mg total) by mouth  daily. 90 tablet 3   No current facility-administered medications for this visit.    Allergies:   Review of patient's allergies indicates no known allergies.    Social History:  The patient  reports that she has never smoked. She has never used smokeless tobacco. She reports that she does not drink alcohol or use illicit drugs.   Family History:  The patient's family history includes Arrhythmia in her sister; Coronary artery disease in her father and mother; Hypertension in her brother.    ROS: .   All other systems are reviewed and negative.Unless otherwise mentioned in  H&P above.   PHYSICAL EXAM: VS:  BP 160/96 mmHg  Pulse 84  Ht 5\' 9"  (1.753 m)  Wt 276 lb (125.193 kg)  BMI 40.74 kg/m2  SpO2 99% , BMI Body mass index is 40.74 kg/(m^2). GEN: Well nourished, well developed, in no acute distress HEENT: normal Neck: no JVD, carotid bruits, or masses Cardiac: ***RRR; no murmurs, rubs, or gallops,no edema  Respiratory:  clear to auscultation bilaterally, normal work of breathing GI: soft, nontender, nondistended, + BS MS: no deformity or atrophy Skin: warm and dry, no rash Neuro:  Strength and sensation are intact Psych: euthymic mood, full affect   EKG:  EKG {ACTION; IS/IS [id number]} ordered today. The ekg ordered today demonstrates ***   Recent Labs: 09/27/2013: BUN 12; Creatinine 0.63; Hemoglobin 10.6*; Platelets 248; Potassium  3.4*; Sodium 140    Lipid Panel No results found for: CHOL, TRIG, HDL, CHOLHDL, VLDL, LDLCALC, LDLDIRECT    Wt Readings from Last 3 Encounters:  05/01/14 276 lb (125.193 kg)  04/10/14 273 lb 6.4 oz (124.013 kg)  09/27/13 270 lb (122.471 kg)      Other studies Reviewed: Additional studies/ records that were reviewed today include: ***. Review of the above records demonstrates: ***   ASSESSMENT AND PLAN:  1.  ***   Current medicines are reviewed at length with the patient today.    Labs/ tests ordered today include: *** No  orders of the defined types were placed in this encounter.     Disposition:   FU with *** in {gen number [id number]} {TIME; UNITS DAY/WEEK/MONTH:19136}   Signed, Joni Reining, NP  05/01/2014 1:31 PM    [facility] [address], [place], [place] [address] Phone: [phone]; Fax: [phone]

## 2014-06-08 ENCOUNTER — Encounter: Payer: Self-pay | Admitting: Adult Health

## 2014-06-08 ENCOUNTER — Ambulatory Visit (INDEPENDENT_AMBULATORY_CARE_PROVIDER_SITE_OTHER): Payer: BLUE CROSS/BLUE SHIELD | Admitting: Adult Health

## 2014-06-08 VITALS — BP 150/80 | HR 68 | Ht 68.0 in | Wt 276.0 lb

## 2014-06-08 DIAGNOSIS — I10 Essential (primary) hypertension: Secondary | ICD-10-CM | POA: Diagnosis not present

## 2014-06-08 NOTE — Progress Notes (Deleted)
Name: Melissa Casey    DOB: 1965-11-04  Age: 49 y.o.  MR#: [id number]       PCP:  Maggie Font, MD      Insurance: Payor: [place] / Plan: BCBS OTHER / Product Type: *No Product type* /   CC:    Chief Complaint  Patient presents with  . Hypertension  . Palpitations    VS Filed Vitals:   06/08/14 1348  BP: 150/80  Pulse: 68  Height: '5\' 8"'  (1.727 m)  Weight: 276 lb (125.193 kg)  SpO2: 98%    Weights Current Weight  06/08/14 276 lb (125.193 kg)  05/01/14 276 lb (125.193 kg)  04/10/14 273 lb 6.4 oz (124.013 kg)    Blood Pressure  BP Readings from Last 3 Encounters:  06/08/14 150/80  05/01/14 160/96  04/10/14 142/88     Admit date:  (Not on file) Last encounter with RMR:  05/01/2014   Allergy Review of patient's allergies indicates no known allergies.  Current Outpatient Prescriptions  Medication Sig Dispense Refill  . acetaminophen (TYLENOL) 500 MG tablet Take 500 mg by mouth every 6 (six) hours as needed.    Marland Kitchen aspirin 81 MG tablet Take 81 mg by mouth daily.      Marland Kitchen lisinopril-hydrochlorothiazide (PRINZIDE,ZESTORETIC) 20-25 MG per tablet Take 1 tablet by mouth daily. 90 tablet 3  . Saxagliptin-Metformin 2.05-998 MG TB24 Take by mouth at bedtime.    Marland Kitchen spironolactone (ALDACTONE) 25 MG tablet Take 0.5 tablets (12.5 mg total) by mouth daily. 45 tablet 3   No current facility-administered medications for this visit.    Discontinued Meds:    Medications Discontinued During This Encounter  Medication Reason  . Olmesartan-Amlodipine-HCTZ (TRIBENZOR) 40-10-25 MG TABS Error  . hydrochlorothiazide (HYDRODIURIL) 25 MG tablet Error  . ibuprofen (ADVIL,MOTRIN) 800 MG tablet Error    Patient Active Problem List   Diagnosis Date Noted  . Chest pain 11/16/2010  . Shortness of breath 11/16/2010  . PVC's (premature ventricular contractions) 11/16/2010  . Essential hypertension, benign 11/16/2010  . Type II or unspecified type diabetes mellitus without mention  of complication, uncontrolled 11/16/2010    LABS    Component Value Date/Time   NA 140 09/27/2013 1257   NA 138 12/04/2009 1156   NA 136 08/28/2006 0732   K 3.4* 09/27/2013 1257   K 3.3* 12/04/2009 1156   K 3.6 08/28/2006 0732   CL 102 09/27/2013 1257   CL 103 12/04/2009 1156   CL 108 08/28/2006 0732   CO2 25 09/27/2013 1257   CO2 27 12/04/2009 1156   CO2 22 08/28/2006 0732   GLUCOSE 97 09/27/2013 1257   GLUCOSE 99 12/04/2009 1156   GLUCOSE 136* 08/28/2006 0732   BUN 12 09/27/2013 1257   BUN 9 12/04/2009 1156   BUN 15 08/28/2006 0732   CREATININE 0.63 09/27/2013 1257   CREATININE 0.68 12/04/2009 1156   CREATININE 1.05 08/28/2006 0732   CALCIUM 8.9 09/27/2013 1257   CALCIUM 8.9 12/04/2009 1156   CALCIUM 8.9 08/28/2006 0732   GFRNONAA >90 09/27/2013 1257   GFRNONAA >60 12/04/2009 1156   GFRNONAA 58* 08/28/2006 0732   GFRAA >90 09/27/2013 1257   GFRAA  12/04/2009 1156    >60        The eGFR has been calculated using the MDRD equation. This calculation has not been validated in all clinical situations. eGFR's persistently <60 mL/min signify possible Chronic Kidney Disease.   GFRAA  08/28/2006 0732    >60  The eGFR has been calculated using the MDRD equation. This calculation has not been validated in all clinical   CMP     Component Value Date/Time   NA 140 09/27/2013 1257   K 3.4* 09/27/2013 1257   CL 102 09/27/2013 1257   CO2 25 09/27/2013 1257   GLUCOSE 97 09/27/2013 1257   BUN 12 09/27/2013 1257   CREATININE 0.63 09/27/2013 1257   CALCIUM 8.9 09/27/2013 1257   PROT 7.0 08/28/2006 0732   ALBUMIN 3.6 08/28/2006 0732   AST 19 08/28/2006 0732   ALT 16 08/28/2006 0732   ALKPHOS 61 08/28/2006 0732   BILITOT 0.3 08/28/2006 0732   GFRNONAA >90 09/27/2013 1257   GFRAA >90 09/27/2013 1257       Component Value Date/Time   WBC 5.4 09/27/2013 1257   WBC 5.3 12/04/2009 1156   WBC 8.1 08/28/2006 0732   HGB 10.6* 09/27/2013 1257   HGB 11.0*  12/04/2009 1156   HGB 11.6* 08/28/2006 0732   HCT 33.0* 09/27/2013 1257   HCT 33.6* 12/04/2009 1156   HCT 35.1* 08/28/2006 0732   MCV 76.0* 09/27/2013 1257   MCV 81.2 12/04/2009 1156   MCV 81.9 08/28/2006 0732    Lipid Panel  No results found for: CHOL, TRIG, HDL, CHOLHDL, VLDL, LDLCALC, LDLDIRECT  ABG No results found for: PHART, PCO2ART, PO2ART, HCO3, TCO2, ACIDBASEDEF, O2SAT   No results found for: TSH BNP (last 3 results) No results for input(s): BNP in the last 8760 hours.  ProBNP (last 3 results) No results for input(s): PROBNP in the last 8760 hours.  Cardiac Panel (last 3 results) No results for input(s): CKTOTAL, CKMB, TROPONINI, RELINDX in the last 72 hours.  Iron/TIBC/Ferritin/ %Sat No results found for: IRON, TIBC, FERRITIN, IRONPCTSAT   EKG Orders placed or performed in visit on 04/30/14  . Cardiac event monitor     Prior Assessment and Plan Problem List as of 06/08/2014      Cardiovascular and Mediastinum   PVC's (premature ventricular contractions)   Last Assessment & Plan 11/16/2010 Office Visit Written 11/16/2010 11:44 AM by Satira Sark, MD    Reportedly noted during recent bicycle test in Dr. Cathey Endow office. Patient denies any dizziness or syncope, however has had a sense of palpitations in the last month. LVEF is not known.      Essential hypertension, benign   Last Assessment & Plan 04/10/2013 Office Visit Edited 04/10/2013  4:36 PM by Lendon Colonel, NP    He is requesting more samples for antihypertensive medications. We do not have any available. She is concerned about the cost of her current medication. I have advised her that we can give her a prescription for Benicar 40 mg/HCTZ 12.5 mg. She has [employer] and this should help to cover the cost. It is not on [employer] $4 plan. Would prefer that she be on an ARB.   Addendum:  The patient is upset at the cost of the medication prescribed, stating now that a  pre-authorization should be completed and it is too expensive for her. She now be placed on lisinopril 10/12.5 mg daily.We will see her in a year and have her see PCP on follow-up.          Endocrine   Type II or unspecified type diabetes mellitus without mention of complication, uncontrolled   Last Assessment & Plan 11/16/2010 Office Visit Written 11/16/2010 11:46 AM by Satira Sark, MD    Recently diagnosed, followed  by Dr. Berdine Addison.        Other   Chest pain   Last Assessment & Plan 04/15/2012 Office Visit Written 04/15/2012  2:18 PM by Lendon Colonel, NP    Stress echocardiogram completed revealed no echocardiographic evidence for stress-induced ischemia, EKG showed no diagnostic ST changes or inducible arrhythmias. The stress ECG was negative for ischemia. I explained this to her and she verbalizes understanding. She continues to be concerned about heartburn type symptoms along with feelings of food being stuck in her esophagus when she lies down. I started her on a low-dose H2 blocker Pepcid 20 mg daily. She is to followup with her primary care physician for ongoing assessment and need to refer to GI at his discretion.      Shortness of breath   Last Assessment & Plan 04/10/2013 Office Visit Written 04/10/2013  4:29 PM by Lendon Colonel, NP    I advised her to followup with Dr. Berdine Addison, and ask about sleep apnea, and possible sleep study due to body habitus and ongoing hypertension.          Imaging: No results found.

## 2014-06-08 NOTE — Progress Notes (Signed)
Cardiology Office Note   Date:  06/08/2014   ID:  Melissa LanaCassandra N Diebel, DOB [birth year], MRN [id number]  PCP:  Evlyn CourierHILL,GERALD K, MD  Cardiologist:  [place] CalixBranch/ Bayler Gehrig, NP   Chief Complaint  Patient presents with  . Hypertension  . Palpitations      History of Present Illness: Melissa Casey is a 49 y.o. female who presents for ongoing assessment and management of hypertension, with history of palpitations and atypical chest pain.  Other history includes diabetes mellitus, and IBS.  She was last seen in the office in March of 2016 by Dr. Wyline MoodBranch and I saw her on followup in April of 2016.  At that time.  She remained hypertensive.  I doubt that a low-sodium diet.  She was placed on lisinopril 20/HCTZ 25 mg.  She is also started on spironolactone 12.5 mg daily with a followup BMET to be drawn.   As a feeling better.  She is medically compliant.  Did not always compliant with low sodium.  Her blood pressure is better than it was on last office visit.  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  The patient has a son, who recently got into college to play football, and plans on walking, with him as he prepares to be ready to joint practice.  She is also working double shifts now testicular on vacation and she feels, that she has been eating poorly as a result.she is able to afford her medications [facility]     Past Medical History  Diagnosis Date  . Essential hypertension, benign   . IBS (irritable bowel syndrome)   . Premature ventricular contractions (PVCs) (VPCs)     Per Dr. Loleta ChanceHill  . Type 2 diabetes mellitus   . DVT (deep venous thrombosis)     No past surgical history on file.   Current Outpatient Prescriptions  Medication Sig Dispense Refill  . acetaminophen (TYLENOL) 500 MG tablet Take 500 mg by mouth every 6 (six) hours as needed.    Marland Kitchen. aspirin 81 MG tablet Take 81 mg by mouth  daily.      Marland Kitchen. lisinopril-hydrochlorothiazide (PRINZIDE,ZESTORETIC) 20-25 MG per tablet Take 1 tablet by mouth daily. 90 tablet 3  . Saxagliptin-Metformin 2.05-998 MG TB24 Take by mouth at bedtime.    Marland Kitchen. spironolactone (ALDACTONE) 25 MG tablet Take 0.5 tablets (12.5 mg total) by mouth daily. 45 tablet 3   No current facility-administered medications for this visit.    Allergies:   Review of patient's allergies indicates no known allergies.    Social History:  The patient  reports that she has never smoked. She has never used smokeless tobacco. She reports that she does not drink alcohol or use illicit drugs.   Family History:  The patient's family history includes Arrhythmia in her sister; Coronary artery disease in her father and mother; Hypertension in her brother.    ROS: .   All other systems are reviewed and negative.Unless otherwise mentioned in he and H&P above.   PHYSICAL EXAM: VS:  BP 150/80 mmHg  Pulse 68  Ht 5\' 8"  (1.727 m)  Wt 276 lb (125.193 kg)  BMI 41.98 kg/m2  SpO2 98% , BMI Body mass index is 41.98 kg/(m^2). GEN: Well nourished, well developed, in no acute distress HEENT: normal Neck: no JVD, carotid bruits, or masses Cardiac: RRR; no murmurs, rubs, or gallops,no edema  Respiratory:  clear to auscultation bilaterally, normal work of breathing GI: soft, nontender, nondistended, + BS MS: no deformity or atrophy Skin: warm and dry, no rash Neuro:  Strength and sensation are intact Psych: euthymic mood, full affect   Recent Labs: 09/27/2013: BUN 12; Creatinine 0.63; Hemoglobin 10.6*; Platelets 248; Potassium 3.4*; Sodium 140    Lipid Panel No results found for: CHOL, TRIG, HDL, CHOLHDL, VLDL, LDLCALC, LDLDIRECT    Wt Readings from Last 3 Encounters:  06/08/14 276 lb (125.193 kg)  05/01/14 276 lb (125.193 kg)  04/10/14 273 lb 6.4 oz (124.013 kg)      ASSESSMENT AND PLAN:  1. Hypertension: patient is compliant with medications, and blood pressure is  better controlled, but not optimal.  She is counseled on a low sodium diet, she is to have a BMET drawn today.  She will increase her exercise.  I am asking her to lose about 15 pounds, which will help to decrease her blood pressure.  I do not want to add any new medications her regimen at this time.  If she continues to be moderately hypertensive, I will add amlodipine to her medication regimen.  It is my hope that she will be better compliant with sodium and lose weight by the next visit in 3 months.  Current medicines are reviewed at length with the patient today.    Labs/ tests ordered today include: BMET No orders of the defined types were placed in this encounter.     Disposition:   FU with 3 months  Signed, Joni ReiningKathryn Lauree Yurick, NP  06/08/2014 1:57 PM    [facility] [address], [place], [place] [address] Phone: [phone]; Fax: [phone]

## 2014-06-08 NOTE — Patient Instructions (Addendum)
Your physician recommends that you schedule a follow-up appointment in: 3 months with Joni ReiningKathryn Lawrence, NP   Your physician recommends that you return for lab work in: Today BMET  Increase your Exercise   Low-Sodium Eating Plan Sodium raises blood pressure and causes water to be held in the body. Getting less sodium from food will help lower your blood pressure, reduce any swelling, and protect your heart, liver, and kidneys. We get sodium by adding salt (sodium chloride) to food. Most of our sodium comes from canned, boxed, and frozen foods. Restaurant foods, fast foods, and pizza are also very high in sodium. Even if you take medicine to lower your blood pressure or to reduce fluid in your body, getting less sodium from your food is important. WHAT IS MY PLAN? Most people should limit their sodium intake to 2,300 mg a day. Your health care provider recommends that you limit your sodium intake to __________ a day.  WHAT DO I NEED TO KNOW ABOUT THIS EATING PLAN? For the low-sodium eating plan, you will follow these general guidelines:  Choose foods with a % Daily Value for sodium of less than 5% (as listed on the food label).   Use salt-free seasonings or herbs instead of table salt or sea salt.   Check with your health care provider or pharmacist before using salt substitutes.   Eat fresh foods.  Eat more vegetables and fruits.  Limit canned vegetables. If you do use them, rinse them well to decrease the sodium.   Limit cheese to 1 oz (28 g) per day.   Eat lower-sodium products, often labeled as "lower sodium" or "no salt added."  Avoid foods that contain monosodium glutamate (MSG). MSG is sometimes added to [place] food and some canned foods.  Check food labels (Nutrition Facts labels) on foods to learn how much sodium is in one serving.  Eat more home-cooked food and less restaurant, buffet, and fast food.  When eating at a restaurant, ask that your food be prepared  with less salt or none, if possible.  HOW DO I READ FOOD LABELS FOR SODIUM INFORMATION? The Nutrition Facts label lists the amount of sodium in one serving of the food. If you eat more than one serving, you must multiply the listed amount of sodium by the number of servings. Food labels may also identify foods as:  Sodium free--Less than 5 mg in a serving.  Very low sodium--35 mg or less in a serving.  Low sodium--140 mg or less in a serving.  Light in sodium--50% less sodium in a serving. For example, if a food that usually has 300 mg of sodium is changed to become light in sodium, it will have 150 mg of sodium.  Reduced sodium--25% less sodium in a serving. For example, if a food that usually has 400 mg of sodium is changed to reduced sodium, it will have 300 mg of sodium. WHAT FOODS CAN I EAT? Grains Low-sodium cereals, including oats, puffed wheat and rice, and shredded wheat cereals. Low-sodium crackers. Unsalted rice and pasta. Lower-sodium bread.  Vegetables Frozen or fresh vegetables. Low-sodium or reduced-sodium canned vegetables. Low-sodium or reduced-sodium tomato sauce and paste. Low-sodium or reduced-sodium tomato and vegetable juices.  Fruits Fresh, frozen, and canned fruit. Fruit juice.  Meat and Other Protein Products Low-sodium canned tuna and salmon. Fresh or frozen meat, poultry, seafood, and fish. Lamb. Unsalted nuts. Dried beans, peas, and lentils without added salt. Unsalted canned beans. Homemade soups without salt. Eggs.  Dairy  Milk. Soy milk. Ricotta cheese. Low-sodium or reduced-sodium cheeses. Yogurt.  Condiments Fresh and dried herbs and spices. Salt-free seasonings. Onion and garlic powders. Low-sodium varieties of mustard and ketchup. Lemon juice.  Fats and Oils Reduced-sodium salad dressings. Unsalted butter.  Other Unsalted popcorn and pretzels.  The items listed above may not be a complete list of recommended foods or beverages. Contact your  dietitian for more options. WHAT FOODS ARE NOT RECOMMENDED? Grains Instant hot cereals. Bread stuffing, pancake, and biscuit mixes. Croutons. Seasoned rice or pasta mixes. Noodle soup cups. Boxed or frozen macaroni and cheese. Self-rising flour. Regular salted crackers. Vegetables Regular canned vegetables. Regular canned tomato sauce and paste. Regular tomato and vegetable juices. Frozen vegetables in sauces. Salted french fries. Olives. Rosita FirePickles. Relishes. Sauerkraut. Salsa. Meat and Other Protein Products Salted, canned, smoked, spiced, or pickled meats, seafood, or fish. Bacon, ham, sausage, hot dogs, corned beef, chipped beef, and packaged luncheon meats. Salt pork. Jerky. Pickled herring. Anchovies, regular canned tuna, and sardines. Salted nuts. Dairy Processed cheese and cheese spreads. Cheese curds. Blue cheese and cottage cheese. Buttermilk.  Condiments Onion and garlic salt, seasoned salt, table salt, and sea salt. Canned and packaged gravies. Worcestershire sauce. Tartar sauce. Barbecue sauce. Teriyaki sauce. Soy sauce, including reduced sodium. Steak sauce. Fish sauce. Oyster sauce. Cocktail sauce. Horseradish. Regular ketchup and mustard. Meat flavorings and tenderizers. Bouillon cubes. Hot sauce. Tabasco sauce. Marinades. Taco seasonings. Relishes. Fats and Oils Regular salad dressings. Salted butter. Margarine. Ghee. Bacon fat.  Other Potato and tortilla chips. Corn chips and puffs. Salted popcorn and pretzels. Canned or dried soups. Pizza. Frozen entrees and pot pies.  The items listed above may not be a complete list of foods and beverages to avoid. Contact your dietitian for more information. Document Released: 07/08/2001 Document Revised: 01/21/2013 Document Reviewed: 11/20/2012 [facility] Patient Information 2015 [employer] [place]. This information is not intended to replace advice given to you by your health care provider. Make sure you discuss any questions you have with  your health care provider.  Thank you for choosing [facility]!

## 2014-06-09 LAB — BASIC METABOLIC PANEL
BUN: 14 mg/dL (ref 6–23)
CO2: 27 meq/L (ref 19–32)
CREATININE: 0.96 mg/dL (ref 0.50–1.10)
Calcium: 9.4 mg/dL (ref 8.4–10.5)
Chloride: 102 mEq/L (ref 96–112)
GLUCOSE: 92 mg/dL (ref 70–99)
POTASSIUM: 4.2 meq/L (ref 3.5–5.3)
SODIUM: 138 meq/L (ref 135–145)

## 2014-08-10 ENCOUNTER — Other Ambulatory Visit (HOSPITAL_COMMUNITY): Payer: Self-pay | Admitting: Family Medicine

## 2014-08-10 DIAGNOSIS — Z09 Encounter for follow-up examination after completed treatment for conditions other than malignant neoplasm: Secondary | ICD-10-CM

## 2014-08-18 ENCOUNTER — Ambulatory Visit (HOSPITAL_COMMUNITY)
Admission: RE | Admit: 2014-08-18 | Discharge: 2014-08-18 | Disposition: A | Discharge status: Home / Self Care | Payer: BLUE CROSS/BLUE SHIELD | Source: Ambulatory Visit | Attending: Family Medicine | Admitting: Family Medicine

## 2014-08-18 DIAGNOSIS — N63 Unspecified lump in breast: Secondary | ICD-10-CM | POA: Diagnosis not present

## 2014-08-18 DIAGNOSIS — Z09 Encounter for follow-up examination after completed treatment for conditions other than malignant neoplasm: Secondary | ICD-10-CM | POA: Insufficient documentation

## 2014-08-28 ENCOUNTER — Ambulatory Visit: Payer: BLUE CROSS/BLUE SHIELD | Admitting: Adult Health

## 2014-08-28 ENCOUNTER — Ambulatory Visit (INDEPENDENT_AMBULATORY_CARE_PROVIDER_SITE_OTHER): Payer: BLUE CROSS/BLUE SHIELD | Admitting: Adult Health

## 2014-08-28 ENCOUNTER — Encounter: Payer: Self-pay | Admitting: Adult Health

## 2014-08-28 VITALS — BP 140/80 | HR 91 | Ht 69.0 in | Wt 272.0 lb

## 2014-08-28 DIAGNOSIS — I10 Essential (primary) hypertension: Secondary | ICD-10-CM | POA: Diagnosis not present

## 2014-08-28 DIAGNOSIS — R002 Palpitations: Secondary | ICD-10-CM

## 2014-08-28 MED ORDER — LISINOPRIL-HYDROCHLOROTHIAZIDE 20-25 MG PO TABS: 1.0000 | ORAL_TABLET | Freq: Every day | ORAL | Status: DC

## 2014-08-28 MED ORDER — SPIRONOLACTONE 25 MG PO TABS: 12.5000 mg | ORAL_TABLET | Freq: Every day | ORAL | Status: DC

## 2014-08-28 NOTE — Progress Notes (Deleted)
Name: Melissa Casey    DOB: 25-Nov-1965  Age: 49 y.o.  MR#: [id number]       PCP:  Maggie Font, MD      Insurance: Payor: [place] / Plan: BCBS OTHER / Product Type: *No Product type* /   CC:    Chief Complaint  Patient presents with  . Hypertension  . Palpitations    VS Filed Vitals:   08/28/14 1315  BP: 140/80  Pulse: 91  Height: '5\' 9"'  (1.753 m)  Weight: 272 lb (123.378 kg)  SpO2: 96%    Weights Current Weight  08/28/14 272 lb (123.378 kg)  06/08/14 276 lb (125.193 kg)  05/01/14 276 lb (125.193 kg)    Blood Pressure  BP Readings from Last 3 Encounters:  08/28/14 140/80  06/08/14 150/80  05/01/14 160/96     Admit date:  (Not on file) Last encounter with RMR:  06/08/2014   Allergy Review of patient's allergies indicates no known allergies.  Current Outpatient Prescriptions  Medication Sig Dispense Refill  . acetaminophen (TYLENOL) 500 MG tablet Take 500 mg by mouth every 6 (six) hours as needed.    Marland Kitchen aspirin 81 MG tablet Take 81 mg by mouth daily.      Marland Kitchen lisinopril-hydrochlorothiazide (PRINZIDE,ZESTORETIC) 20-25 MG per tablet Take 1 tablet by mouth daily. 90 tablet 3  . Saxagliptin-Metformin 2.05-998 MG TB24 Take by mouth at bedtime.    Marland Kitchen spironolactone (ALDACTONE) 25 MG tablet Take 0.5 tablets (12.5 mg total) by mouth daily. 45 tablet 3   No current facility-administered medications for this visit.    Discontinued Meds:   There are no discontinued medications.  Patient Active Problem List   Diagnosis Date Noted  . Chest pain 11/16/2010  . Shortness of breath 11/16/2010  . PVC's (premature ventricular contractions) 11/16/2010  . Essential hypertension, benign 11/16/2010  . Type II or unspecified type diabetes mellitus without mention of complication, uncontrolled 11/16/2010    LABS    Component Value Date/Time   NA 138 06/08/2014 1438   NA 140 09/27/2013 1257   NA 138 12/04/2009 1156   K 4.2 06/08/2014 1438   K 3.4* 09/27/2013 1257    K 3.3* 12/04/2009 1156   CL 102 06/08/2014 1438   CL 102 09/27/2013 1257   CL 103 12/04/2009 1156   CO2 27 06/08/2014 1438   CO2 25 09/27/2013 1257   CO2 27 12/04/2009 1156   GLUCOSE 92 06/08/2014 1438   GLUCOSE 97 09/27/2013 1257   GLUCOSE 99 12/04/2009 1156   BUN 14 06/08/2014 1438   BUN 12 09/27/2013 1257   BUN 9 12/04/2009 1156   CREATININE 0.96 06/08/2014 1438   CREATININE 0.63 09/27/2013 1257   CREATININE 0.68 12/04/2009 1156   CREATININE 1.05 08/28/2006 0732   CALCIUM 9.4 06/08/2014 1438   CALCIUM 8.9 09/27/2013 1257   CALCIUM 8.9 12/04/2009 1156   GFRNONAA >90 09/27/2013 1257   GFRNONAA >60 12/04/2009 1156   GFRNONAA 58* 08/28/2006 0732   GFRAA >90 09/27/2013 1257   GFRAA  12/04/2009 1156    >60        The eGFR has been calculated using the MDRD equation. This calculation has not been validated in all clinical situations. eGFR's persistently <60 mL/min signify possible Chronic Kidney Disease.   GFRAA  08/28/2006 0732    >60        The eGFR has been calculated using the MDRD equation. This calculation has not been validated in all clinical  CMP     Component Value Date/Time   NA 138 06/08/2014 1438   K 4.2 06/08/2014 1438   CL 102 06/08/2014 1438   CO2 27 06/08/2014 1438   GLUCOSE 92 06/08/2014 1438   BUN 14 06/08/2014 1438   CREATININE 0.96 06/08/2014 1438   CREATININE 0.63 09/27/2013 1257   CALCIUM 9.4 06/08/2014 1438   PROT 7.0 08/28/2006 0732   ALBUMIN 3.6 08/28/2006 0732   AST 19 08/28/2006 0732   ALT 16 08/28/2006 0732   ALKPHOS 61 08/28/2006 0732   BILITOT 0.3 08/28/2006 0732   GFRNONAA >90 09/27/2013 1257   GFRAA >90 09/27/2013 1257       Component Value Date/Time   WBC 5.4 09/27/2013 1257   WBC 5.3 12/04/2009 1156   WBC 8.1 08/28/2006 0732   HGB 10.6* 09/27/2013 1257   HGB 11.0* 12/04/2009 1156   HGB 11.6* 08/28/2006 0732   HCT 33.0* 09/27/2013 1257   HCT 33.6* 12/04/2009 1156   HCT 35.1* 08/28/2006 0732   MCV 76.0*  09/27/2013 1257   MCV 81.2 12/04/2009 1156   MCV 81.9 08/28/2006 0732    Lipid Panel  No results found for: CHOL, TRIG, HDL, CHOLHDL, VLDL, LDLCALC, LDLDIRECT  ABG No results found for: PHART, PCO2ART, PO2ART, HCO3, TCO2, ACIDBASEDEF, O2SAT   No results found for: TSH BNP (last 3 results) No results for input(s): BNP in the last 8760 hours.  ProBNP (last 3 results) No results for input(s): PROBNP in the last 8760 hours.  Cardiac Panel (last 3 results) No results for input(s): CKTOTAL, CKMB, TROPONINI, RELINDX in the last 72 hours.  Iron/TIBC/Ferritin/ %Sat No results found for: IRON, TIBC, FERRITIN, IRONPCTSAT   EKG Orders placed or performed in visit on 04/30/14  . Cardiac event monitor     Prior Assessment and Plan Problem List as of 08/28/2014      Cardiovascular and Mediastinum   PVC's (premature ventricular contractions)   Last Assessment & Plan 11/16/2010 Office Visit Written 11/16/2010 11:44 AM by Satira Sark, MD    Reportedly noted during recent bicycle test in Dr. Cathey Endow office. Patient denies any dizziness or syncope, however has had a sense of palpitations in the last month. LVEF is not known.      Essential hypertension, benign   Last Assessment & Plan 04/10/2013 Office Visit Edited 04/10/2013  4:36 PM by Lendon Colonel, NP    He is requesting more samples for antihypertensive medications. We do not have any available. She is concerned about the cost of her current medication. I have advised her that we can give her a prescription for Benicar 40 mg/HCTZ 12.5 mg. She has [employer] and [employer] and this should help to cover the cost. It is not on [employer] $4 plan. Would prefer that she be on an ARB.   Addendum:  The patient is upset at the cost of the medication prescribed, stating now that a pre-authorization should be completed and it is too expensive for her. She now be placed on lisinopril 10/12.5 mg daily.We will see her in a year and have  her see PCP on follow-up.          Endocrine   Type II or unspecified type diabetes mellitus without mention of complication, uncontrolled   Last Assessment & Plan 11/16/2010 Office Visit Written 11/16/2010 11:46 AM by Satira Sark, MD    Recently diagnosed, followed by Dr. Berdine Addison.        Other   Chest  pain   Last Assessment & Plan 04/15/2012 Office Visit Written 04/15/2012  2:18 PM by Lendon Colonel, NP    Stress echocardiogram completed revealed no echocardiographic evidence for stress-induced ischemia, EKG showed no diagnostic ST changes or inducible arrhythmias. The stress ECG was negative for ischemia. I explained this to her and she verbalizes understanding. She continues to be concerned about heartburn type symptoms along with feelings of food being stuck in her esophagus when she lies down. I started her on a low-dose H2 blocker Pepcid 20 mg daily. She is to followup with her primary care physician for ongoing assessment and need to refer to GI at his discretion.      Shortness of breath   Last Assessment & Plan 04/10/2013 Office Visit Written 04/10/2013  4:29 PM by Lendon Colonel, NP    I advised her to followup with Dr. Berdine Addison, and ask about sleep apnea, and possible sleep study due to body habitus and ongoing hypertension.          Imaging: Mm Diag Breast Tomo Bilateral  08/18/2014   CLINICAL DATA:  Followup for probably benign mass in the 5 o'clock position of the left breast. Patient previously underwent an ultrasound-guided biopsy was subareolar left breast mass in the 10 o'clock position with benign pathology of fibrocystic change.  EXAM: DIGITAL DIAGNOSTIC BILATERAL MAMMOGRAM WITH 3D TOMOSYNTHESIS AND CAD  COMPARISON:  Previous exam(s).  ACR Breast Density Category b: There are scattered areas of fibroglandular density.  FINDINGS: Ribbon shaped biopsy clip is present in the superficial subareolar left breast, upper-outer quadrant. Previously described probably benign  circumscribed nodule in the 5 o'clock region of the left breast appears slightly smaller on today's mammogram. The patient has stable similar-appearing circumscribed low-density masses in the outer right breast. Findings are consistent with a benign etiology. No new or suspicious mass, distortion, or suspicious microcalcification is identified in either breast.  Mammographic images were processed with CAD.  IMPRESSION: No evidence of malignancy in either breast.  RECOMMENDATION: Screening mammogram in one year.(Code:[id number]-B-01Y)  I have discussed the findings and recommendations with the patient. Results were also provided in writing at the conclusion of the visit. If applicable, a reminder letter will be sent to the patient regarding the next appointment.  BI-RADS CATEGORY  2: Benign.   Electronically Signed   By: Curlene Dolphin M.D.   On: 08/18/2014 14:36

## 2014-08-28 NOTE — Progress Notes (Signed)
Cardiology Office Note   Date:  08/28/2014   ID:  Melissa Casey, DOB 03-11-1965, MRN [id number]  PCP:  Evlyn Courier, MD  Cardiologist:  Arlington Calix, NP   Chief Complaint  Patient presents with  . Hypertension  . Palpitations      History of Present Illness: Melissa Casey is a 49 y.o. female who presents for ongoing assessment and management of hypertension, with history of palpitations, and atypical chest pain.  Other history includes diabetes, irritable bowel syndrome.  The patient was last seen in the office in May 2016, as I office visit.  She was compliant with medications.  Blood pressure is better controlled, but not optimal.  She was counseled on a low sodium diet, advised to increase her exercise.  I asked her to try and lose approximately 15 pounds, which will help with blood pressure control.  If blood pressure is not better controlled.  Consideration for adding amlodipine on this visit.  She is doing better this visit and is medically compliant. She is working out at a fitness center with her son. She has BP checked at work by [employer] there. She is without complaints.   Past Medical History  Diagnosis Date  . Essential hypertension, benign   . IBS (irritable bowel syndrome)   . Premature ventricular contractions (PVCs) (VPCs)     Per Dr. Loleta Chance  . Type 2 diabetes mellitus   . DVT (deep venous thrombosis)     History reviewed. No pertinent past surgical history.   Current Outpatient Prescriptions  Medication Sig Dispense Refill  . acetaminophen (TYLENOL) 500 MG tablet Take 500 mg by mouth every 6 (six) hours as needed.    Marland Kitchen aspirin 81 MG tablet Take 81 mg by mouth daily.      Marland Kitchen lisinopril-hydrochlorothiazide (PRINZIDE,ZESTORETIC) 20-25 MG per tablet Take 1 tablet by mouth daily. 90 tablet 3  . Saxagliptin-Metformin 2.05-998 MG TB24 Take by mouth at bedtime.    Marland Kitchen spironolactone (ALDACTONE) 25 MG tablet Take 0.5 tablets (12.5 mg total) by mouth daily.  45 tablet 3   No current facility-administered medications for this visit.    Allergies:   Review of patient's allergies indicates no known allergies.    Social History:  The patient  reports that she has never smoked. She has never used smokeless tobacco. She reports that she does not drink alcohol or use illicit drugs.   Family History:  The patient's family history includes Arrhythmia in her sister; Coronary artery disease in her father and mother; Hypertension in her brother.    ROS: .   All other systems are reviewed and negative.Unless otherwise mentioned in H&P above.   PHYSICAL EXAM: VS:  BP 140/80 mmHg  Pulse 91  Ht 5\' 9"  (1.753 m)  Wt 272 lb (123.378 kg)  BMI 40.15 kg/m2  SpO2 96% , BMI Body mass index is 40.15 kg/(m^2). GEN: Well nourished, well developed, in no acute distress HEENT: normal Neck: no JVD, carotid bruits, or masses Cardiac: RRR; no murmurs, rubs, or gallops,no edema  Respiratory:  clear to auscultation bilaterally, normal work of breathing GI: soft, nontender, nondistended, + BS MS: no deformity or atrophy Skin: warm and dry, no rash Neuro:  Strength and sensation are intact Psych: euthymic mood, full affect   ERecent Labs: 09/27/2013: Hemoglobin 10.6*; Platelets 248 06/08/2014: BUN 14; Creat 0.96; Potassium 4.2; Sodium 138    Lipid Panel No results found for: CHOL, TRIG, HDL, CHOLHDL, VLDL, LDLCALC, LDLDIRECT  Wt Readings from Last 3 Encounters:  08/28/14 272 lb (123.378 kg)  06/08/14 276 lb (125.193 kg)  05/01/14 276 lb (125.193 kg)      Other studies Reviewed: Additional studies/ records that were reviewed today include: Labs from May 2016 Review of the above records demonstrates: Good results.    ASSESSMENT AND PLAN:  1. Hypertension: BP is better controlled she is complaint. She is exercising regularly and beginning to lose weight. She will continue on current medications. Labs were completed in May. No new labs.   2.  Palpations: No complaints.     Current medicines are reviewed at length with the patient today.    Labs/ tests ordered today include: None No orders of the defined types were placed in this encounter.     Disposition:   FU with 6 months.    Signed, Joni Reining, NP  08/28/2014 1:39 PM    [facility] [address], [place], [place] [address] Phone: [phone]; Fax: [phone]

## 2014-08-28 NOTE — Patient Instructions (Signed)
Your physician wants you to follow-up in: 6 months with Harriet Pho, NP. You will receive a reminder letter in the mail two months in advance. If you don't receive a letter, please call our office to schedule the follow-up appointment.  Your physician recommends that you continue on your current medications as directed. Please refer to the Current Medication list given to you today.  Thank you for choosing [facility]!  Keep up the good work!!

## 2014-09-07 ENCOUNTER — Ambulatory Visit: Payer: BLUE CROSS/BLUE SHIELD | Admitting: Adult Health

## 2015-01-04 ENCOUNTER — Encounter: Payer: Self-pay | Admitting: Adult Health

## 2015-01-04 ENCOUNTER — Ambulatory Visit (INDEPENDENT_AMBULATORY_CARE_PROVIDER_SITE_OTHER): Payer: BLUE CROSS/BLUE SHIELD | Admitting: Adult Health

## 2015-01-04 ENCOUNTER — Ambulatory Visit (HOSPITAL_COMMUNITY)
Admission: RE | Admit: 2015-01-04 | Discharge: 2015-01-04 | Disposition: A | Discharge status: Home / Self Care | Payer: BLUE CROSS/BLUE SHIELD | Source: Ambulatory Visit | Attending: Adult Health | Admitting: Adult Health

## 2015-01-04 VITALS — BP 135/90 | HR 75 | Ht 68.0 in | Wt 272.0 lb

## 2015-01-04 DIAGNOSIS — R002 Palpitations: Secondary | ICD-10-CM

## 2015-01-04 DIAGNOSIS — I10 Essential (primary) hypertension: Secondary | ICD-10-CM | POA: Diagnosis not present

## 2015-01-04 DIAGNOSIS — M542 Cervicalgia: Secondary | ICD-10-CM | POA: Insufficient documentation

## 2015-01-04 MED ORDER — SPIRONOLACTONE 25 MG PO TABS: 12.5000 mg | ORAL_TABLET | Freq: Every day | ORAL | Status: DC

## 2015-01-04 MED ORDER — LISINOPRIL-HYDROCHLOROTHIAZIDE 20-25 MG PO TABS: 1.0000 | ORAL_TABLET | Freq: Every day | ORAL | Status: DC

## 2015-01-04 NOTE — Progress Notes (Deleted)
Name: Melissa Casey    DOB: 13-Aug-1965  Age: 49 y.o.  MR#: [id number]       PCP:  Maggie Font, MD      Insurance: Payor: [place] / Plan: BCBS OTHER / Product Type: *No Product type* /   CC:   No chief complaint on file.   VS Filed Vitals:   01/04/15 1539  BP: 135/90  Pulse: 75  Height: '5\' 8"'  (1.727 m)  Weight: 272 lb (123.378 kg)  SpO2: 98%    Weights Current Weight  01/04/15 272 lb (123.378 kg)  08/28/14 272 lb (123.378 kg)  06/08/14 276 lb (125.193 kg)    Blood Pressure  BP Readings from Last 3 Encounters:  01/04/15 135/90  08/28/14 140/80  06/08/14 150/80     Admit date:  (Not on file) Last encounter with RMR:  08/28/2014   Allergy Review of patient's allergies indicates no known allergies.  Current Outpatient Prescriptions  Medication Sig Dispense Refill  . aspirin 81 MG tablet Take 81 mg by mouth daily.      Marland Kitchen lisinopril-hydrochlorothiazide (PRINZIDE,ZESTORETIC) 20-25 MG per tablet Take 1 tablet by mouth daily. 90 tablet 3  . spironolactone (ALDACTONE) 25 MG tablet Take 0.5 tablets (12.5 mg total) by mouth daily. 45 tablet 3   No current facility-administered medications for this visit.    Discontinued Meds:    Medications Discontinued During This Encounter  Medication Reason  . acetaminophen (TYLENOL) 500 MG tablet Error  . Saxagliptin-Metformin 2.05-998 MG TB24 Error    Patient Active Problem List   Diagnosis Date Noted  . Chest pain 11/16/2010  . Shortness of breath 11/16/2010  . PVC's (premature ventricular contractions) 11/16/2010  . Essential hypertension, benign 11/16/2010  . Type II or unspecified type diabetes mellitus without mention of complication, uncontrolled 11/16/2010    LABS    Component Value Date/Time   NA 138 06/08/2014 1438   NA 140 09/27/2013 1257   NA 138 12/04/2009 1156   K 4.2 06/08/2014 1438   K 3.4* 09/27/2013 1257   K 3.3* 12/04/2009 1156   CL 102 06/08/2014 1438   CL 102 09/27/2013 1257   CL  103 12/04/2009 1156   CO2 27 06/08/2014 1438   CO2 25 09/27/2013 1257   CO2 27 12/04/2009 1156   GLUCOSE 92 06/08/2014 1438   GLUCOSE 97 09/27/2013 1257   GLUCOSE 99 12/04/2009 1156   BUN 14 06/08/2014 1438   BUN 12 09/27/2013 1257   BUN 9 12/04/2009 1156   CREATININE 0.96 06/08/2014 1438   CREATININE 0.63 09/27/2013 1257   CREATININE 0.68 12/04/2009 1156   CREATININE 1.05 08/28/2006 0732   CALCIUM 9.4 06/08/2014 1438   CALCIUM 8.9 09/27/2013 1257   CALCIUM 8.9 12/04/2009 1156   GFRNONAA >90 09/27/2013 1257   GFRNONAA >60 12/04/2009 1156   GFRNONAA 58* 08/28/2006 0732   GFRAA >90 09/27/2013 1257   GFRAA  12/04/2009 1156    >60        The eGFR has been calculated using the MDRD equation. This calculation has not been validated in all clinical situations. eGFR's persistently <60 mL/min signify possible Chronic Kidney Disease.   GFRAA  08/28/2006 0732    >60        The eGFR has been calculated using the MDRD equation. This calculation has not been validated in all clinical   CMP     Component Value Date/Time   NA 138 06/08/2014 1438   K 4.2 06/08/2014 1438  CL 102 06/08/2014 1438   CO2 27 06/08/2014 1438   GLUCOSE 92 06/08/2014 1438   BUN 14 06/08/2014 1438   CREATININE 0.96 06/08/2014 1438   CREATININE 0.63 09/27/2013 1257   CALCIUM 9.4 06/08/2014 1438   PROT 7.0 08/28/2006 0732   ALBUMIN 3.6 08/28/2006 0732   AST 19 08/28/2006 0732   ALT 16 08/28/2006 0732   ALKPHOS 61 08/28/2006 0732   BILITOT 0.3 08/28/2006 0732   GFRNONAA >90 09/27/2013 1257   GFRAA >90 09/27/2013 1257       Component Value Date/Time   WBC 5.4 09/27/2013 1257   WBC 5.3 12/04/2009 1156   WBC 8.1 08/28/2006 0732   HGB 10.6* 09/27/2013 1257   HGB 11.0* 12/04/2009 1156   HGB 11.6* 08/28/2006 0732   HCT 33.0* 09/27/2013 1257   HCT 33.6* 12/04/2009 1156   HCT 35.1* 08/28/2006 0732   MCV 76.0* 09/27/2013 1257   MCV 81.2 12/04/2009 1156   MCV 81.9 08/28/2006 0732    Lipid Panel   No results found for: CHOL, TRIG, HDL, CHOLHDL, VLDL, LDLCALC, LDLDIRECT  ABG No results found for: PHART, PCO2ART, PO2ART, HCO3, TCO2, ACIDBASEDEF, O2SAT   No results found for: TSH BNP (last 3 results) No results for input(s): BNP in the last 8760 hours.  ProBNP (last 3 results) No results for input(s): PROBNP in the last 8760 hours.  Cardiac Panel (last 3 results) No results for input(s): CKTOTAL, CKMB, TROPONINI, RELINDX in the last 72 hours.  Iron/TIBC/Ferritin/ %Sat No results found for: IRON, TIBC, FERRITIN, IRONPCTSAT   EKG Orders placed or performed in visit on 04/30/14  . Cardiac event monitor     Prior Assessment and Plan Problem List as of 01/04/2015      Cardiovascular and Mediastinum   PVC's (premature ventricular contractions)   Last Assessment & Plan 11/16/2010 Office Visit Written 11/16/2010 11:44 AM by Satira Sark, MD    Reportedly noted during recent bicycle test in Dr. Cathey Endow office. Patient denies any dizziness or syncope, however has had a sense of palpitations in the last month. LVEF is not known.      Essential hypertension, benign   Last Assessment & Plan 04/10/2013 Office Visit Edited 04/10/2013  4:36 PM by Lendon Colonel, NP    He is requesting more samples for antihypertensive medications. We do not have any available. She is concerned about the cost of her current medication. I have advised her that we can give her a prescription for Benicar 40 mg/HCTZ 12.5 mg. She has [employer] and [employer] and this should help to cover the cost. It is not on [employer] $4 plan. Would prefer that she be on an ARB.   Addendum:  The patient is upset at the cost of the medication prescribed, stating now that a pre-authorization should be completed and it is too expensive for her. She now be placed on lisinopril 10/12.5 mg daily.We will see her in a year and have her see PCP on follow-up.          Endocrine   Type II or unspecified type diabetes  mellitus without mention of complication, uncontrolled   Last Assessment & Plan 11/16/2010 Office Visit Written 11/16/2010 11:46 AM by Satira Sark, MD    Recently diagnosed, followed by Dr. Berdine Addison.        Other   Chest pain   Last Assessment & Plan 04/15/2012 Office Visit Written 04/15/2012  2:18 PM by Lendon Colonel, NP  Stress echocardiogram completed revealed no echocardiographic evidence for stress-induced ischemia, EKG showed no diagnostic ST changes or inducible arrhythmias. The stress ECG was negative for ischemia. I explained this to her and she verbalizes understanding. She continues to be concerned about heartburn type symptoms along with feelings of food being stuck in her esophagus when she lies down. I started her on a low-dose H2 blocker Pepcid 20 mg daily. She is to followup with her primary care physician for ongoing assessment and need to refer to GI at his discretion.      Shortness of breath   Last Assessment & Plan 04/10/2013 Office Visit Written 04/10/2013  4:29 PM by Lendon Colonel, NP    I advised her to followup with Dr. Berdine Addison, and ask about sleep apnea, and possible sleep study due to body habitus and ongoing hypertension.          Imaging: No results found.

## 2015-01-04 NOTE — Progress Notes (Signed)
Cardiology Office Note   Date:  01/04/2015   ID:  Melissa LanaCassandra N Impson, DOB 02-19-1965, MRN [id number]  PCP:  Evlyn CourierHILL,GERALD K, MD  Cardiologist: Arlington CalixBranch/  Kathryn Lawrence, NP   Chief Complaint  Patient presents with  . Hypertension      History of Present Illness: Melissa Casey is a 49 y.o. female who presents for ongoing assessment and management of hypertension, with history of palpitations, and atypical chest pain. Other history includes diabetes, irritable bowel syndrome.    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 BP has been a little elevated. She has been having a lot of neck and shoulder pain on the left with some numbness in her hands which she thinks may be neuralgia. She is to see her PCP in January but cannot get in sooner. No chest pain or palpitations.   Past Medical History  Diagnosis Date  . Essential hypertension, benign   . IBS (irritable bowel syndrome)   . Premature ventricular contractions (PVCs) (VPCs)     Per Dr. Loleta ChanceHill  . Type 2 diabetes mellitus (HCC)   . DVT (deep venous thrombosis) (HCC)     No past surgical history on file.   Current Outpatient Prescriptions  Medication Sig Dispense Refill  . aspirin 81 MG tablet Take 81 mg by mouth daily.      Marland Kitchen. lisinopril-hydrochlorothiazide (PRINZIDE,ZESTORETIC) 20-25 MG tablet Take 1 tablet by mouth daily. 90 tablet 3  . spironolactone (ALDACTONE) 25 MG tablet Take 0.5 tablets (12.5 mg total) by mouth daily. 45 tablet 3   No current facility-administered medications for this visit.    Allergies:   Review of patient's allergies indicates no known allergies.    Social History:  The patient  reports that she has never smoked. She has never used smokeless tobacco. She reports that she does not drink alcohol or use illicit drugs.   Family History:  The patient's family history includes Arrhythmia in her sister; Coronary artery disease in her father and  mother; Hypertension in her brother.    ROS: All other systems are reviewed and negative. Unless otherwise mentioned in H&P    PHYSICAL EXAM: VS:  BP 135/90 mmHg  Pulse 75  Ht 5\' 8"  (1.727 m)  Wt 272 lb (123.378 kg)  BMI 41.37 kg/m2  SpO2 98% , BMI Body mass index is 41.37 kg/(m^2). GEN: Well nourished, well developed, in no acute distress HEENT: normal Neck: no JVD, carotid bruits, or masses Cardiac: RRR; no murmurs, rubs, or gallops,no edema  Respiratory:  clear to auscultation bilaterally, normal work of breathing GI: soft, nontender, nondistended, + BS MS: no deformity or atrophy Skin: warm and dry, no rash Neuro:  Strength and sensation are intact. Pain with neuro checks, resistance. Marland Kitchen.  Psych: euthymic mood, full affect   EKG:  NSR with PAC. RAte of 77 bpm  Recent Labs: 06/08/2014: BUN 14; Creat 0.96; Potassium 4.2; Sodium 138    Lipid Panel No results found for: CHOL, TRIG, HDL, CHOLHDL, VLDL, LDLCALC, LDLDIRECT    Wt Readings from Last 3 Encounters:  01/04/15 272 lb (123.378 kg)  08/28/14 272 lb (123.378 kg)  06/08/14 276 lb (125.193 kg)     ASSESSMENT AND PLAN:  1. Hypertension: Appears well controlled. No changes in her regimen at this time. She may be having some elevation with neck and shoulder pain.   2.Cervical neck and left shoulder pain: I will order a cervical spine series  with copy to her PCP, Dr. Loleta Chance. If abnormal may need to be seen sooner. Would not recommend NSAIDS with hypertension. Gave her Rx for Tramadol 50 mg without refills. PCP to manage once tests are resulted. I have explained this to her and she verbalizes understanding.    Current medicines are reviewed at length with the patient today.    Labs/ tests ordered today include: Cervical spine series.   Orders Placed This Encounter  Procedures  . DG Cervical Spine Complete  . EKG 12-Lead     Disposition:   FU with 6 months  Signed, Joni Reining, NP  01/04/2015 4:45 PM    [facility] [address] [place], [place] [address] Phone: [phone]; Fax: [phone]

## 2015-01-04 NOTE — Patient Instructions (Addendum)
Your physician wants you to follow-up in: 6 months with Harriet PhoK Lawrence NP You will receive a reminder letter in the mail two months in advance. If you don't receive a letter, please call our office to schedule the follow-up appointment.    Your physician recommends that you continue on your current medications as directed. Please refer to the Current Medication list given to you today.     If you need a refill on your cardiac medications before your next appointment, please call your pharmacy.    Please go to radiology for x-rays of neck now       Thank you for choosing [facility] !

## 2015-03-03 HISTORY — PX: BREAST BIOPSY: SHX20

## 2015-03-29 ENCOUNTER — Emergency Department (HOSPITAL_COMMUNITY): Payer: 59

## 2015-03-29 ENCOUNTER — Emergency Department (HOSPITAL_COMMUNITY)
Admission: EM | Admit: 2015-03-29 | Discharge: 2015-03-29 | Disposition: A | Discharge status: Home / Self Care | Payer: 59 | Attending: Emergency Medicine | Admitting: Emergency Medicine

## 2015-03-29 ENCOUNTER — Encounter (HOSPITAL_COMMUNITY): Payer: Self-pay | Admitting: *Deleted

## 2015-03-29 DIAGNOSIS — Z86718 Personal history of other venous thrombosis and embolism: Secondary | ICD-10-CM | POA: Diagnosis not present

## 2015-03-29 DIAGNOSIS — E119 Type 2 diabetes mellitus without complications: Secondary | ICD-10-CM | POA: Insufficient documentation

## 2015-03-29 DIAGNOSIS — J209 Acute bronchitis, unspecified: Secondary | ICD-10-CM | POA: Diagnosis not present

## 2015-03-29 DIAGNOSIS — I10 Essential (primary) hypertension: Secondary | ICD-10-CM | POA: Insufficient documentation

## 2015-03-29 DIAGNOSIS — R05 Cough: Secondary | ICD-10-CM | POA: Diagnosis present

## 2015-03-29 DIAGNOSIS — Z8719 Personal history of other diseases of the digestive system: Secondary | ICD-10-CM | POA: Diagnosis not present

## 2015-03-29 DIAGNOSIS — Z7982 Long term (current) use of aspirin: Secondary | ICD-10-CM | POA: Insufficient documentation

## 2015-03-29 DIAGNOSIS — Z79899 Other long term (current) drug therapy: Secondary | ICD-10-CM | POA: Insufficient documentation

## 2015-03-29 DIAGNOSIS — J40 Bronchitis, not specified as acute or chronic: Secondary | ICD-10-CM

## 2015-03-29 LAB — CBC WITH DIFFERENTIAL/PLATELET
BASOS ABS: 0 10*3/uL (ref 0.0–0.1)
Basophils Relative: 1 %
EOS PCT: 2 %
Eosinophils Absolute: 0.1 10*3/uL (ref 0.0–0.7)
HCT: 37.3 % (ref 36.0–46.0)
Hemoglobin: 11.5 g/dL — ABNORMAL LOW (ref 12.0–15.0)
LYMPHS PCT: 47 %
Lymphs Abs: 1.6 10*3/uL (ref 0.7–4.0)
MCH: 24.2 pg — ABNORMAL LOW (ref 26.0–34.0)
MCHC: 30.8 g/dL (ref 30.0–36.0)
MCV: 78.4 fL (ref 78.0–100.0)
Monocytes Absolute: 0.4 10*3/uL (ref 0.1–1.0)
Monocytes Relative: 11 %
NEUTROS PCT: 39 %
Neutro Abs: 1.3 10*3/uL — ABNORMAL LOW (ref 1.7–7.7)
Platelets: 329 10*3/uL (ref 150–400)
RBC: 4.76 MIL/uL (ref 3.87–5.11)
RDW: 16.9 % — AB (ref 11.5–15.5)
WBC: 3.3 10*3/uL — AB (ref 4.0–10.5)

## 2015-03-29 LAB — BASIC METABOLIC PANEL
ANION GAP: 12 (ref 5–15)
BUN: 12 mg/dL (ref 6–20)
CO2: 28 mmol/L (ref 22–32)
Calcium: 9.1 mg/dL (ref 8.9–10.3)
Chloride: 100 mmol/L — ABNORMAL LOW (ref 101–111)
Creatinine, Ser: 0.62 mg/dL (ref 0.44–1.00)
Glucose, Bld: 137 mg/dL — ABNORMAL HIGH (ref 65–99)
POTASSIUM: 3.5 mmol/L (ref 3.5–5.1)
SODIUM: 140 mmol/L (ref 135–145)

## 2015-03-29 LAB — TROPONIN I

## 2015-03-29 MED ORDER — CYCLOBENZAPRINE HCL 10 MG PO TABS
10.0000 mg | ORAL_TABLET | Freq: Once | ORAL | Status: AC
  Administered 2015-03-29: 10 mg via ORAL
  Filled 2015-03-29: qty 1

## 2015-03-29 MED ORDER — PREDNISONE 10 MG PO TABS
10.0000 mg | ORAL_TABLET | Freq: Every day | ORAL | Status: DC
Start: 2015-03-29 — End: 2015-06-10

## 2015-03-29 MED ORDER — ALBUTEROL SULFATE HFA 108 (90 BASE) MCG/ACT IN AERS
2.0000 | INHALATION_SPRAY | Freq: Once | RESPIRATORY_TRACT | Status: AC
  Administered 2015-03-29: 2 via RESPIRATORY_TRACT
  Filled 2015-03-29: qty 6.7

## 2015-03-29 MED ORDER — AZITHROMYCIN 250 MG PO TABS: ORAL_TABLET | ORAL | Status: DC

## 2015-03-29 NOTE — ED Provider Notes (Signed)
CSN: [id number]     Arrival date & time 03/29/15  0911 History   First MD Initiated Contact with Patient 03/29/15 [phone]     Chief Complaint  Patient presents with  . Cough     (Consider location/radiation/quality/duration/timing/severity/associated sxs/prior Treatment) HPI   Melissa Casey is a 50 y.o. female who presents to the Emergency Department complaining of productive cough for three days.  She reports coughing up dark green mucus and has developed left lateral rib pain secondary to excessive coughing .  She states the pain is worse with cough or deep breathing, improves at rest.  She also notes some nasal congestion, sore throat, body aches and malaise.  She denies shortness of breath, fever, abdominal pain, nausea or vomiting.  She has taken tylenol without relief.     Past Medical History  Diagnosis Date  . Essential hypertension, benign   . IBS (irritable bowel syndrome)   . Premature ventricular contractions (PVCs) (VPCs)     Per Dr. Loleta Chance  . Type 2 diabetes mellitus (HCC)   . DVT (deep venous thrombosis) (HCC)    History reviewed. No pertinent past surgical history. Family History  Problem Relation Age of Onset  . Coronary artery disease Mother   . Coronary artery disease Father     Died age 76  . Hypertension Brother   . Arrhythmia Sister    Social History  Substance Use Topics  . Smoking status: Never Smoker   . Smokeless tobacco: Never Used  . Alcohol Use: No   OB History    No data available     Review of Systems  Constitutional: Negative for fever, chills, activity change and appetite change.  HENT: Positive for congestion, rhinorrhea and sore throat. Negative for facial swelling and trouble swallowing.   Eyes: Negative for visual disturbance.  Respiratory: Positive for cough. Negative for shortness of breath, wheezing and stridor.   Gastrointestinal: Negative for nausea and vomiting.  Musculoskeletal: Positive for myalgias. Negative for neck  pain and neck stiffness.  Skin: Negative.  Negative for rash.  Neurological: Negative for dizziness, weakness, numbness and headaches.  Hematological: Negative for adenopathy.  Psychiatric/Behavioral: Negative for confusion.  All other systems reviewed and are negative.     Allergies  Review of patient's allergies indicates no known allergies.  Home Medications   Prior to Admission medications   Medication Sig Start Date End Date Taking? Authorizing Provider  aspirin 81 MG tablet Take 81 mg by mouth daily.      Historical Provider, MD  lisinopril-hydrochlorothiazide (PRINZIDE,ZESTORETIC) 20-25 MG tablet Take 1 tablet by mouth daily. 01/04/15   Jodelle Gross, NP  spironolactone (ALDACTONE) 25 MG tablet Take 0.5 tablets (12.5 mg total) by mouth daily. 01/04/15   Jodelle Gross, NP   BP 164/74 mmHg  Pulse 89  Temp(Src) 98.7 F (37.1 C) (Oral)  Resp 16  Ht  (1.727 m)  Wt 124.739 kg  BMI 41.82 kg/m2  SpO2 100% Physical Exam  Constitutional: She is oriented to person, place, and time. She appears well-developed and well-nourished. No distress.  HENT:  Head: Normocephalic and atraumatic.  Right Ear: Tympanic membrane and ear canal normal.  Left Ear: Tympanic membrane and ear canal normal.  Mouth/Throat: Uvula is midline, oropharynx is clear and moist and mucous membranes are normal. No oropharyngeal exudate.  Eyes: EOM are normal. Pupils are equal, round, and reactive to light.  Neck: Normal range of motion, full passive range of motion without pain and phonation  normal. Neck supple.  Cardiovascular: Normal rate, regular rhythm, normal heart sounds and intact distal pulses.   No murmur heard. Pulmonary/Chest: Effort normal. No stridor. No respiratory distress. She has no rales. She exhibits no tenderness.  Coarse lungs sounds bilaterally. No rales.    Abdominal: Soft. She exhibits no distension. There is no tenderness. There is no rebound and no guarding.   Musculoskeletal: Normal range of motion. She exhibits no edema.  Lymphadenopathy:    She has no cervical adenopathy.  Neurological: She is alert and oriented to person, place, and time. She exhibits normal muscle tone. Coordination normal.  Skin: Skin is warm and dry.  Nursing note and vitals reviewed.   ED Course  Procedures (including critical care time) Labs Review Labs Reviewed  BASIC METABOLIC PANEL - Abnormal; Notable for the following:    Chloride 100 (*)    Glucose, Bld 137 (*)    All other components within normal limits  CBC WITH DIFFERENTIAL/PLATELET - Abnormal; Notable for the following:    WBC 3.3 (*)    Hemoglobin 11.5 (*)    MCH 24.2 (*)    RDW 16.9 (*)    Neutro Abs 1.3 (*)    All other components within normal limits  TROPONIN I    Imaging Review Dg Chest 2 View  03/29/2015  CLINICAL DATA:  Productive cough and fever since 03/27/2015. Chest pain. Initial encounter. EXAM: CHEST  2 VIEW COMPARISON:  Single view of the chest 09/27/2013. FINDINGS: The lungs are clear. Heart size is normal. No pneumothorax or pleural effusion. No focal bony abnormality. IMPRESSION: Negative chest. Electronically Signed   By: Drusilla Kanner M.D.   On: 03/29/2015 09:54   I have personally reviewed and evaluated these images and lab results as part of my medical decision-making.   EKG Interpretation   Date/Time:  Monday March 29 2015 11:00:29 EST Ventricular Rate:  81 PR Interval:  180 QRS Duration: 92 QT Interval:  390 QTC Calculation: 453 R Axis:   -18 Text Interpretation:  Normal sinus rhythm Non specific T wave changes  Confirmed by ZAVITZ  MD, JOSHUA ([id number]) on 03/29/2015 3:20:01 PM      MDM   Final diagnoses:  Bronchitis   Pt remains well appearing, non-toxic.  No tachycardia or hypoxia.  Labs, XR and EKG reassuring.  Low clinical suspicion for ACS or PE.  Sx's likely related to bronchitis and possible pleurisy secondary to coughing.   Pt agrees to close PMD  f/u and Rx for Z pack, prednisone and albuterol MDI dispensed.  Strict return precautions given, pt stable for d/c    Pauline Aus, PA-C 03/30/15 2048  Blane Ohara, MD 03/31/15 [phone]

## 2015-03-29 NOTE — Discharge Instructions (Signed)
How to Use an Inhaler  Using your inhaler correctly is very important. Good technique will make sure that the medicine reaches your lungs.   HOW TO USE AN INHALER:  1. Take the cap off the inhaler.  2. If this is the first time using your inhaler, you need to prime it. Shake the inhaler for 5 seconds. Release four puffs into the air, away from your face. Ask your doctor for help if you have questions.  3. Shake the inhaler for 5 seconds.  4. Turn the inhaler so the bottle is above the mouthpiece.  5. Put your pointer finger on top of the bottle. Your thumb holds the bottom of the inhaler.  6. Open your mouth.  7. Either hold the inhaler away from your mouth (the width of 2 fingers) or place your lips tightly around the mouthpiece. Ask your doctor which way to use your inhaler.  8. Breathe out as much air as possible.  9. Breathe in and push down on the bottle 1 time to release the medicine. You will feel the medicine go in your mouth and throat.  10. Continue to take a deep breath in very slowly. Try to fill your lungs.  11. After you have breathed in completely, hold your breath for 10 seconds. This will help the medicine to settle in your lungs. If you cannot hold your breath for 10 seconds, hold it for as long as you can before you breathe out.  12. Breathe out slowly, through pursed lips. Whistling is an example of pursed lips.  13. If your doctor has told you to take more than 1 puff, wait at least 15-30 seconds between puffs. This will help you get the best results from your medicine. Do not use the inhaler more than your doctor tells you to.  14. Put the cap back on the inhaler.  15. Follow the directions from your doctor or from the inhaler package about cleaning the inhaler.  If you use more than one inhaler, ask your doctor which inhalers to use and what order to use them in. Ask your doctor to help you figure out when you will need to refill your inhaler.   If you use a steroid inhaler, always rinse your  mouth with water after your last puff, gargle and spit out the water. Do not swallow the water.  GET HELP IF:  · The inhaler medicine only partially helps to stop wheezing or shortness of breath.  · You are having trouble using your inhaler.  · You have some increase in thick spit (phlegm).  GET HELP RIGHT AWAY IF:  · The inhaler medicine does not help your wheezing or shortness of breath or you have tightness in your chest.  · You have dizziness, headaches, or fast heart rate.  · You have chills, fever, or night sweats.  · You have a large increase of thick spit, or your thick spit is bloody.  MAKE SURE YOU:   · Understand these instructions.  · Will watch your condition.  · Will get help right away if you are not doing well or get worse.     This information is not intended to replace advice given to you by your health care provider. Make sure you discuss any questions you have with your health care provider.     Document Released: 10/26/2007 Document Revised: 11/06/2012 Document Reviewed: 08/15/2012  Elsevier Interactive Patient Education ©2016 [employer]

## 2015-03-29 NOTE — ED Notes (Signed)
Pt comes in with productive cough starting last Friday. Pt states she has been having left rib pain that worsens with cough.

## 2015-05-20 ENCOUNTER — Encounter: Payer: Self-pay | Admitting: Gastroenterology

## 2015-06-10 ENCOUNTER — Encounter: Payer: Self-pay | Admitting: Gastroenterology

## 2015-06-10 ENCOUNTER — Ambulatory Visit (INDEPENDENT_AMBULATORY_CARE_PROVIDER_SITE_OTHER): Payer: 59 | Admitting: Gastroenterology

## 2015-06-10 ENCOUNTER — Other Ambulatory Visit: Payer: Self-pay

## 2015-06-10 VITALS — BP 180/92 | HR 75 | Temp 98.0°F | Ht 68.0 in | Wt 274.6 lb

## 2015-06-10 DIAGNOSIS — D649 Anemia, unspecified: Secondary | ICD-10-CM

## 2015-06-10 DIAGNOSIS — K625 Hemorrhage of anus and rectum: Secondary | ICD-10-CM

## 2015-06-10 DIAGNOSIS — K5900 Constipation, unspecified: Secondary | ICD-10-CM

## 2015-06-10 LAB — IRON AND TIBC
%SAT: 7 % — AB (ref 11–50)
Iron: 31 ug/dL — ABNORMAL LOW (ref 40–190)
TIBC: 417 ug/dL (ref 250–450)
UIBC: 386 ug/dL (ref 125–400)

## 2015-06-10 LAB — FERRITIN: Ferritin: 24 ng/mL (ref 10–232)

## 2015-06-10 MED ORDER — PEG-KCL-NACL-NASULF-NA ASC-C 100 G PO SOLR: 1.0000 | ORAL | Status: DC

## 2015-06-10 NOTE — Progress Notes (Signed)
Primary Care Physician:  Evlyn Courier, MD Primary Gastroenterologist:  Dr. Darrick Penna   Chief Complaint  Patient presents with  . Rectal Bleeding  . Constipation  . Bloated  . set up TCS    HPI:   Melissa Casey is a 50 y.o. female presenting today at the request of Dr. Loleta Chance due to rectal bleeding, constipation, and need for colonoscopy. Outside labs with most recent Hgb 11.2. Appears that she has a chronic history of anemia.  States she noted some low-volume hematochezia Sunday and Monday. Saw a small amount. Heme positive. Has had some intermittent vaginal bleeding. Denies heavy menses. Having some irregular vaginal bleeding.   Notes chronic constipation. Will feel bloated, crampy. Has urgency to go and nothing happens. Sits a long time on the toilet.   Past Medical History  Diagnosis Date  . Essential hypertension, benign   . IBS (irritable bowel syndrome)   . Premature ventricular contractions (PVCs) (VPCs)     Per Dr. Loleta Chance  . Type 2 diabetes mellitus (HCC)   . DVT (deep venous thrombosis) (HCC)     remote past, over 20 years ago     Past Surgical History  Procedure Laterality Date  . None      Current Outpatient Prescriptions  Medication Sig Dispense Refill  . acetaminophen (TYLENOL) 500 MG tablet Take 1,000 mg by mouth every 6 (six) hours as needed.    . saxagliptin HCl (ONGLYZA) 2.5 MG TABS tablet Take 2.5 mg by mouth daily.    . valsartan-hydrochlorothiazide (DIOVAN-HCT) 160-25 MG tablet Take 1 tablet by mouth daily.     No current facility-administered medications for this visit.    Allergies as of 06/10/2015  . (No Known Allergies)    Family History  Problem Relation Age of Onset  . Coronary artery disease Mother   . Coronary artery disease Father     Died age 73  . Hypertension Brother   . Arrhythmia Sister   . Colon cancer Neg Hx     Social History   Social History  . Marital Status: Married    Spouse Name: N/A  . Number of Children: 1   . Years of Education: N/A   Occupational History  . School Dietitian  . [employer]     Part time  . [employer]      part time  .      Social History Main Topics  . Smoking status: Never Smoker   . Smokeless tobacco: Never Used  . Alcohol Use: No  . Drug Use: No  . Sexual Activity: Not on file   Other Topics Concern  . Not on file   Social History Narrative    Review of Systems: Gen: Denies any fever, chills, fatigue, weight loss, lack of appetite.  CV: Denies chest pain, heart palpitations, peripheral edema, syncope.  Resp: Denies shortness of breath at rest or with exertion. Denies wheezing or cough.  GI: see HPI  GU : Denies urinary burning, urinary frequency, urinary hesitancy MS: Denies joint pain, muscle weakness, cramps, or limitation of movement.  Derm: Denies rash, itching, dry skin Psych: Denies depression, anxiety, memory loss, and confusion  Physical Exam: BP 180/92 mmHg  Pulse 75  Temp(Src) 98 F (36.7 C)  Ht  (1.727 m)  Wt 274 lb 9.6 oz (124.558 kg)  BMI 41.76 kg/m2 General:   Alert and oriented. Pleasant and cooperative. Well-nourished and well-developed.  Head:  Normocephalic and  atraumatic. Eyes:  Without icterus, sclera clear and conjunctiva pink.  Ears:  Normal auditory acuity. Nose:  No deformity, discharge,  or lesions. Mouth:  No deformity or lesions, oral mucosa pink.  Lungs:  Clear to auscultation bilaterally. No wheezes, rales, or rhonchi. No distress.  Heart:  S1, S2 present without murmurs appreciated.  Abdomen:  +BS, soft, non-tender and non-distended. No HSM noted. No guarding or rebound. No masses appreciated.  Rectal:  Deferred  Msk:  Symmetrical without gross deformities. Normal posture. Extremities:  Without  edema. Neurologic:  Alert and  oriented x4;  grossly normal neurologically. Psych:  Alert and cooperative. Normal mood and affect.  Lab Results  Component Value Date   WBC 3.3* 03/29/2015   HGB 11.5*  03/29/2015   HCT 37.3 03/29/2015   MCV 78.4 03/29/2015   PLT 329 03/29/2015

## 2015-06-10 NOTE — Patient Instructions (Signed)
Please have blood work done today.  I have given you samples of Linzess to start taking. Take 1 capsule each morning on an empty stomach, 30 minutes before breakfast. This may give you some loose stool starting out, but it should get better. If you like it, call and let me know. I will send in a prescription.   We have scheduled you for a colonoscopy and possible upper endoscopy with Dr. Darrick PennaFields.

## 2015-06-16 NOTE — Progress Notes (Signed)
Quick Note:  Iron low at 31, ferritin low normal at 24. If colonoscopy unrevealing, recommend considering EGD at time of colonoscopy. ______

## 2015-06-16 NOTE — Assessment & Plan Note (Signed)
Chronic, denies heavy menses. Has noted some intermittent irregular vaginal bleeding. Check iron, ferritin, TIBC. If any evidence of IDA, would pursue EGD at time of colonoscopy. Discussed risks and benefits with stated understanding at time of visit.

## 2015-06-16 NOTE — Assessment & Plan Note (Signed)
Trial Linzess 145 mcg once daily. Samples provided.

## 2015-06-16 NOTE — Assessment & Plan Note (Signed)
50 year old very pleasant female with heme positive stool and intermittent low-volume hematochezia in the setting of constipation. No prior colonoscopy. Likely benign anorectal source but overdue for initial screening colonoscopy.   Proceed with colonoscopy with Dr. Darrick PennaFields in the near future. The risks, benefits, and alternatives have been discussed in detail with the patient. They state understanding and desire to proceed.  Trial Linzess 145 mcg once daily

## 2015-06-16 NOTE — Progress Notes (Signed)
Quick Note:  Pt is aware. ______ 

## 2015-06-16 NOTE — Progress Notes (Signed)
Quick Note:  LMOM to call. ______ 

## 2015-06-17 NOTE — Progress Notes (Signed)
cc'ed to pcp °

## 2015-07-01 NOTE — Progress Notes (Addendum)
REVIEWED-NO ADDITIONAL RECOMMENDATIONS. ENDO(AB) CALLED PT TO REMIND HER ABOUT APPT. MOVIPREP UNAVAILABLE. AB WILL CALL PT TO PICK UP HER SUPREP INSTEAD OF MOVIPREP AND WE WILL SEE HER TOMORROW FOR HER TCS.

## 2015-07-02 ENCOUNTER — Telehealth (INDEPENDENT_AMBULATORY_CARE_PROVIDER_SITE_OTHER): Payer: Self-pay | Admitting: Internal Medicine

## 2015-07-02 ENCOUNTER — Telehealth: Payer: Self-pay | Admitting: Gastroenterology

## 2015-07-02 NOTE — Telephone Encounter (Signed)
Pt is aware and set for 07/26/2015. Pt request for instructions to be faxed to [phone]

## 2015-07-02 NOTE — Telephone Encounter (Signed)
Pt called this morning to let us know that she is scheduled today for a EGD/TCS. Her prep prescription had to be ordered from her pharmacy and it didn't arrive in time. Pt called and spoke to NUR who was on call last time to let him know. NUR wrote her a prescription for Gavilyte and wanted to know would that be all right with SF and she prefers having her procedure on a Monday. Please call [phone]

## 2015-07-02 NOTE — Telephone Encounter (Signed)
REVIEWED-CALL PT TO SEE IF SHE WOULD LIKE TO RSC.

## 2015-07-02 NOTE — Telephone Encounter (Signed)
Called pt . Line busy. Will try to call again.

## 2015-07-02 NOTE — Telephone Encounter (Addendum)
Patient called yesterday evening to tell me there was mix up in her prep. SHe felt she would not be able to take the prep and wanted to reschedule colonoscopy.

## 2015-07-02 NOTE — Telephone Encounter (Signed)
Noted.  See other phone note.

## 2015-07-02 NOTE — Telephone Encounter (Signed)
LM with family member to have pt call office back when she got back home

## 2015-07-22 NOTE — Progress Notes (Signed)
Cardiology Office Note   Date:  07/22/2015   ID:  Melissa Casey, DOB October 27, 1965, MRN [id number]  PCP:  Evlyn CourierGerald K Hill, MD  Cardiologist: [place] CalixBranch/  Rayneisha Bouza, NP   No chief complaint on file.   ERROR Cancelled

## 2015-07-23 ENCOUNTER — Encounter: Payer: 59 | Admitting: Adult Health

## 2015-07-24 ENCOUNTER — Telehealth: Payer: Self-pay | Admitting: Gastroenterology

## 2015-07-24 NOTE — Telephone Encounter (Signed)
PT CALLED HAVING IRREGULAR VAGINAL BLEEDING. WANTS TO KNOW IF SHE CAN STILL HAVE HER TCS/EGD. PT TOLD TO COME ON MON AND FOLLOW PREP INSTRUCTIONS.

## 2015-07-26 ENCOUNTER — Encounter (HOSPITAL_COMMUNITY)
Admission: RE | Disposition: A | Discharge status: Home / Self Care | Payer: Self-pay | Source: Ambulatory Visit | Attending: Gastroenterology

## 2015-07-26 ENCOUNTER — Ambulatory Visit (HOSPITAL_COMMUNITY)
Admission: RE | Admit: 2015-07-26 | Discharge: 2015-07-26 | Disposition: A | Discharge status: Home / Self Care | Payer: 59 | Source: Ambulatory Visit | Attending: Gastroenterology | Admitting: Gastroenterology

## 2015-07-26 ENCOUNTER — Encounter (HOSPITAL_COMMUNITY): Payer: Self-pay | Admitting: *Deleted

## 2015-07-26 DIAGNOSIS — K2970 Gastritis, unspecified, without bleeding: Secondary | ICD-10-CM

## 2015-07-26 DIAGNOSIS — D649 Anemia, unspecified: Secondary | ICD-10-CM | POA: Insufficient documentation

## 2015-07-26 DIAGNOSIS — Z86718 Personal history of other venous thrombosis and embolism: Secondary | ICD-10-CM | POA: Diagnosis not present

## 2015-07-26 DIAGNOSIS — D500 Iron deficiency anemia secondary to blood loss (chronic): Secondary | ICD-10-CM

## 2015-07-26 DIAGNOSIS — E119 Type 2 diabetes mellitus without complications: Secondary | ICD-10-CM | POA: Insufficient documentation

## 2015-07-26 DIAGNOSIS — K648 Other hemorrhoids: Secondary | ICD-10-CM | POA: Insufficient documentation

## 2015-07-26 DIAGNOSIS — Q438 Other specified congenital malformations of intestine: Secondary | ICD-10-CM | POA: Insufficient documentation

## 2015-07-26 DIAGNOSIS — Z7982 Long term (current) use of aspirin: Secondary | ICD-10-CM | POA: Diagnosis not present

## 2015-07-26 DIAGNOSIS — K2950 Unspecified chronic gastritis without bleeding: Secondary | ICD-10-CM | POA: Diagnosis not present

## 2015-07-26 DIAGNOSIS — Z79899 Other long term (current) drug therapy: Secondary | ICD-10-CM | POA: Diagnosis not present

## 2015-07-26 DIAGNOSIS — K621 Rectal polyp: Secondary | ICD-10-CM | POA: Insufficient documentation

## 2015-07-26 DIAGNOSIS — I10 Essential (primary) hypertension: Secondary | ICD-10-CM | POA: Insufficient documentation

## 2015-07-26 DIAGNOSIS — K921 Melena: Secondary | ICD-10-CM

## 2015-07-26 DIAGNOSIS — K625 Hemorrhage of anus and rectum: Secondary | ICD-10-CM

## 2015-07-26 HISTORY — PX: COLONOSCOPY: SHX5424

## 2015-07-26 HISTORY — PX: ESOPHAGOGASTRODUODENOSCOPY: SHX5428

## 2015-07-26 LAB — GLUCOSE, CAPILLARY: GLUCOSE-CAPILLARY: 96 mg/dL (ref 65–99)

## 2015-07-26 SURGERY — COLONOSCOPY
Anesthesia: Moderate Sedation

## 2015-07-26 MED ORDER — OMEPRAZOLE 20 MG PO CPDR: DELAYED_RELEASE_CAPSULE | ORAL | Status: DC

## 2015-07-26 MED ORDER — MEPERIDINE HCL 100 MG/ML IJ SOLN
INTRAMUSCULAR | Status: AC
  Filled 2015-07-26: qty 2

## 2015-07-26 MED ORDER — MEPERIDINE HCL 100 MG/ML IJ SOLN
INTRAMUSCULAR | Status: DC | PRN
  Administered 2015-07-26: 25 mg via INTRAVENOUS
  Administered 2015-07-26: 50 mg via INTRAVENOUS
  Administered 2015-07-26: 25 mg via INTRAVENOUS

## 2015-07-26 MED ORDER — LIDOCAINE VISCOUS 2 % MT SOLN
OROMUCOSAL | Status: AC
  Filled 2015-07-26: qty 15

## 2015-07-26 MED ORDER — MIDAZOLAM HCL 5 MG/5ML IJ SOLN
INTRAMUSCULAR | Status: DC | PRN
  Administered 2015-07-26 (×3): 2 mg via INTRAVENOUS
  Administered 2015-07-26: 1 mg via INTRAVENOUS

## 2015-07-26 MED ORDER — SODIUM CHLORIDE 0.9 % IV SOLN
INTRAVENOUS | Status: DC
  Administered 2015-07-26: 13:00:00 via INTRAVENOUS

## 2015-07-26 MED ORDER — MIDAZOLAM HCL 5 MG/5ML IJ SOLN
INTRAMUSCULAR | Status: AC
  Filled 2015-07-26: qty 10

## 2015-07-26 MED ORDER — LIDOCAINE VISCOUS 2 % MT SOLN
OROMUCOSAL | Status: DC | PRN
  Administered 2015-07-26: 4 mL via OROMUCOSAL

## 2015-07-26 MED ORDER — SIMETHICONE 40 MG/0.6ML PO SUSP
ORAL | Status: DC | PRN
  Administered 2015-07-26: 15:00:00

## 2015-07-26 NOTE — Discharge Instructions (Signed)
You have small and moderate internal hemorrhoids, and HAD 2 small rectal polyps removed. You have mild gastritis and duodenitis.I biopsied your stomach.    FOLLOW A HIGH FIBER/LOW FAT DIET. AVOID ITEMS THAT CAUSE BLOATING. SEE INFO BELOW.  START OMEPRAZOLE. TAKE 30 MINUTES PRIOR TO YOUR FIRST MEAL.  YOUR BIOPSY RESULTS WILL BE AVAILABLE IN MY CHART  JUN 28 AND MY OFFICE WILL CONTACT YOU IN 10-14 DAYS WITH YOUR RESULTS.   FOLLOW UP IN 4 MOS. WE WILL RECHECK YOUR BLOOD COUNT ONE WEEK PRIOR TO YOUR NEXT VISIT. Next colonoscopy in 5-10 years.   ENDOSCOPY Care After Read the instructions outlined below and refer to this sheet in the next week. These discharge instructions provide you with general information on caring for yourself after you leave the hospital. While your treatment has been planned according to the most current medical practices available, unavoidable complications occasionally occur. If you have any problems or questions after discharge, call DR. Larsen Zettel, [phone].  ACTIVITY  You may resume your regular activity, but move at a slower pace for the next 24 hours.   Take frequent rest periods for the next 24 hours.   Walking will help get rid of the air and reduce the bloated feeling in your belly (abdomen).   No driving for 24 hours (because of the medicine (anesthesia) used during the test).   You may shower.   Do not sign any important legal documents or operate any machinery for 24 hours (because of the anesthesia used during the test).    NUTRITION  Drink plenty of fluids.   You may resume your normal diet as instructed by your doctor.   Begin with a light meal and progress to your normal diet. Heavy or fried foods are harder to digest and may make you feel sick to your stomach (nauseated).   Avoid alcoholic beverages for 24 hours or as instructed.    MEDICATIONS  You may resume your normal medications.   WHAT YOU CAN EXPECT TODAY  Some feelings of  bloating in the abdomen.   Passage of more gas than usual.   Spotting of blood in your stool or on the toilet paper  .  IF YOU HAD POLYPS REMOVED DURING THE ENDOSCOPY:  Eat a soft diet IF YOU HAVE NAUSEA, BLOATING, ABDOMINAL PAIN, OR VOMITING.    FINDING OUT THE RESULTS OF YOUR TEST Not all test results are available during your visit. DR. Darrick PennaFIELDS WILL CALL YOU WITHIN 14 DAYS OF YOUR PROCEDUE WITH YOUR RESULTS. Do not assume everything is normal if you have not heard from DR. Merlina Marchena, CALL HER OFFICE AT [phone].  SEEK IMMEDIATE MEDICAL ATTENTION AND CALL THE OFFICE: [phone] IF:  You have more than a spotting of blood in your stool.   Your belly is swollen (abdominal distention).   You are nauseated or vomiting.   You have a temperature over 101F.   You have abdominal pain or discomfort that is severe or gets worse throughout the day.  Polyps, Colon  A polyp is extra tissue that grows inside your body. Colon polyps grow in the large intestine. The large intestine, also called the colon, is part of your digestive system. It is a long, hollow tube at the end of your digestive tract where your body makes and stores stool. Most polyps are not dangerous. They are benign. This means they are not cancerous. But over time, some types of polyps can turn into cancer. Polyps that are smaller than a  pea are usually not harmful. But larger polyps could someday become or may already be cancerous. To be safe, doctors remove all polyps and test them.   PREVENTION There is not one sure way to prevent polyps. You might be able to lower your risk of getting them if you:  Eat more fruits and vegetables and less fatty food.   Do not smoke.   Avoid alcohol.   Exercise every day.   Lose weight if you are overweight.   Eating more calcium and folate can also lower your risk of getting polyps. Some foods that are rich in calcium are milk, cheese, and broccoli. Some foods that are rich in  folate are chickpeas, kidney beans, and spinach.    Gastritis  Gastritis is an inflammation (the body's way of reacting to injury and/or infection) of the stomach. It is often caused by viral or bacterial (germ) infections. It can also be caused BY ASPIRIN, BC/GOODY POWDER'S, (IBUPROFEN) MOTRIN, OR ALEVE (NAPROXEN), chemicals (including alcohol), SPICY FOODS, and medications. This illness may be associated with generalized malaise (feeling tired, not well), UPPER ABDOMINAL STOMACH cramps, and fever. One common bacterial cause of gastritis is an organism known as H. Pylori. This can be treated with antibiotics.    High-Fiber Diet A high-fiber diet changes your normal diet to include more whole grains, legumes, fruits, and vegetables. Changes in the diet involve replacing refined carbohydrates with unrefined foods. The calorie level of the diet is essentially unchanged. The Dietary Reference Intake (recommended amount) for adult males is 38 grams per day. For adult females, it is 25 grams per day. Pregnant and lactating women should consume 28 grams of fiber per day. Fiber is the intact part of a plant that is not broken down during digestion. Functional fiber is fiber that has been isolated from the plant to provide a beneficial effect in the body. PURPOSE  Increase stool bulk.   Ease and regulate bowel movements.   Lower cholesterol.  REDUCE RISK OF COLON CANCER  INDICATIONS THAT YOU NEED MORE FIBER  Constipation and hemorrhoids.   Uncomplicated diverticulosis (intestine condition) and irritable bowel syndrome.   Weight management.   As a protective measure against hardening of the arteries (atherosclerosis), diabetes, and cancer.   GUIDELINES FOR INCREASING FIBER IN THE DIET  Start adding fiber to the diet slowly. A gradual increase of about 5 more grams (2 slices of whole-wheat bread, 2 servings of most fruits or vegetables, or 1 bowl of high-fiber cereal) per day is best. Too rapid  an increase in fiber may result in constipation, flatulence, and bloating.   Drink enough water and fluids to keep your urine clear or pale yellow. Water, juice, or caffeine-free drinks are recommended. Not drinking enough fluid may cause constipation.   Eat a variety of high-fiber foods rather than one type of fiber.   Try to increase your intake of fiber through using high-fiber foods rather than fiber pills or supplements that contain small amounts of fiber.   The goal is to change the types of food eaten. Do not supplement your present diet with high-fiber foods, but replace foods in your present diet.   INCLUDE A VARIETY OF FIBER SOURCES  Replace refined and processed grains with whole grains, canned fruits with fresh fruits, and incorporate other fiber sources. White rice, white breads, and most bakery goods contain little or no fiber.   Brown whole-grain rice, buckwheat oats, and many fruits and vegetables are all good sources of fiber.  These include: broccoli, Brussels sprouts, cabbage, cauliflower, beets, sweet potatoes, white potatoes (skin on), carrots, tomatoes, eggplant, squash, berries, fresh fruits, and dried fruits.   Cereals appear to be the richest source of fiber. Cereal fiber is found in whole grains and bran. Bran is the fiber-rich outer coat of cereal grain, which is largely removed in refining. In whole-grain cereals, the bran remains. In breakfast cereals, the largest amount of fiber is found in those with "bran" in their names. The fiber content is sometimes indicated on the label.   You may need to include additional fruits and vegetables each day.   In baking, for 1 cup white flour, you may use the following substitutions:   1 cup whole-wheat flour minus 2 tablespoons.   1/2 cup white flour plus 1/2 cup whole-wheat flour.   Low-Fat Diet BREADS, CEREALS, PASTA, RICE, DRIED PEAS, AND BEANS These products are high in carbohydrates and most are low in fat.  Therefore, they can be increased in the diet as substitutes for fatty foods. They too, however, contain calories and should not be eaten in excess. Cereals can be eaten for snacks as well as for breakfast.  Include foods that contain fiber (fruits, vegetables, whole grains, and legumes). Research shows that fiber may lower blood cholesterol levels, especially the water-soluble fiber found in fruits, vegetables, oat products, and legumes. FRUITS AND VEGETABLES It is good to eat fruits and vegetables. Besides being sources of fiber, both are rich in vitamins and some minerals. They help you get the daily allowances of these nutrients. Fruits and vegetables can be used for snacks and desserts. MEATS Limit lean meat, chicken, [place], and fish to no more than 6 ounces per day. Beef, Pork, and Lamb Use lean cuts of beef, pork, and lamb. Lean cuts include:  Extra-lean ground beef.  Arm roast.  Sirloin tip.  Center-cut ham.  Round steak.  Loin chops.  Rump roast.  Tenderloin.  Trim all fat off the outside of meats before cooking. It is not necessary to severely decrease the intake of red meat, but lean choices should be made. Lean meat is rich in protein and contains a highly absorbable form of iron. Premenopausal women, in particular, should avoid reducing lean red meat because this could increase the risk for low red blood cells (iron-deficiency anemia). The organ meats, such as liver, sweetbreads, kidneys, and brain are very rich in cholesterol. They should be limited. Chicken and [place] These are good sources of protein. The fat of poultry can be reduced by removing the skin and underlying fat layers before cooking. Chicken and [place] can be substituted for lean red meat in the diet. Poultry should not be fried or covered with high-fat sauces. Fish and Shellfish Fish is a good source of protein. Shellfish contain cholesterol, but they usually are low in saturated fatty acids. The preparation of fish  is important. Like chicken and [place], they should not be fried or covered with high-fat sauces. EGGS Egg whites contain no fat or cholesterol. They can be eaten often. Try 1 to 2 egg whites instead of whole eggs in recipes or use egg substitutes that do not contain yolk. MILK AND DAIRY PRODUCTS Use skim or 1% milk instead of 2% or whole milk. Decrease whole milk, natural, and processed cheeses. Use nonfat or low-fat (2%) cottage cheese or low-fat cheeses made from vegetable oils. Choose nonfat or low-fat (1 to 2%) yogurt. Experiment with evaporated skim milk in recipes that call for heavy cream. Substitute  low-fat yogurt or low-fat cottage cheese for sour cream in dips and salad dressings. Have at least 2 servings of low-fat dairy products, such as 2 glasses of skim (or 1%) milk each day to help get your daily calcium intake.  FATS AND OILS Reduce the total intake of fats, especially saturated fat. Butterfat, lard, and beef fats are high in saturated fat and cholesterol. These should be avoided as much as possible. Vegetable fats do not contain cholesterol, but certain vegetable fats, such as coconut oil, palm oil, and palm kernel oil are very high in saturated fats. These should be limited. These fats are often used in bakery goods, processed foods, popcorn, oils, and nondairy creamers. Vegetable shortenings and some peanut butters contain hydrogenated oils, which are also saturated fats. Read the labels on these foods and check for saturated vegetable oils. Unsaturated vegetable oils and fats do not raise blood cholesterol. However, they should be limited because they are fats and are high in calories. Total fat should still be limited to 30% of your daily caloric intake. Desirable liquid vegetable oils are corn oil, cottonseed oil, olive oil, canola oil, safflower oil, soybean oil, and sunflower oil. Peanut oil is not as good, but small amounts are acceptable. Buy a heart-healthy tub margarine that has  no partially hydrogenated oils in the ingredients. Mayonnaise and salad dressings often are made from unsaturated fats, but they should also be limited because of their high calorie and fat content. Seeds, nuts, peanut butter, olives, and avocados are high in fat, but the fat is mainly the unsaturated type. These foods should be limited mainly to avoid excess calories and fat. OTHER EATING TIPS Snacks  Most sweets should be limited as snacks. They tend to be rich in calories and fats, and their caloric content outweighs their nutritional value. Some good choices in snacks are graham crackers, melba toast, soda crackers, bagels (no egg), English muffins, fruits, and vegetables. These snacks are preferable to snack crackers, [place] fries, and chips. Popcorn should be air-popped or cooked in small amounts of liquid vegetable oil. Desserts Eat fruit, low-fat yogurt, and fruit ices. AVOID pastries, cake, and cookies. Sherbet, angel food cake, gelatin dessert, frozen low-fat yogurt, or other frozen products that do not contain saturated fat (pure fruit juice bars, frozen ice pops) are also acceptable.  COOKING METHODS Choose those methods that use little or no fat. They include: Poaching.  Braising.  Steaming.  Grilling.  Baking.  Stir-frying.  Broiling.  Microwaving.  Foods can be cooked in a nonstick pan without added fat, or use a nonfat cooking spray in regular cookware. Limit fried foods and avoid frying in saturated fat. Add moisture to lean meats by using water, broth, cooking wines, and other nonfat or low-fat sauces along with the cooking methods mentioned above. Soups and stews should be chilled after cooking. The fat that forms on top after a few hours in the refrigerator should be skimmed off. When preparing meals, avoid using excess salt. Salt can contribute to raising blood pressure in some people. EATING AWAY FROM HOME Order entres, potatoes, and vegetables without sauces or butter.  When meat exceeds the size of a deck of cards (3 to 4 ounces), the rest can be taken home for another meal. Choose vegetable or fruit salads and ask for low-calorie salad dressings to be served on the side. Use dressings sparingly. Limit high-fat toppings, such as bacon, crumbled eggs, cheese, sunflower seeds, and olives. Ask for heart-healthy tub margarine instead of  butter.   Hemorrhoids Hemorrhoids are dilated (enlarged) veins around the rectum. Sometimes clots will form in the veins. This makes them swollen and painful. These are called thrombosed hemorrhoids. Causes of hemorrhoids include:  Constipation.   Straining to have a bowel movement.   HEAVY LIFTING HOME CARE INSTRUCTIONS  Eat a well balanced diet and drink 6 to 8 glasses of water every day to avoid constipation. You may also use a bulk laxative.   Avoid straining to have bowel movements.   Keep anal area dry and clean.   Do not use a donut shaped pillow or sit on the toilet for long periods. This increases blood pooling and pain.   Move your bowels when your body has the urge; this will require less straining and will decrease pain and pressure.

## 2015-07-26 NOTE — H&P (Signed)
  Primary Care Physician:  Maggie Font, MD Primary Gastroenterologist:  Dr. Oneida Alar  Pre-Procedure History & Physical: HPI:  Melissa Casey is a 50 y.o. female here for RECTAL BLEEDING/ANEMIA.  Past Medical History  Diagnosis Date  . Essential hypertension, benign   . IBS (irritable bowel syndrome)   . Premature ventricular contractions (PVCs) (VPCs)     Per Dr. Berdine Addison  . Type 2 diabetes mellitus ([facility] [place] [facility])   . DVT (deep venous thrombosis) ([place])     remote past, over 20 years ago     Past Surgical History  Procedure Laterality Date  . None      Prior to Admission medications   Medication Sig Start Date End Date Taking? Authorizing Provider  acetaminophen (TYLENOL) 500 MG tablet Take 1,000 mg by mouth every 6 (six) hours as needed.   Yes Historical Provider, MD  aspirin EC 81 MG tablet Take 81 mg by mouth daily.   Yes Historical Provider, MD  benzonatate (TESSALON) 100 MG capsule Take 100 mg by mouth 3 (three) times daily as needed for cough.   Yes Historical Provider, MD  peg 3350 powder (MOVIPREP) 100 g SOLR Take 1 kit (200 g total) by mouth as directed. 06/10/15  Yes Danie Binder, MD  saxagliptin HCl (ONGLYZA) 2.5 MG TABS tablet Take 2.5 mg by mouth daily.   Yes Historical Provider, MD  sodium phosphate Pediatric (FLEET) 3.5-9.5 GM/59ML enema Place 1 enema rectally once.   Yes Historical Provider, MD  valsartan-hydrochlorothiazide (DIOVAN-HCT) 160-25 MG tablet Take 1 tablet by mouth daily.   Yes Historical Provider, MD    Allergies as of 06/10/2015  . (No Known Allergies)    Family History  Problem Relation Age of Onset  . Coronary artery disease Mother   . Coronary artery disease Father     Died age 33  . Hypertension Brother   . Arrhythmia Sister   . Colon cancer Neg Hx     Social History   Social History  . Marital Status: Married    Spouse Name: N/A  . Number of Children: 1  . Years of Education: N/A   Occupational History  . School Environmental education officer   . [employer]     Part time  . [employer]      part time  .      Social History Main Topics  . Smoking status: Never Smoker   . Smokeless tobacco: Never Used  . Alcohol Use: No  . Drug Use: No  . Sexual Activity: Not on file   Other Topics Concern  . Not on file   Social History Narrative    Review of Systems: See HPI, otherwise negative ROS   Physical Exam: BP 151/74 mmHg  Pulse 78  Temp(Src) 98.9 F (37.2 C) (Oral)  Resp 21  Ht 5' 9" (1.753 m)  Wt 274 lb (124.286 kg)  BMI 40.44 kg/m2  SpO2 99%  LMP 07/24/2015 General:   Alert,  pleasant and cooperative in NAD Head:  Normocephalic and atraumatic. Neck:  Supple; Lungs:  Clear throughout to auscultation.    Heart:  Regular rate and rhythm. Abdomen:  Soft, nontender and nondistended. Normal bowel sounds, without guarding, and without rebound.   Neurologic:  Alert and  oriented x4;  grossly normal neurologically.  Impression/Plan:     RECTAL BLEEDING/ANEMIA  PLAN:  1. TCS/EGD TODAY

## 2015-07-26 NOTE — Op Note (Signed)
[facility] Patient Name: Melissa Casey Procedure Date: 07/26/2015 2:50 PM MRN: [id number] Date of Birth: 1965/09/07 Attending MD: Jonette EvaSandi Izzy Courville , MD CSN: [id number] Age: 50 Admit Type: Outpatient Procedure:                Colonoscopy Indications:              Hematochezia, Iron deficiency anemia secondary to                            chronic blood loss Providers:                Jonette EvaSandi Yulisa Chirico, MD, Loma MessingLurae B. Patsy LagerAlbert RN, RN, Birder Robsonebra                            [place], Technician Referring MD:              Medicines:                Meperidine 100 mg IV, Midazolam 7 mg IV Complications:            No immediate complications. Estimated Blood Loss:     Estimated blood loss was minimal. Procedure:                Pre-Anesthesia Assessment:                           - Prior to the procedure, a History and Physical                            was performed, and patient medications and                            allergies were reviewed. The patient's tolerance of                            previous anesthesia was also reviewed. The risks                            and benefits of the procedure and the sedation                            options and risks were discussed with the patient.                            All questions were answered, and informed consent                            was obtained. Prior Anticoagulants: The patient has                            taken aspirin, last dose was day of procedure. ASA                            Grade Assessment: II - A patient with mild systemic  disease. After reviewing the risks and benefits,                            the patient was deemed in satisfactory condition to                            undergo the procedure.                           After obtaining informed consent, the colonoscope                            was passed under direct vision. Throughout the                            procedure, the  patient's blood pressure, pulse, and                            oxygen saturations were monitored continuously. The                            EC-[id number] ([id number]) was introduced through the anus                            and advanced to the 10 cm into the ileum. The                            colonoscopy was somewhat difficult due to a                            tortuous colon. Successful completion of the                            procedure was aided by changing the patient to a                            supine position, using manual pressure,                            straightening and shortening the scope to obtain                            bowel loop reduction and COLOWRAP. The patient                            tolerated the procedure well. The quality of the                            bowel preparation was excellent. The terminal                            ileum, ileocecal valve, appendiceal orifice, and  rectum were photographed. Scope In: 3:10:34 PM Scope Out: 3:29:01 PM Scope Withdrawal Time: 0 hours 12 minutes 46 seconds  Total Procedure Duration: 0 hours 18 minutes 27 seconds  Findings:      The terminal ileum appeared normal.      The recto-sigmoid colon, sigmoid colon and descending colon were       significantly redundant. COLOWRAP      Two sessile polyps were found in the rectum. The polyps were 2 to 4 mm       in size. These polyps were removed with a cold biopsy forceps. Resection       and retrieval were complete.      Internal hemorrhoids were found. The hemorrhoids were small.      Non-bleeding external hemorrhoids were found. The hemorrhoids were       moderate. Impression:               - The examined portion of the ileum was normal.                           - SEVERELY Redundant colon.                           - Two polyps in the rectum, removed                           - INTERMITTENT RECTAL BLEEDING MOST LIKELY DUE TO                             Internal hemorrhoids.                           - NO SOURCE FOR ANEMIA IDENTIFIED Moderate Sedation:      Moderate (conscious) sedation was administered by the endoscopy nurse       and supervised by the endoscopist. The following parameters were       monitored: oxygen saturation, heart rate, blood pressure, and response       to care. Total physician intraservice time was 43 minutes. Recommendation:           - High fiber diet.                           - Continue present medications.                           - Await pathology results.                           - Repeat colonoscopy in 5-10 years for surveillance.                           - Return to GI office in 3 months.                           START OMEPRAZOLE. TAKE 30 MINUTES PRIOR TO YOUR                            FIRST MEAL.  RECHECK YOUR BLOOD COUNT ONE WEEK PRIOR TO YOUR                            NEXT VISIT.                           - Patient has a contact number available for                            emergencies. The signs and symptoms of potential                            delayed complications were discussed with the                            patient. Return to normal activities tomorrow.                            Written discharge instructions were provided to the                            patient. Procedure Code(s):        --- Professional ---                           [phone], Colonoscopy, flexible; with biopsy, single                            or multiple                           99153, Moderate sedation services; each additional                            15 minutes intraservice time                           99153, Moderate sedation services; each additional                            15 minutes intraservice time                           G0500, Moderate sedation services provided by the                            same physician or other qualified health care                             professional performing a gastrointestinal                            endoscopic service that sedation supports,                            requiring the presence of an independent trained  observer to assist in the monitoring of the                            patient's level of consciousness and physiological                            status; initial 15 minutes of intra-service time;                            patient age 50 years or older (additional time may                            be reported with 16109, as appropriate) Diagnosis Code(s):        --- Professional ---                           K64.4, Residual hemorrhoidal skin tags                           K64.8, Other hemorrhoids                           K62.1, Rectal polyp                           K92.1, Melena (includes Hematochezia)                           D50.0, Iron deficiency anemia secondary to blood                            loss (chronic)                           Q43.8, Other specified congenital malformations of                            intestine CPT copyright 2016 American Medical Association. All rights reserved. The codes documented in this report are preliminary and upon coder review may  be revised to meet current compliance requirements. Jonette Eva, MD Jonette Eva, MD 07/26/2015 8:44:45 PM This report has been signed electronically. Number of Addenda: 0

## 2015-07-26 NOTE — Op Note (Signed)
[facility] Patient Name: Malayia Spizzirri Procedure Date: 07/26/2015 3:32 PM MRN: [id number] Date of Birth: 1965/03/20 Attending MD: Jonette Eva , MD CSN: [id number] Age: 50 Admit Type: Outpatient Procedure:                Upper GI endoscopy WITH COLD FORCEPS BIOPSY Indications:              Suspected upper gastrointestinal bleeding in                            patient with chronic blood loss Providers:                Jonette Eva, MD, Brain Hilts RN, RN, Birder Robson, Technician Referring MD:             Annia Friendly. Hill, MD Medicines:                TCS + Midazolam 2 mg IV Complications:            No immediate complications. Estimated Blood Loss:     Estimated blood loss was minimal. Procedure:                Pre-Anesthesia Assessment:                           - Prior to the procedure, a History and Physical                            was performed, and patient medications and                            allergies were reviewed. The patient's tolerance of                            previous anesthesia was also reviewed. The risks                            and benefits of the procedure and the sedation                            options and risks were discussed with the patient.                            All questions were answered, and informed consent                            was obtained. Prior Anticoagulants: The patient has                            taken aspirin, last dose was day of procedure. ASA                            Grade Assessment: II - A patient with mild systemic  disease. After reviewing the risks and benefits,                            the patient was deemed in satisfactory condition to                            undergo the procedure.                           After obtaining informed consent, the endoscope was                            passed under direct vision. Throughout the                             procedure, the patient's blood pressure, pulse, and                            oxygen saturations were monitored continuously. The                            [id number] ([id number]) scope was introduced through the                            mouth, and advanced to the second part of duodenum.                            The patient tolerated the procedure well. The upper                            GI endoscopy was accomplished without difficulty. Scope In: 3:35:33 PM Scope Out: 3:40:21 PM Total Procedure Duration: 0 hours 4 minutes 48 seconds  Findings:      The examined esophagus was normal.      Scattered mild inflammation characterized by congestion (edema),       erosions and erythema was found in the stomach. Biopsies were taken with       a cold forceps for Helicobacter pylori testing.      Patchy mildly erythematous mucosa without active bleeding and with no       stigmata of bleeding was found in the duodenal bulb. Impression:               - ANEMIA MOST LIKELY DUE TO ASA USE WITHOUT A PPI                           - DUODENITIS/Gastritis, MILD . Moderate Sedation:      Moderate (conscious) sedation was administered by the endoscopy nurse       and supervised by the endoscopist. The following parameters were       monitored: oxygen saturation, heart rate, blood pressure, and response       to care. Total physician intraservice time was 43 minutes. Recommendation:           - High fiber diet.                           -  Continue present medications.                           - Await pathology results.                           - Return to my office in 4 months.                           START OMEPRAZOLE. TAKE 30 MINUTES PRIOR TO YOUR                            FIRST MEAL.                           RECHECK CBC ONE WEEK PRIOR TO YOUR NEXT VISIT.                           - Patient has a contact number available for                            emergencies. The signs and  symptoms of potential                            delayed complications were discussed with the                            patient. Return to normal activities tomorrow.                            Written discharge instructions were provided to the                            patient. Procedure Code(s):        --- Professional ---                           [phone], Esophagogastroduodenoscopy, flexible,                            transoral; with biopsy, single or multiple                           99153, Moderate sedation services; each additional                            15 minutes intraservice time                           99153, Moderate sedation services; each additional                            15 minutes intraservice time                           G0500, Moderate sedation services provided by the  same physician or other qualified health care                            professional performing a gastrointestinal                            endoscopic service that sedation supports,                            requiring the presence of an independent trained                            observer to assist in the monitoring of the                            patient's level of consciousness and physiological                            status; initial 15 minutes of intra-service time;                            patient age 64 years or older (additional time may                            be reported with [id number], as appropriate) Diagnosis Code(s):        --- Professional ---                           K29.70, Gastritis, unspecified, without bleeding                           R58, Hemorrhage, not elsewhere classified CPT copyright 2016 American Medical Association. All rights reserved. The codes documented in this report are preliminary and upon coder review may  be revised to meet current compliance requirements. Jonette EvaSandi Duante Arocho, MD Jonette EvaSandi Jamyra Zweig, MD 07/26/2015 8:51:25  PM This report has been signed electronically. Number of Addenda: 0

## 2015-07-29 ENCOUNTER — Encounter: Payer: 59 | Admitting: Adult Health

## 2015-07-29 NOTE — Progress Notes (Signed)
Cardiology Office Note   Date:  07/29/2015   ID:  Melissa Casey, DOB 09-Aug-1965, MRN [id number]  PCP:  Evlyn CourierGerald K Hill, MD  Cardiologist: Arlington CalixBranch/  Kathryn Lawrence, NP   ERROR Rescheduled

## 2015-07-30 ENCOUNTER — Encounter (HOSPITAL_COMMUNITY): Payer: Self-pay | Admitting: Gastroenterology

## 2015-08-09 ENCOUNTER — Encounter: Payer: Self-pay | Admitting: Adult Health

## 2015-08-09 ENCOUNTER — Ambulatory Visit (INDEPENDENT_AMBULATORY_CARE_PROVIDER_SITE_OTHER): Payer: 59 | Admitting: Adult Health

## 2015-08-09 VITALS — BP 164/84 | HR 89 | Ht 68.0 in | Wt 274.0 lb

## 2015-08-09 DIAGNOSIS — I10 Essential (primary) hypertension: Secondary | ICD-10-CM | POA: Diagnosis not present

## 2015-08-09 NOTE — Progress Notes (Signed)
Cardiology Office Note   Date:  08/09/2015   ID:  Melissa Casey, DOB [birth year], MRN [id number]  PCP:  Evlyn Courier, MD  Cardiologist:  Arlington Calix, NP   No chief complaint on file.     History of Present Illness: Melissa Casey is a 50 y.o. female who presents for ongoing assessment and management of hypertension, with history of palpitations and atypical chest pain. Other history includes diabetes, irritable bowel syndrome, and history of DVT. She was seen in the office on 01/04/2015 after experiencing neck and shoulder pain with some numbness in her hands.at the time of that office visit was felt that this was musculoskeletal pain left cervical neck and shoulder discomfort. A cervical spine series was completed and a copy was sent to her PCP. She was given her prescription for tramadol 50 mg with no refills to be followed by PCP with test results resulted.  Cervical spine did not find any cervical spine abnormality or significant disc disease. Spell she was having muscle spasms. She was recommended for an MRI. This was deferred to PCP.  She is no further complaints of neck and shoulder pain since being seen last. She had a colonoscopy and EGD last month which showed some rectal polyps and some gastritis. She was taken of aspirin. She states she was not told to do so and has not gotten any results from her procedure. She should hear back from GI. Past Medical History  Diagnosis Date  . Essential hypertension, benign   . IBS (irritable bowel syndrome)   . Premature ventricular contractions (PVCs) (VPCs)     Per Dr. Loleta Chance  . Type 2 diabetes mellitus ([facility])   . DVT (deep venous thrombosis) ([facility])     remote past, over 20 years ago     Past Surgical History  Procedure Laterality Date  . None    . Colonoscopy N/A 07/26/2015    Procedure: COLONOSCOPY;  Surgeon: West Bali, MD;  Location: [facility];  Service: Endoscopy;  Laterality: N/A;  130 - moved to 6/26  :30 - office to notify pt  . Esophagogastroduodenoscopy N/A 07/26/2015    Procedure: ESOPHAGOGASTRODUODENOSCOPY (EGD);  Surgeon: West Bali, MD;  Location: [facility];  Service: Endoscopy;  Laterality: N/A;     Current Outpatient Prescriptions  Medication Sig Dispense Refill  . acetaminophen (TYLENOL) 500 MG tablet Take 1,000 mg by mouth every 6 (six) hours as needed.    Marland Kitchen aspirin EC 81 MG tablet Take 81 mg by mouth daily.    . benzonatate (TESSALON) 100 MG capsule Take 100 mg by mouth 3 (three) times daily as needed for cough.    Marland Kitchen omeprazole (PRILOSEC) 20 MG capsule 1 po every morning 30 minutes prior to your first meal. 90 capsule 3  . valsartan-hydrochlorothiazide (DIOVAN-HCT) 160-25 MG tablet Take 1 tablet by mouth daily.     No current facility-administered medications for this visit.    Allergies:   Review of patient's allergies indicates no known allergies.    Social History:  The patient  reports that she has never smoked. She has never used smokeless tobacco. She reports that she does not drink alcohol or use illicit drugs.   Family History:  The patient's family history includes Arrhythmia in her sister; Coronary artery disease in her father and mother; Hypertension in her brother. There is no history of Colon cancer.    ROS: All other systems are reviewed and negative. Unless otherwise mentioned  in H&P    PHYSICAL EXAM: VS:  BP 164/84 mmHg  Pulse 89  Ht 5\' 8"  (1.727 m)  Wt 274 lb (124.286 kg)  BMI 41.67 kg/m2  SpO2 93% , BMI Body mass index is 41.67 kg/(m^2). GEN: Well nourished, well developed, in no acute distress HEENT: normal Neck: no JVD, carotid bruits, or masses Cardiac: RRR; no murmurs, rubs, or gallops,no edema  Respiratory:  clear to auscultation bilaterally, normal work of breathing GI: soft, nontender, nondistended, + BS MS: no deformity or atrophy Skin: warm and dry, no rash Neuro:  Strength and sensation are intact Psych: euthymic mood,  full affect  Recent Labs: 03/29/2015: BUN 12; Creatinine, Ser 0.62; Hemoglobin 11.5*; Platelets 329; Potassium 3.5; Sodium 140    Lipid Panel No results found for: CHOL, TRIG, HDL, CHOLHDL, VLDL, LDLCALC, LDLDIRECT    Wt Readings from Last 3 Encounters:  08/09/15 274 lb (124.286 kg)  07/26/15 274 lb (124.286 kg)  06/10/15 274 lb 9.6 oz (124.558 kg)    ASSESSMENT AND PLAN:  1.  Hypertension:blood pressure is currently well-controlled on valsartan HCTZ. We'll take her off aspirin as this was recommended after colonoscopy and EGD, due to gastritis and colon polyps which were causing some mild bleeding. I've given her a copy of her colonoscopy and EGD report.  2. GERD:she has been started on on omeprazole by GI. She is advised to call their office for followup appointment.   Current medicines are reviewed at length with the patient today.    Labs/ tests ordered today include:  No orders of the defined types were placed in this encounter.     Disposition:   FU with  1 year.  Signed, Joni ReiningKathryn Elverna Caffee, NP  08/09/2015 2:43 PM    [facility] [address], [place], [place] [address] Phone: [phone]; Fax: [phone] In a

## 2015-08-09 NOTE — Progress Notes (Signed)
Name: Melissa Casey    DOB: [birth year]  Age: 50 y.o.  MR#: [id number]       PCP:  Maggie Font, MD      Insurance: Payor: Onnie Boer / Plan: [facility] OTHER / Product Type: *No Product type* /   CC:   No chief complaint on file.   VS Filed Vitals:   08/09/15 1422  Pulse: 89  Height: '5\' 8"'  (1.727 m)  Weight: 274 lb (124.286 kg)  SpO2: 93%    Weights Current Weight  08/09/15 274 lb (124.286 kg)  07/26/15 274 lb (124.286 kg)  06/10/15 274 lb 9.6 oz (124.558 kg)    Blood Pressure  BP Readings from Last 3 Encounters:  07/26/15 133/80  06/10/15 180/92  03/29/15 144/64     Admit date:  (Not on file) Last encounter with RMR:  Visit date not found   Allergy Review of patient's allergies indicates no known allergies.  Current Outpatient Prescriptions  Medication Sig Dispense Refill  . acetaminophen (TYLENOL) 500 MG tablet Take 1,000 mg by mouth every 6 (six) hours as needed.    Marland Kitchen aspirin EC 81 MG tablet Take 81 mg by mouth daily.    . benzonatate (TESSALON) 100 MG capsule Take 100 mg by mouth 3 (three) times daily as needed for cough.    Marland Kitchen omeprazole (PRILOSEC) 20 MG capsule 1 po every morning 30 minutes prior to your first meal. 90 capsule 3  . valsartan-hydrochlorothiazide (DIOVAN-HCT) 160-25 MG tablet Take 1 tablet by mouth daily.     No current facility-administered medications for this visit.    Discontinued Meds:    Medications Discontinued During This Encounter  Medication Reason  . sodium phosphate Pediatric (FLEET) 3.5-9.5 GM/59ML enema Error  . saxagliptin HCl (ONGLYZA) 2.5 MG TABS tablet Error    Patient Active Problem List   Diagnosis Date Noted  . Absolute anemia   . Anemia 06/10/2015  . Constipation 06/10/2015  . Rectal bleeding 06/10/2015  . Neck pain on left side 01/04/2015  . Chest pain 11/16/2010  . Shortness of breath 11/16/2010  . PVC's (premature ventricular contractions) 11/16/2010  . Essential hypertension, benign  11/16/2010  . Type II or unspecified type diabetes mellitus without mention of complication, uncontrolled 11/16/2010    LABS    Component Value Date/Time   NA 140 03/29/2015 1201   NA 138 06/08/2014 1438   NA 140 09/27/2013 1257   K 3.5 03/29/2015 1201   K 4.2 06/08/2014 1438   K 3.4* 09/27/2013 1257   CL 100* 03/29/2015 1201   CL 102 06/08/2014 1438   CL 102 09/27/2013 1257   CO2 28 03/29/2015 1201   CO2 27 06/08/2014 1438   CO2 25 09/27/2013 1257   GLUCOSE 137* 03/29/2015 1201   GLUCOSE 92 06/08/2014 1438   GLUCOSE 97 09/27/2013 1257   BUN 12 03/29/2015 1201   BUN 14 06/08/2014 1438   BUN 12 09/27/2013 1257   CREATININE 0.62 03/29/2015 1201   CREATININE 0.96 06/08/2014 1438   CREATININE 0.63 09/27/2013 1257   CREATININE 0.68 12/04/2009 1156   CALCIUM 9.1 03/29/2015 1201   CALCIUM 9.4 06/08/2014 1438   CALCIUM 8.9 09/27/2013 1257   GFRNONAA >60 03/29/2015 1201   GFRNONAA >90 09/27/2013 1257   GFRNONAA >60 12/04/2009 1156   GFRAA >60 03/29/2015 1201   GFRAA >90 09/27/2013 1257   GFRAA  12/04/2009 1156    >60        The eGFR has been calculated  using the MDRD equation. This calculation has not been validated in all clinical situations. eGFR's persistently <60 mL/min signify possible Chronic Kidney Disease.   CMP     Component Value Date/Time   NA 140 03/29/2015 1201   K 3.5 03/29/2015 1201   CL 100* 03/29/2015 1201   CO2 28 03/29/2015 1201   GLUCOSE 137* 03/29/2015 1201   BUN 12 03/29/2015 1201   CREATININE 0.62 03/29/2015 1201   CREATININE 0.96 06/08/2014 1438   CALCIUM 9.1 03/29/2015 1201   PROT 7.0 08/28/2006 0732   ALBUMIN 3.6 08/28/2006 0732   AST 19 08/28/2006 0732   ALT 16 08/28/2006 0732   ALKPHOS 61 08/28/2006 0732   BILITOT 0.3 08/28/2006 0732   GFRNONAA >60 03/29/2015 1201   GFRAA >60 03/29/2015 1201       Component Value Date/Time   WBC 3.3* 03/29/2015 1201   WBC 5.4 09/27/2013 1257   WBC 5.3 12/04/2009 1156   HGB 11.5* 03/29/2015  1201   HGB 10.6* 09/27/2013 1257   HGB 11.0* 12/04/2009 1156   HCT 37.3 03/29/2015 1201   HCT 33.0* 09/27/2013 1257   HCT 33.6* 12/04/2009 1156   MCV 78.4 03/29/2015 1201   MCV 76.0* 09/27/2013 1257   MCV 81.2 12/04/2009 1156    Lipid Panel  No results found for: CHOL, TRIG, HDL, CHOLHDL, VLDL, LDLCALC, LDLDIRECT  ABG No results found for: PHART, PCO2ART, PO2ART, HCO3, TCO2, ACIDBASEDEF, O2SAT   No results found for: TSH BNP (last 3 results) No results for input(s): BNP in the last 8760 hours.  ProBNP (last 3 results) No results for input(s): PROBNP in the last 8760 hours.  Cardiac Panel (last 3 results) No results for input(s): CKTOTAL, CKMB, TROPONINI, RELINDX in the last 72 hours.  Iron/TIBC/Ferritin/ %Sat    Component Value Date/Time   IRON 31* 06/10/2015 1112   TIBC 417 06/10/2015 1112   FERRITIN 24 06/10/2015 1112   IRONPCTSAT 7* 06/10/2015 1112     EKG Orders placed or performed during the hospital encounter of 03/29/15  . ED EKG  . ED EKG  . EKG 12-Lead  . EKG 12-Lead  . EKG     Prior Assessment and Plan Problem List as of 08/09/2015      Cardiovascular and Mediastinum   PVC's (premature ventricular contractions)   Last Assessment & Plan 11/16/2010 Office Visit Written 11/16/2010 11:44 AM by Satira Sark, MD    Reportedly noted during recent bicycle test in Dr. Cathey Endow office. Patient denies any dizziness or syncope, however has had a sense of palpitations in the last month. LVEF is not known.      Essential hypertension, benign   Last Assessment & Plan 04/10/2013 Office Visit Edited 04/10/2013  4:36 PM by Lendon Colonel, NP    He is requesting more samples for antihypertensive medications. We do not have any available. She is concerned about the cost of her current medication. I have advised her that we can give her a prescription for Benicar 40 mg/HCTZ 12.5 mg. She has [employer] and [employer] and this should help to cover the cost. It is not on  [employer] $4 plan. Would prefer that she be on an ARB.   Addendum:  The patient is upset at the cost of the medication prescribed, stating now that a pre-authorization should be completed and it is too expensive for her. She now be placed on lisinopril 10/12.5 mg daily.We will see her in a year and have her see  PCP on follow-up.          Digestive   Constipation   Last Assessment & Plan 06/10/2015 Office Visit Written 06/16/2015  1:30 PM by Orvil Feil, NP    Trial Linzess 145 mcg once daily. Samples provided.       Rectal bleeding   Last Assessment & Plan 06/10/2015 Office Visit Written 06/16/2015  1:30 PM by Orvil Feil, NP    50 year old very pleasant female with heme positive stool and intermittent low-volume hematochezia in the setting of constipation. No prior colonoscopy. Likely benign anorectal source but overdue for initial screening colonoscopy.   Proceed with colonoscopy with Dr. Oneida Alar in the near future. The risks, benefits, and alternatives have been discussed in detail with the patient. They state understanding and desire to proceed.  Trial Linzess 145 mcg once daily         Endocrine   Type II or unspecified type diabetes mellitus without mention of complication, uncontrolled   Last Assessment & Plan 11/16/2010 Office Visit Written 11/16/2010 11:46 AM by Satira Sark, MD    Recently diagnosed, followed by Dr. Berdine Addison.        Other   Chest pain   Last Assessment & Plan 04/15/2012 Office Visit Written 04/15/2012  2:18 PM by Lendon Colonel, NP    Stress echocardiogram completed revealed no echocardiographic evidence for stress-induced ischemia, EKG showed no diagnostic ST changes or inducible arrhythmias. The stress ECG was negative for ischemia. I explained this to her and she verbalizes understanding. She continues to be concerned about heartburn type symptoms along with feelings of food being stuck in her esophagus when she lies down. I started her on a  low-dose H2 blocker Pepcid 20 mg daily. She is to followup with her primary care physician for ongoing assessment and need to refer to GI at his discretion.      Shortness of breath   Last Assessment & Plan 04/10/2013 Office Visit Written 04/10/2013  4:29 PM by Lendon Colonel, NP    I advised her to followup with Dr. Berdine Addison, and ask about sleep apnea, and possible sleep study due to body habitus and ongoing hypertension.      Neck pain on left side   Anemia   Last Assessment & Plan 06/10/2015 Office Visit Written 06/16/2015  1:31 PM by Orvil Feil, NP    Chronic, denies heavy menses. Has noted some intermittent irregular vaginal bleeding. Check iron, ferritin, TIBC. If any evidence of IDA, would pursue EGD at time of colonoscopy. Discussed risks and benefits with stated understanding at time of visit.       Absolute anemia       Imaging: No results found.

## 2015-08-09 NOTE — Patient Instructions (Signed)

## 2015-08-10 ENCOUNTER — Telehealth: Payer: Self-pay | Admitting: Gastroenterology

## 2015-08-10 NOTE — Telephone Encounter (Signed)
Please call pt. She had HYPERPLASTIC POLYPS removed. HER stomach Bx shows mild gastritis. HER BORDERLINE LOW BLOOD COUNT IS MOST LIKELY DUE TO RECTA AND VAGINAL BLEEDING AS WELL AS GASTRITIS.   DRINK WATER TO KEEP YOUR URINE LIGHT YELLOW.  CONTINUE YOUR WEIGHT LOSS EFFORTS. LOSE TEN POUNDS.  FOLLOW A HIGH FIBER/LOW FAT DIET. AVOID ITEMS THAT CAUSE BLOATING.  START OMEPRAZOLE. TAKE 30 MINUTES PRIOR TO YOUR FIRST MEAL.  FOLLOW UP IN 4 MOS E30 Anemia. RECHECK CBC/FERRITIN ONE WEEK PRIOR TO YOUR NEXT VISIT.  Next colonoscopy in 10 years

## 2015-08-10 NOTE — Telephone Encounter (Signed)
OV made and reminder in epic °

## 2015-08-10 NOTE — Telephone Encounter (Signed)
Pt is aware of results. 

## 2015-08-10 NOTE — Telephone Encounter (Signed)
LMOM to call.

## 2015-08-12 ENCOUNTER — Other Ambulatory Visit: Payer: Self-pay

## 2015-08-12 DIAGNOSIS — D649 Anemia, unspecified: Secondary | ICD-10-CM

## 2015-10-20 ENCOUNTER — Other Ambulatory Visit: Payer: Self-pay

## 2015-10-20 DIAGNOSIS — D649 Anemia, unspecified: Secondary | ICD-10-CM

## 2015-11-24 ENCOUNTER — Ambulatory Visit: Payer: 59 | Admitting: Gastroenterology

## 2015-11-24 NOTE — Progress Notes (Deleted)
   Subjective:    Patient ID: Melissa Casey, female    DOB: 1965/11/03, 50 y.o.   MRN: [id number]  HPI    Review of Systems     Objective:   Physical Exam        Assessment & Plan:

## 2015-12-06 ENCOUNTER — Other Ambulatory Visit (HOSPITAL_COMMUNITY): Payer: Self-pay | Admitting: Family Medicine

## 2015-12-06 DIAGNOSIS — Z1231 Encounter for screening mammogram for malignant neoplasm of breast: Secondary | ICD-10-CM

## 2015-12-13 ENCOUNTER — Ambulatory Visit (HOSPITAL_COMMUNITY)
Admission: RE | Admit: 2015-12-13 | Discharge: 2015-12-13 | Disposition: A | Discharge status: Home / Self Care | Payer: 59 | Source: Ambulatory Visit | Attending: Family Medicine | Admitting: Family Medicine

## 2015-12-13 DIAGNOSIS — Z1231 Encounter for screening mammogram for malignant neoplasm of breast: Secondary | ICD-10-CM | POA: Insufficient documentation

## 2016-02-07 ENCOUNTER — Encounter: Payer: Self-pay | Admitting: Physician Assistant

## 2016-02-07 ENCOUNTER — Ambulatory Visit: Payer: 59 | Admitting: Physician Assistant

## 2016-03-13 ENCOUNTER — Other Ambulatory Visit (HOSPITAL_COMMUNITY)
Admission: RE | Admit: 2016-03-13 | Discharge: 2016-03-13 | Disposition: A | Discharge status: Home / Self Care | Payer: 59 | Source: Ambulatory Visit | Attending: Family Medicine | Admitting: Family Medicine

## 2016-03-13 ENCOUNTER — Other Ambulatory Visit: Payer: Self-pay | Admitting: Family Medicine

## 2016-03-13 DIAGNOSIS — Z113 Encounter for screening for infections with a predominantly sexual mode of transmission: Secondary | ICD-10-CM | POA: Insufficient documentation

## 2016-03-15 ENCOUNTER — Ambulatory Visit (HOSPITAL_COMMUNITY)
Admission: RE | Admit: 2016-03-15 | Discharge: 2016-03-15 | Disposition: A | Discharge status: Home / Self Care | Payer: 59 | Source: Ambulatory Visit | Attending: Family Medicine | Admitting: Family Medicine

## 2016-03-15 ENCOUNTER — Other Ambulatory Visit (HOSPITAL_COMMUNITY): Payer: Self-pay | Admitting: Family Medicine

## 2016-03-15 DIAGNOSIS — M79671 Pain in right foot: Secondary | ICD-10-CM | POA: Insufficient documentation

## 2016-03-15 DIAGNOSIS — M778 Other enthesopathies, not elsewhere classified: Secondary | ICD-10-CM | POA: Insufficient documentation

## 2016-03-17 LAB — URINE CYTOLOGY ANCILLARY ONLY
Bacterial vaginitis: NEGATIVE
CANDIDA VAGINITIS: NEGATIVE
Chlamydia: NEGATIVE
Neisseria Gonorrhea: NEGATIVE
TRICH (WINDOWPATH): NEGATIVE

## 2016-04-07 ENCOUNTER — Encounter (HOSPITAL_COMMUNITY): Payer: Self-pay

## 2016-04-07 ENCOUNTER — Emergency Department (HOSPITAL_COMMUNITY): Payer: 59

## 2016-04-07 ENCOUNTER — Emergency Department (HOSPITAL_COMMUNITY)
Admission: EM | Admit: 2016-04-07 | Discharge: 2016-04-07 | Disposition: A | Discharge status: Home / Self Care | Payer: 59 | Attending: Emergency Medicine | Admitting: Emergency Medicine

## 2016-04-07 DIAGNOSIS — Z7982 Long term (current) use of aspirin: Secondary | ICD-10-CM | POA: Insufficient documentation

## 2016-04-07 DIAGNOSIS — Z79899 Other long term (current) drug therapy: Secondary | ICD-10-CM | POA: Diagnosis not present

## 2016-04-07 DIAGNOSIS — I10 Essential (primary) hypertension: Secondary | ICD-10-CM | POA: Insufficient documentation

## 2016-04-07 DIAGNOSIS — E119 Type 2 diabetes mellitus without complications: Secondary | ICD-10-CM | POA: Diagnosis not present

## 2016-04-07 DIAGNOSIS — R05 Cough: Secondary | ICD-10-CM | POA: Diagnosis present

## 2016-04-07 DIAGNOSIS — J069 Acute upper respiratory infection, unspecified: Secondary | ICD-10-CM | POA: Diagnosis not present

## 2016-04-07 MED ORDER — HYDROCOD POLST-CPM POLST ER 10-8 MG/5ML PO SUER
5.0000 mL | Freq: Once | ORAL | Status: AC
  Administered 2016-04-07: 5 mL via ORAL
  Filled 2016-04-07: qty 5

## 2016-04-07 MED ORDER — OXYMETAZOLINE HCL 0.05 % NA SOLN
2.0000 | Freq: Once | NASAL | Status: AC
  Administered 2016-04-07: 2 via NASAL
  Filled 2016-04-07: qty 15

## 2016-04-07 MED ORDER — HYDROCODONE-HOMATROPINE 5-1.5 MG/5ML PO SYRP: 5.0000 mL | ORAL_SOLUTION | Freq: Four times a day (QID) | ORAL | 0 refills | Status: DC | PRN

## 2016-04-07 NOTE — ED Notes (Signed)
Flu like sx since weds- no flu shot followed by Dr Loleta ChanceHill

## 2016-04-07 NOTE — ED Provider Notes (Signed)
[facility] Provider Note   CSN: [id number] Arrival date & time: 04/07/16  1823     History   Chief Complaint Chief Complaint  Patient presents with  . Cough    HPI Melissa Casey is a 51 y.o. female.  The history is provided by the patient.  Cough  This is a new problem. The current episode started 2 days ago. The problem occurs hourly. The problem has been gradually worsening. The cough is productive of sputum. There has been no fever. Associated symptoms include chills, rhinorrhea, sore throat, myalgias and wheezing. She has tried cough syrup for the symptoms. The treatment provided no relief. She is not a smoker. Her past medical history does not include bronchitis, pneumonia, COPD or asthma.    Past Medical History:  Diagnosis Date  . DVT (deep venous thrombosis) (HCC)    remote past, over 20 years ago   . Essential hypertension, benign   . IBS (irritable bowel syndrome)   . Premature ventricular contractions (PVCs) (VPCs)    Per Dr. Loleta Chance  . Type 2 diabetes mellitus [facility])     Patient Active Problem List   Diagnosis Date Noted  . Absolute anemia   . Anemia 06/10/2015  . Constipation 06/10/2015  . Rectal bleeding 06/10/2015  . Neck pain on left side 01/04/2015  . Chest pain 11/16/2010  . Shortness of breath 11/16/2010  . PVC's (premature ventricular contractions) 11/16/2010  . Essential hypertension, benign 11/16/2010  . Type II or unspecified type diabetes mellitus without mention of complication, uncontrolled 11/16/2010    Past Surgical History:  Procedure Laterality Date  . COLONOSCOPY N/A 07/26/2015   Procedure: COLONOSCOPY;  Surgeon: West Bali, MD;  Location: [facility] ENDO SUITE;  Service: Endoscopy;  Laterality: N/A;  130 - moved to 6/26 @1 :30 - office to notify pt  . ESOPHAGOGASTRODUODENOSCOPY N/A 07/26/2015   Procedure: ESOPHAGOGASTRODUODENOSCOPY (EGD);  Surgeon: West Bali, MD;  Location: [facility] ENDO SUITE;  Service: Endoscopy;  Laterality:  N/A;  . None      OB History    No data available       Home Medications    Prior to Admission medications   Medication Sig Start Date End Date Taking? Authorizing Provider  acetaminophen (TYLENOL) 500 MG tablet Take 1,000 mg by mouth every 6 (six) hours as needed.    Historical Provider, MD  aspirin EC 81 MG tablet Take 81 mg by mouth daily.    Historical Provider, MD  benzonatate (TESSALON) 100 MG capsule Take 100 mg by mouth 3 (three) times daily as needed for cough.    Historical Provider, MD  omeprazole (PRILOSEC) 20 MG capsule 1 po every morning 30 minutes prior to your first meal. 07/26/15   West Bali, MD  valsartan-hydrochlorothiazide (DIOVAN-HCT) 160-25 MG tablet Take 1 tablet by mouth daily.    Historical Provider, MD    Family History Family History  Problem Relation Age of Onset  . Coronary artery disease Mother   . Coronary artery disease Father     Died age 43  . Hypertension Brother   . Arrhythmia Sister   . Colon cancer Neg Hx     Social History Social History  Substance Use Topics  . Smoking status: Never Smoker  . Smokeless tobacco: Never Used  . Alcohol use No     Allergies   Patient has no known allergies.   Review of Systems Review of Systems  Constitutional: Positive for chills. Negative for fever.  HENT:  Positive for congestion, postnasal drip, rhinorrhea, sinus pressure and sore throat.   Respiratory: Positive for cough and wheezing.   Musculoskeletal: Positive for myalgias.  All other systems reviewed and are negative.    Physical Exam Updated Vital Signs BP 181/87 (BP Location: Left Arm)   Pulse 95   Temp 98.8 F (37.1 C) (Oral)   Resp 20   Ht 5\' 9"  (1.753 m)   Wt 121.1 kg   SpO2 99%   BMI 39.43 kg/m   Physical Exam  Constitutional: She is oriented to person, place, and time. She appears well-developed and well-nourished.  Non-toxic appearance.  HENT:  Head: Normocephalic.  Right Ear: Tympanic membrane and external  ear normal.  Left Ear: Tympanic membrane and external ear normal.  The uvula is enlarged. There is no exudate appreciated in the posterior pharynx. The airway is patent. There is nasal congestion present.  Eyes: EOM and lids are normal. Pupils are equal, round, and reactive to light.  Neck: Normal range of motion. Neck supple. Carotid bruit is not present.  Cardiovascular: Normal rate, regular rhythm, normal heart sounds, intact distal pulses and normal pulses.   Pulmonary/Chest: Breath sounds normal. No respiratory distress.  Abdominal: Soft. Bowel sounds are normal. There is no tenderness. There is no guarding.  Musculoskeletal: Normal range of motion. She exhibits no edema.  Lymphadenopathy:       Head (right side): No submandibular adenopathy present.       Head (left side): No submandibular adenopathy present.    She has no cervical adenopathy.  Neurological: She is alert and oriented to person, place, and time. She has normal strength. No cranial nerve deficit or sensory deficit.  Skin: Skin is warm and dry. No rash noted.  Psychiatric: She has a normal mood and affect. Her speech is normal.  Nursing note and vitals reviewed.    ED Treatments / Results  Labs (all labs ordered are listed, but only abnormal results are displayed) Labs Reviewed - No data to display  EKG  EKG Interpretation None       Radiology No results found.  Procedures Procedures (including critical care time)  Medications Ordered in ED Medications - No data to display   Initial Impression / Assessment and Plan / ED Course  I have reviewed the triage vital signs and the nursing notes.  Pertinent labs & imaging results that were available during my care of the patient were reviewed by me and considered in my medical decision making (see chart for details).     *I have reviewed nursing notes, vital signs, and all appropriate lab and imaging results for this patient.*  Final Clinical  Impressions(s) / ED Diagnoses MDM Patient is a 51 year old female with 2-3 days of flulike symptoms on. He particularly nasal congestion and cough, and some body aches. The chest x-ray is negative for acute problem. The pulse oximetry is 99-100% on room air. The blood pressure is elevated. I discussed the findings of the examination as well as the findings of the x-ray with the patient in terms which he understands. The plan at this time is for the patient to increase fluids. Wash hands frequently. Use a mask until symptoms have resolved. She will use Afrin spray over the next 4 days. She will be given medication for cough. I've asked her to follow-up with Dr. Loleta ChanceHill for follow-up and recheck. Patient will return to the emergency department if any emergent changes, problems, or concerns.    Final diagnoses:  None  New Prescriptions New Prescriptions   No medications on file     Ivery Quale, Cordelia Poche 04/07/16 2011    Eber Hong, MD 04/08/16 [phone]

## 2016-04-07 NOTE — ED Notes (Signed)
From radiology 

## 2016-04-07 NOTE — ED Triage Notes (Signed)
Productive cough/nasal congestion x2 days. Denies fever.

## 2016-04-07 NOTE — Discharge Instructions (Signed)
Please wash hands frequently. Please use your mask until symptoms have resolved. Please increase fluids. Use Afrin every 12 hours for the next 4 days. After 4 days, hold the Afrin for additional 4 days and then you may resume the Afrin for an additional 4 days if needed. Use Hycodan for cough. This medication may cause drowsiness, please do not drive, operate machinery, or dissipated activities requiring concentration when taking this medication. Please see Dr.Hill, or return to the emergency department for additional evaluation if not improving.

## 2016-04-12 ENCOUNTER — Encounter (HOSPITAL_COMMUNITY): Payer: Self-pay | Admitting: Emergency Medicine

## 2016-04-12 ENCOUNTER — Emergency Department (HOSPITAL_COMMUNITY)
Admission: EM | Admit: 2016-04-12 | Discharge: 2016-04-12 | Disposition: A | Discharge status: Home / Self Care | Payer: 59 | Attending: Emergency Medicine | Admitting: Emergency Medicine

## 2016-04-12 DIAGNOSIS — J069 Acute upper respiratory infection, unspecified: Secondary | ICD-10-CM | POA: Diagnosis not present

## 2016-04-12 DIAGNOSIS — E119 Type 2 diabetes mellitus without complications: Secondary | ICD-10-CM | POA: Insufficient documentation

## 2016-04-12 DIAGNOSIS — R69 Illness, unspecified: Secondary | ICD-10-CM

## 2016-04-12 DIAGNOSIS — B9789 Other viral agents as the cause of diseases classified elsewhere: Secondary | ICD-10-CM

## 2016-04-12 DIAGNOSIS — I10 Essential (primary) hypertension: Secondary | ICD-10-CM | POA: Diagnosis not present

## 2016-04-12 DIAGNOSIS — J111 Influenza due to unidentified influenza virus with other respiratory manifestations: Secondary | ICD-10-CM

## 2016-04-12 DIAGNOSIS — R05 Cough: Secondary | ICD-10-CM | POA: Diagnosis present

## 2016-04-12 DIAGNOSIS — Z7982 Long term (current) use of aspirin: Secondary | ICD-10-CM | POA: Insufficient documentation

## 2016-04-12 MED ORDER — ALBUTEROL SULFATE HFA 108 (90 BASE) MCG/ACT IN AERS
2.0000 | INHALATION_SPRAY | Freq: Four times a day (QID) | RESPIRATORY_TRACT | Status: DC | PRN
  Administered 2016-04-12: 2 via RESPIRATORY_TRACT
  Filled 2016-04-12: qty 6.7

## 2016-04-12 NOTE — ED Triage Notes (Signed)
Cough, congestion, body aches since last wed. Was seen here and d/c last Friday

## 2016-04-12 NOTE — ED Notes (Signed)
Pt made aware to return if symptoms worsen or if any life threatening symptoms occur.   

## 2016-04-12 NOTE — Discharge Instructions (Signed)
You have a flu like illness. Get rest, drink plenty of fluids. Continue with your cough medication and nose spray. You may need a full 1-2 weeks to get better.  Please return without fail for worsening symptoms, including fever, difficulty breathing, passing out, confusion or any other symptoms concerning to you.

## 2016-04-12 NOTE — ED Provider Notes (Signed)
[facility]-EMER[facility]ENCY DEPT Provider Note   CSN: [id number] Arrival date & time: 04/12/16  1646     History   Chief Complaint Chief Complaint  Patient presents with  . Cough    HPI Melissa Casey is a 51 y.o. female.  The history is provided by the patient.  Cough  This is a new problem. The current episode started more than 2 days ago. The problem occurs constantly. The problem has not changed since onset.The cough is productive of sputum. There has been no fever. Associated symptoms include chills, headaches, rhinorrhea, sore throat, myalgias and wheezing. Pertinent negatives include no shortness of breath. Chest pain: chest wall pain with cough. She has tried cough syrup and decongestants for the symptoms. The treatment provided no relief.    Past Medical History:  Diagnosis Date  . DVT (deep venous thrombosis) (HCC)    remote past, over 20 years ago   . Essential hypertension, benign   . IBS (irritable bowel syndrome)   . Premature ventricular contractions (PVCs) (VPCs)    Per Dr. Loleta ChanceHill  . Type 2 diabetes mellitus [facility](HCC)     Patient Active Problem List   Diagnosis Date Noted  . Absolute anemia   . Anemia 06/10/2015  . Constipation 06/10/2015  . Rectal bleeding 06/10/2015  . Neck pain on left side 01/04/2015  . Chest pain 11/16/2010  . Shortness of breath 11/16/2010  . PVC's (premature ventricular contractions) 11/16/2010  . Essential hypertension, benign 11/16/2010  . Type II or unspecified type diabetes mellitus without mention of complication, uncontrolled 11/16/2010    Past Surgical History:  Procedure Laterality Date  . COLONOSCOPY N/A 07/26/2015   Procedure: COLONOSCOPY;  Surgeon: West BaliSandi L Fields, MD;  Location: [facility] ENDO SU[facility];  Service: Endoscopy;  Laterality: N/A;  130 - moved to 6/26 @1 :30 - office to notify pt  . ESOPHAGOGASTRODUODENOSCOPY N/A 07/26/2015   Procedure: ESOPHAGOGASTRODUODENOSCOPY (EGD);  Surgeon: West BaliSandi L Fields, MD;  Location: [facility];   Service: Endoscopy;  Laterality: N/A;  . None      OB History    No data available       Home Medications    Prior to Admission medications   Medication Sig Start Date End Date Taking? Authorizing Provider  acetaminophen (TYLENOL) 500 MG tablet Take 1,000 mg by mouth every 6 (six) hours as needed.    Historical Provider, MD  aspirin EC 81 MG tablet Take 81 mg by mouth daily.    Historical Provider, MD  benzonatate (TESSALON) 100 MG capsule Take 100 mg by mouth 3 (three) times daily as needed for cough.    Historical Provider, MD  HYDROcodone-homatropine (HYCODAN) 5-1.5 MG/5ML syrup Take 5 mLs by mouth every 6 (six) hours as needed. 04/07/16   Ivery QualeHobson Bryant, PA-C  omeprazole (PRILOSEC) 20 MG capsule 1 po every morning 30 minutes prior to your first meal. 07/26/15   West BaliSandi L Fields, MD  valsartan-hydrochlorothiazide (DIOVAN-HCT) 160-25 MG tablet Take 1 tablet by mouth daily.    Historical Provider, MD    Family History Family History  Problem Relation Age of Onset  . Coronary artery disease Mother   . Coronary artery disease Father     Died age 51  . Hypertension Brother   . Arrhythmia Sister   . Colon cancer Neg Hx     Social History Social History  Substance Use Topics  . Smoking status: Never Smoker  . Smokeless tobacco: Never Used  . Alcohol use No     Allergies  Patient has no known allergies.   Review of Systems Review of Systems  Constitutional: Positive for chills.  HENT: Positive for rhinorrhea and sore throat.   Respiratory: Positive for cough and wheezing. Negative for shortness of breath.   Cardiovascular: Chest pain: chest wall pain with cough.  Gastrointestinal: Negative for abdominal pain and vomiting.  Genitourinary: Negative for difficulty urinating.  Musculoskeletal: Positive for myalgias.  Neurological: Positive for headaches.  Hematological: Does not bruise/bleed easily.  Psychiatric/Behavioral: Negative for confusion.  All other systems  reviewed and are negative.    Physical Exam Updated Vital Signs BP 145/61   Pulse 93   Temp 97.8 F (36.6 C)   Resp 20   Ht 5\' 9"  (1.753 m)   Wt 265 lb (120.2 kg)   SpO2 100%   BMI 39.13 kg/m   Physical Exam Physical Exam  Nursing note and vitals reviewed. Constitutional: Well developed, well nourished, non-toxic, and in no acute distress Head: Normocephalic and atraumatic.  Mouth/Throat: Oropharynx is clear and mucous membranes dry.  Neck: Normal range of motion. Neck supple.  Cardiovascular: Normal rate and regular rhythm.   Pulmonary/Chest: Effort normal and breath sounds normal.  Abdominal: Soft. There is no tenderness. There is no rebound and no guarding.  Musculoskeletal: Normal range of motion.  Neurological: Alert, no facial droop, fluent speech, moves all extremities symmetrically Skin: Skin is warm and dry.  Psychiatric: Cooperative   ED Treatments / Results  Labs (all labs ordered are listed, but only abnormal results are displayed) Labs Reviewed - No data to display  EKG  EKG Interpretation None       Radiology No results found.  Procedures Procedures (including critical care time)  Medications Ordered in ED Medications  albuterol (PROVENTIL HFA;VENTOLIN HFA) 108 (90 Base) MCG/ACT inhaler 2 puff (not administered)     Initial Impression / Assessment and Plan / ED Course  I have reviewed the triage vital signs and the nursing notes.  Pertinent labs & imaging results that were available during my care of the patient were reviewed by me and considered in my medical decision making (see chart for details).     Persistent cough, congestion, body aches. Records reviewed. Seen in ED 3-4 days ago with negative CXR. With persistent symptoms. Non-toxic and in no acute distress. Vitals within normal limits. Exam overall non-concerning and c/w persistent viral illness, likely influenza like illness. Discussed continued supportive care. Strict return and  follow-up instructions reviewed. She expressed understanding of all discharge instructions and felt comfortable with the plan of care.   Final Clinical Impressions(s) / ED Diagnoses   Final diagnoses:  Viral URI with cough  Influenza-like illness    New Prescriptions New Prescriptions   No medications on file     Lavera Guise, MD 04/12/16 1735

## 2016-08-08 ENCOUNTER — Ambulatory Visit (INDEPENDENT_AMBULATORY_CARE_PROVIDER_SITE_OTHER): Payer: 59 | Admitting: Adult Health

## 2016-08-08 ENCOUNTER — Encounter: Payer: Self-pay | Admitting: Adult Health

## 2016-08-08 VITALS — BP 138/88 | HR 83 | Ht 69.0 in | Wt 271.0 lb

## 2016-08-08 DIAGNOSIS — I10 Essential (primary) hypertension: Secondary | ICD-10-CM | POA: Diagnosis not present

## 2016-08-08 DIAGNOSIS — K591 Functional diarrhea: Secondary | ICD-10-CM | POA: Diagnosis not present

## 2016-08-08 DIAGNOSIS — I493 Ventricular premature depolarization: Secondary | ICD-10-CM

## 2016-08-08 NOTE — Patient Instructions (Addendum)
Your physician wants you to follow-up in: 6 Months with Dr. Wyline MoodBranch. You will receive a reminder letter in the mail two months in advance. If you don't receive a letter, please call our office to schedule the follow-up appointment.   Your physician recommends that you continue on your current medications as directed. Please refer to the Current Medication list given to you today.  Your physician recommends that you return for lab work in: Today   If you need a refill on your cardiac medications before your next appointment, please call your pharmacy.  Thank you for choosing [facility]!

## 2016-08-08 NOTE — Progress Notes (Signed)
Cardiology Office Note   Date:  08/08/2016   ID:  Melissa Casey, DOB 06-Nov-1965, MRN [id number]  PCP:  Mirna MiresHill, Gerald, MD  [facility]   Chief Complaint  Patient presents with  . Chest Pain  . Hypertension     History of Present Illness: Melissa LanaCassandra N Stern is a 51 y.o. female who presents for ongoing assessment and management of palpitations, hypertension, with history of atypical chest pain, diabetes, IBS, and history of DVT. The patient was last seen in the office in April 2017 with no cardiac complaints at that time. No testing or medication changes were made.  She is here today without any cardiac complaints. She continues to have issues with gastro-enteritis, frequent diarrhea. She states every time she eats something he runs right through. She denies any chest pain, she is medically compliant.  Past Medical History:  Diagnosis Date  . DVT (deep venous thrombosis) (HCC)    remote past, over 20 years ago   . Essential hypertension, benign   . IBS (irritable bowel syndrome)   . Premature ventricular contractions (PVCs) (VPCs)    Per Dr. Loleta ChanceHill  . Type 2 diabetes mellitus (HCC)     Past Surgical History:  Procedure Laterality Date  . COLONOSCOPY N/A 07/26/2015   Procedure: COLONOSCOPY;  Surgeon: West BaliSandi L Fields, MD;  Location[facility];  Service: Endoscopy;  Laterality: N/A;  130 - moved to 6/26 @1 :30 - office to notify pt  . ESOPHAGOGASTRODUODENOSCOPY N/A 07/26/2015   Procedure: ESOPHAGOGASTRODUODENOSCOPY (EGD);  Surgeon: West BaliSandi L Fields, MD;  Location: [facility];  Service: Endoscopy;  Laterality: N/A;  . None       Current Outpatient Prescriptions  Medication Sig Dispense Refill  . acetaminophen (TYLENOL) 500 MG tablet Take 1,000 mg by mouth every 6 (six) hours as needed.    Marland Kitchen. amLODipine (NORVASC) 5 MG tablet     . aspirin EC 81 MG tablet Take 81 mg by mouth daily.    Marland Kitchen. dicyclomine (BENTYL) 10 MG capsule     . valsartan-hydrochlorothiazide  (DIOVAN-HCT) 160-25 MG tablet Take 1 tablet by mouth daily.     No current facility-administered medications for this visit.     Allergies:   Patient has no known allergies.    Social History:  The patient  reports that she has never smoked. She has never used smokeless tobacco. She reports that she does not drink alcohol or use drugs.   Family History:  The patient's family history includes Arrhythmia in her sister; Coronary artery disease in her father and mother; Hypertension in her brother.    ROS: All other systems are reviewed and negative. Unless otherwise mentioned in H&P    PHYSICAL EXAM: VS:  BP 138/88   Pulse 83   Ht 5\' 9"  (1.753 m)   Wt 271 lb (122.9 kg)   SpO2 98%   BMI 40.02 kg/m  , BMI Body mass index is 40.02 kg/m. GEN: Well nourished, well developed, in no acute distress Obese. HEENT: normal  Neck: no JVD, carotid bruits, or masses Cardiac: RRR; no murmurs, rubs, or gallops,no edema  Respiratory:  clear to auscultation bilaterally, normal work of breathing GI: soft, nontender, nondistended, + BS MS: no deformity or atrophy  Skin: warm and dry, no rash Neuro:  Strength and sensation are intact Psych: euthymic mood, full affect   EKG: The ekg ordered today demonstrates NSR with non-specific T-wave abnormalities.    Recent Labs: No results found for requested labs within last  8760 hours.    Lipid Panel No results found for: CHOL, TRIG, HDL, CHOLHDL, VLDL, LDLCALC, LDLDIRECT    Wt Readings from Last 3 Encounters:  08/08/16 271 lb (122.9 kg)  04/12/16 265 lb (120.2 kg)  04/07/16 267 lb (121.1 kg)      Other studies Reviewed: Stress Echo 11/18/2010 Study Conclusions  - Stress ECG conclusions: The stress ECG was negative for ischemia. - Staged echo: There was no echocardiographic evidence for stress-induced ischemia. Bruce protocol. Stress echocardiography.    ASSESSMENT AND PLAN:  1. Hypertension:  Blood pressure is currently  well-controlled. Will not make any changes in her medication regimen at this time. With her frequent diarrhea and also being on HCTZ, I am going to check a BMET and magnesium to evaluate for evidence of dehydration.  2. Frequent diarrhea: The patient has a follow-up of point with Dr. Fransisco Hertz in September. However, the patient is having worsening symptoms. Staining is time she eats something it "goes right through me" with dark runny stool. The patient does drink a lot of [facility]. I advised her to stop doing so as this can also increase peristalsis. She is to hold off on [facility] for now. I will give [facility] a call to see if she can be seen sooner.   Current medicines are reviewed at length with the patient today.    Labs/ tests ordered today include: BMET, magnesium.  Bettey Mare. Liborio Nixon, ANP, AACC   08/08/2016 5:03 PM    [facility] [address], [place], [place] [address] Phone: [phone]; Fax: [phone]

## 2016-08-09 ENCOUNTER — Other Ambulatory Visit: Payer: Self-pay

## 2016-08-09 ENCOUNTER — Telehealth: Payer: Self-pay

## 2016-08-09 DIAGNOSIS — R197 Diarrhea, unspecified: Secondary | ICD-10-CM

## 2016-08-09 NOTE — Telephone Encounter (Signed)
PT returned call and said she has been having problems for the last 4 weeks. Everything she eats goes straight through her, watery and sometimes it is BLACK and sometimes ORANGE.  She does not have nausea/vomiting except on occasions she has a "sweating spell" and then she gets nauseated. Her appt is not until Sept. Forwarding to Tana CoastLeslie Lewis, PA for advise!

## 2016-08-09 NOTE — Telephone Encounter (Signed)
Vm from Melissa Casey, [place] that pt is having GI problems and needs earlier appt than 10/04/2016 appt.  I called pt at [phone] and [facility] return call to discuss her problems.

## 2016-08-09 NOTE — Telephone Encounter (Signed)
Let's have her get CBC done. Stool for Cdiff PCR and GI pathogen panel.   May use urgent ov if available.

## 2016-08-09 NOTE — Telephone Encounter (Signed)
PT called and is aware. [facility] so she will just go there and have CBC done and pick up the containers for the stool studies from them.

## 2016-08-09 NOTE — Telephone Encounter (Signed)
Lab orders have been entered. ( Remind pt the GI pathogen will need to be frozen). She may pick up containers here or at the lab.

## 2016-08-09 NOTE — Telephone Encounter (Signed)
LMOM to call.

## 2016-08-10 ENCOUNTER — Telehealth: Payer: Self-pay | Admitting: *Deleted

## 2016-08-10 LAB — CBC WITH DIFFERENTIAL/PLATELET
Basophils Absolute: 65 cells/uL (ref 0–200)
Basophils Relative: 1 %
EOS PCT: 2 %
Eosinophils Absolute: 130 cells/uL (ref 15–500)
HCT: 37.3 % (ref 35.0–45.0)
HEMOGLOBIN: 11.5 g/dL — AB (ref 11.7–15.5)
LYMPHS ABS: 2795 {cells}/uL (ref 850–3900)
Lymphocytes Relative: 43 %
MCH: 25.3 pg — AB (ref 27.0–33.0)
MCHC: 30.8 g/dL — AB (ref 32.0–36.0)
MCV: 82.2 fL (ref 80.0–100.0)
MPV: 10 fL (ref 7.5–12.5)
Monocytes Absolute: 390 cells/uL (ref 200–950)
Monocytes Relative: 6 %
NEUTROS ABS: 3120 {cells}/uL (ref 1500–7800)
Neutrophils Relative %: 48 %
Platelets: 299 10*3/uL (ref 140–400)
RBC: 4.54 MIL/uL (ref 3.80–5.10)
RDW: 16.3 % — ABNORMAL HIGH (ref 11.0–15.0)
WBC: 6.5 10*3/uL (ref 3.8–10.8)

## 2016-08-10 LAB — BASIC METABOLIC PANEL
BUN: 12 mg/dL (ref 7–25)
CHLORIDE: 103 mmol/L (ref 98–110)
CO2: 24 mmol/L (ref 20–31)
Calcium: 9.7 mg/dL (ref 8.6–10.4)
Creat: 0.77 mg/dL (ref 0.50–1.05)
Glucose, Bld: 133 mg/dL — ABNORMAL HIGH (ref 65–99)
Potassium: 3.6 mmol/L (ref 3.5–5.3)
SODIUM: 141 mmol/L (ref 135–146)

## 2016-08-10 LAB — MAGNESIUM: MAGNESIUM: 1.7 mg/dL (ref 1.5–2.5)

## 2016-08-10 MED ORDER — MAGNESIUM OXIDE -MG SUPPLEMENT 200 MG PO TABS: 200.0000 mg | ORAL_TABLET | Freq: Every day | ORAL | 6 refills | Status: DC

## 2016-08-10 NOTE — Telephone Encounter (Signed)
Called patient with test results. No answer. Left message to call back.  

## 2016-08-10 NOTE — Telephone Encounter (Signed)
-----   Message from Jodelle GrossKathryn M Lawrence, NP sent at 08/10/2016  7:24 AM EDT ----- Potassium and magnesium low normal Please add magnesium 200 mg daily to her regimen.  Thank you.

## 2016-08-10 NOTE — Telephone Encounter (Signed)
-----   Message from Kathryn M Lawrence, NP sent at 08/10/2016  7:24 AM EDT ----- Potassium and magnesium low normal Please add magnesium 200 mg daily to her regimen.  Thank you. 

## 2016-08-11 LAB — CLOSTRIDIUM DIFFICILE BY PCR: CDIFFPCR: NOT DETECTED

## 2016-08-16 NOTE — Progress Notes (Signed)
Hgb slightly low but stable, Hct normal.  Cdiff pcr negative.  Gi pathogen panel ?not done? Looks like last telephone note note routed to move up her appt.   Let's see if she completed the gi pathogen panel and try to move up her appt.

## 2016-08-17 ENCOUNTER — Other Ambulatory Visit: Payer: Self-pay

## 2016-08-17 LAB — GASTROINTESTINAL PATHOGEN PANEL PCR
C. DIFFICILE TOX A/B, PCR: NOT DETECTED
CAMPYLOBACTER, PCR: NOT DETECTED
Cryptosporidium, PCR: NOT DETECTED
E COLI 0157, PCR: NOT DETECTED
E coli (ETEC) LT/ST PCR: NOT DETECTED
E coli (STEC) stx1/stx2, PCR: NOT DETECTED
GIARDIA LAMBLIA, PCR: NOT DETECTED
Norovirus, PCR: NOT DETECTED
ROTAVIRUS, PCR: NOT DETECTED
SALMONELLA, PCR: NOT DETECTED
SHIGELLA, PCR: NOT DETECTED

## 2016-08-17 NOTE — Progress Notes (Signed)
Gi path panel finally resulted. Negative.

## 2016-08-17 NOTE — Progress Notes (Signed)
LMOM for a return call.  

## 2016-09-21 ENCOUNTER — Ambulatory Visit (INDEPENDENT_AMBULATORY_CARE_PROVIDER_SITE_OTHER): Payer: 59 | Admitting: Gastroenterology

## 2016-09-21 DIAGNOSIS — R197 Diarrhea, unspecified: Secondary | ICD-10-CM

## 2016-09-21 NOTE — Patient Instructions (Addendum)
DRINK WATER TO KEEP YOUR URINE LIGHT YELLOW.  SUBMIT STOOL SAMPLE.  AVOID DAIRY FOR ONE MONTH.  SEE INFO BELOW.   IF YOU MUST CONSUME DAIRY, ADD LACTASE 3 PILLS WITH MEALS UP TO THREE OR FOUR TIMES A DAY.  WHEN YOU AVOID DAIRY, ONLY USE DICYCLOMINE IF NEEDED TO AVOID DIARRHEA/ABDOMINAL PAIN.  PLEASE CALL IN ONE MONTH IF SYMPTOMS ARE NOT IMPROVED.   FOLLOW UP IN 3 MOS.    Lactose Free Diet Lactose is a carbohydrate that is found mainly in milk and milk products, as well as in foods with added milk or whey. Lactose must be digested by the enzyme in order to be used by the body. Lactose intolerance occurs when there is a shortage of lactase.When your body is not able to digest lactose, you may feel sick to your stomach (nausea), bloating, cramping, gas and diarrhea.  There are many dairy products that may be tolerated better than milk by some people:  The use of cultured dairy products such as yogurt, buttermilk, cottage cheese, and sweet acidophilus milk (Kefir) for lactase-deficient individuals is usually well tolerated. This is because the healthy bacteria help digest lactose.   Lactose-hydrolyzed milk (Lactaid) contains 40-90% less lactose than milk and may also be well tolerated.    SPECIAL NOTES  Lactose is a carbohydrates. The major food source is dairy products. Reading food labels is important. Many products contain lactose even when they are not made from milk. Look for the following words: whey, milk solids, dry milk solids, nonfat dry milk powder. Typical sources of lactose other than dairy products include breads, candies, cold cuts, prepared and processed foods, and commercial sauces and gravies.   All foods must be prepared without milk, cream, or other dairy foods.   Soy milk and lactose-free supplements (LACTASE) may be used as an alternative to milk.   FOOD GROUP ALLOWED/RECOMMENDED AVOID/USE SPARINGLY  BREADS / STARCHES 4 servings or more* Breads and rolls made  without milk. [place], [place], or [place] bread. Breads and rolls that contain milk. Prepared mixes such as muffins, biscuits, waffles, pancakes. Sweet rolls, donuts, [place] toast (if made with milk or lactose).  Crackers: Soda crackers, graham crackers. Any crackers prepared without lactose. Zwieback crackers, corn curls, or any that contain lactose.  Cereals: Cooked or dry cereals prepared without lactose (read labels). Cooked or dry cereals prepared with lactose (read labels). Total, Cocoa Krispies. Special K.  Potatoes / Pasta / Rice: Any prepared without milk or lactose. Popcorn. Instant potatoes, frozen [place] fries, scalloped or au gratin potatoes.  VEGETABLES 2 servings or more Fresh, frozen, and canned vegetables. Creamed or breaded vegetables. Vegetables in a cheese sauce or with lactose-containing margarines.  FRUIT 2 servings or more All fresh, canned, or frozen fruits that are not processed with lactose. Any canned or frozen fruits processed with lactose.  MEAT & SUBSTITUTES 2 servings or more (4 to 6 oz. total per day) Plain beef, chicken, fish, [place], lamb, veal, pork, or ham. Kosher prepared meat products. Strained or junior meats that do not contain milk. Eggs, soy meat substitutes, nuts. Scrambled eggs, omelets, and souffles that contain milk. Creamed or breaded meat, fish, or fowl. Sausage products such as wieners, liver sausage, or cold cuts that contain milk solids. Cheese, cottage cheese, or cheese spreads.  MILK None. (See "BEVERAGES" for milk substitutes. See "DESSERTS" for ice cream and frozen desserts.) Milk (whole, 2%, skim, or chocolate). Evaporated, powdered, or condensed milk; malted milk.  SOUPS & COMBINATION FOODS  Bouillon, broth, vegetable soups, clear soups, consomms. Homemade soups made with allowed ingredients. Combination or prepared foods that do not contain milk or milk products (read labels). Cream soups, chowders, commercially prepared soups containing  lactose. Macaroni and cheese, pizza. Combination or prepared foods that contain milk or milk products.  DESSERTS & SWEETS In moderation Water and fruit ices; gelatin; angel food cake. Homemade cookies, pies, or cakes made from allowed ingredients. Pudding (if made with water or a milk substitute). Lactose-free tofu desserts. Sugar, honey, corn syrup, jam, jelly; marmalade; molasses (beet sugar); Pure sugar candy; marshmallows. Ice cream, ice milk, sherbet, custard, pudding, frozen yogurt. Commercial cake and cookie mixes. Desserts that contain chocolate. Pie crust made with milk-containing margarine; reduced-calorie desserts made with a sugar substitute that contains lactose. Toffee, peppermint, butterscotch, chocolate, caramels.  FATS & OILS In moderation Butter (as tolerated; contains very small amounts of lactose). Margarines and dressings that do not contain milk, Vegetable oils, shortening, Miracle Whip, nondairy cream & whipped toppings without lactose or milk solids added (examples: Coffee Rich, Carnation Coffeemate, Rich's Whipped Topping, PolyRich). Tomasa Blase. Margarines and salad dressings containing milk; cream, cream cheese; peanut butter with added milk solids, sour cream, chip dips, made with sour cream.  BEVERAGES Carbonated drinks; tea; coffee and freeze-dried coffee; some instant coffees (check labels). Fruit drinks; fruit and vegetable juice; Rice or Soy milk. Ovaltine, hot chocolate. Some cocoas; some instant coffees; instant iced teas; powdered fruit drinks (read labels).   CONDIMENTS / MISCELLANEOUS Soy sauce, carob powder, olives, gravy made with water, baker's cocoa, pickles, pure seasonings and spices, wine, pure monosodium glutamate, catsup, mustard. Some chewing gums, chocolate, some cocoas. Certain antibiotics and vitamin / mineral preparations. Spice blends if they contain milk products. MSG extender. Artificial sweeteners that contain lactose such as Equal (Nutra-Sweet) and  Sweet 'n Low. Some nondairy creamers (read labels).   SAMPLE MENU*  Breakfast   Orange Juice.  Banana.   Bran flakes.   Nondairy Creamer.  Vienna Bread (toasted).   Butter or milk-free margarine.   Coffee or tea.    Noon Meal   Chicken Breast.  Rice.   Green beans.   Butter or milk-free margarine.  Fresh melon.   Coffee or tea.    Evening Meal   Roast Beef.  Baked potato.   Butter or milk-free margarine.   Broccoli.   Lettuce salad with vinegar and oil dressing.  [employer].   Coffee or tea.

## 2016-09-21 NOTE — Assessment & Plan Note (Signed)
INTERMITTENT SYMPTOMS NOT IDEALLY CONTROLLED ON BENTYL BID. DIFFERENTIAL DIAGNOSIS INCLUDES: LACTOSE INTOLERNACE, IBS-MIXED, MED SIDE EFFECT(JANUVIA,LESS LIKELY MGOX), MICROSCOPIC COLITIS,  LESS LIKELY CELIAC SPRUE, OR GIARDIASIS.  DRINK WATER TO KEEP YOUR URINE LIGHT YELLOW. SUBMIT STOOL SAMPLE-GIARDIA. AVOID DAIRY FOR ONE MONTH.   HANDOUT GIVEN. IF YOU MUST CONSUME DAIRY, ADD LACTASE 3 PILLS WITH MEALS UP TO THREE OR FOUR TIMES A DAY. WHEN YOU AVOID DAITY, ONLY USE DICYCLOMINE IF NEEDED TO AVOID DIARRHEA/ABDOMINAL PAIN. CALL IN ONE MONTH IF SYMPTOMS ARE NOT IMPROVED.   FOLLOW UP IN 3 MOS.

## 2016-09-21 NOTE — Progress Notes (Signed)
   Subjective:    Patient ID: Melissa Casey, female    DOB: November 23, 1965, 51 y.o.   MRN: [id number]  [name], MD    HPI If eats runs right through her. Stool is yellow or green. Saw mucous. Happens after she eats and sometimes gets the urge and has to go. Milk: NONE. CHEESE: 2-3X/WEEK. ICE CREAM: ONCE A WEEK.  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 NEW MEDS: JANUVIA: 1 YEAR, MGOX 2 WKS AGO. LOOSE STOOLS FOR 3 MOS. UP AT NIGHT TO HAVE BM: 2-3X. SAW BLOOD A LITTLE 2 WKS AGO x1. WILL GET THE SWEATS. MAY HAVE MILD CRAMPY LOWER ABDOMINAL PAIN. BETTER AFTER BM AND IF IT'S REALLY BAD. FILLS COMMODE UP AND THEN GETS RELIEF. NO ASPIRIN, BC/GOODY POWDERS, IBUPROFEN/MOTRIN, OR NAPROXEN/ALEVE. USING ONLY TYLENOL PRN. NEGATIVE FOR C DIFF. NO TRAVEL. WELL WATER BUT DOESN'T DRINK IT. COOKS WITH IT. STRESS; NOT REALLY. BENTYL TWICE HELPS(LESS CRAMPING AND LOOSE STOOLS). NL FORMED BM: THIS AM.   PT DENIES FEVER, CHILLS, HEMATOCHEZIA, nausea, vomiting, melena, diarrhea, CHEST PAIN, SHORTNESS OF BREATH,  CHANGE IN BOWEL IN HABITS, constipation, problems swallowing, problems with sedation, OR heartburn or indigestion.  Past Medical History:  Diagnosis Date  . DVT (deep venous thrombosis) (HCC)    remote past, over 20 years ago   . Essential hypertension, benign   . IBS (irritable bowel syndrome)   . Premature ventricular contractions (PVCs) (VPCs)    Per Dr. Loleta Chance  . Type 2 diabetes mellitus (HCC)    Past Surgical History:  Procedure Laterality Date  . COLONOSCOPY N/A 07/26/2015   Procedure: COLONOSCOPY;  Surgeon: West Bali, MD;  [facility];  Service: Endoscopy;  Laterality: N/A;  130 - moved to 6/26 @1 :30 - office to notify pt  . ESOPHAGOGASTRODUODENOSCOPY N/A 07/26/2015   Procedure: ESOPHAGOGASTRODUODENOSCOPY (EGD);  Surgeon: West Bali, MD;  [facility];  Service: Endoscopy;  Laterality: N/A;  . None     No Known Allergies  Current Outpatient Prescriptions    Medication Sig Dispense Refill  . acetaminophen (TYLENOL) 500 MG tablet Take 1,000 mg by mouth every 6 (six) hours as needed.    Marland Kitchen amLODipine (NORVASC) 5 MG tablet     .      . dicyclomine (BENTYL) 10 MG capsule BID HELPS A LITTLE(2X/DAY)   . MGOX DAILY STOOLS NO WORSE   . HCTZ DAILY     Review of Systems PER HPI OTHERWISE ALL SYSTEMS ARE NEGATIVE.    Objective:   Physical Exam  Constitutional: She is oriented to person, place, and time. She appears well-developed and well-nourished. No distress.  HENT:  Head: Normocephalic and atraumatic.  Mouth/Throat: Oropharynx is clear and moist. No oropharyngeal exudate.  Eyes: Pupils are equal, round, and reactive to light. No scleral icterus.  Neck: Normal range of motion. Neck supple.  Cardiovascular: Normal rate, regular rhythm and normal heart sounds.   Pulmonary/Chest: Effort normal and breath sounds normal. No respiratory distress.  Abdominal: Soft. Bowel sounds are normal. She exhibits no distension. There is tenderness. There is no rebound and no guarding.  MILD PERI-UMBILICAL TTP  Musculoskeletal: She exhibits no edema.  NEOPRENE BAND ON LEFT HAND/WRIST  Lymphadenopathy:    She has no cervical adenopathy.  Neurological: She is alert and oriented to person, place, and time.  NO FOCAL DEFICITS  Psychiatric: She has a normal mood and affect.  Vitals reviewed.     Assessment & Plan:

## 2016-10-04 ENCOUNTER — Ambulatory Visit: Payer: 59 | Admitting: Gastroenterology

## 2017-01-08 ENCOUNTER — Ambulatory Visit: Payer: 59 | Admitting: Adult Health

## 2017-01-10 ENCOUNTER — Telehealth: Payer: Self-pay | Admitting: Adult Health

## 2017-01-10 NOTE — Telephone Encounter (Signed)
Pt c/o headache after PCP changed bp med dt recall. Pt states that she is currently out of medication and is need of a refill. She has an appt on 01/11/17 with our office. Pt will bring name and dose strength at that time.

## 2017-01-10 NOTE — Telephone Encounter (Signed)
PLEASE GIVE PT A CALL CONCERNING HER BP  °

## 2017-01-10 NOTE — Progress Notes (Signed)
Cardiology Office Note    Date:  01/11/2017   ID:  Melissa Casey, DOB [birth year], MRN [id number]  PCP:  [name], MD  Cardiologist: Dr. Wyline Mood   Chief Complaint  Patient presents with  . Follow-up    6 month visit; Elevated BP    History of Present Illness:    Melissa Casey is a 51 y.o. female with past medical history of HTN, Type 2 DM, and palpitations who presents to the office today for 70-month follow-up.   She was last examined by Joni Reining, NP in 07/2016 and denied any recent chest pain or dyspnea on exertion. BP was stable and she was continued on Amlodipine 5mg  daily and Valsartan-HCTZ 160-25mg  daily.  Was experiencing frequent episodes of diarrhea, therefore labs were checked at that time and showed a normal K+ but Mg was low at 1.7, therefore she was started on Magnesium 200mg  daily.   She called the office on 01/10/2017 reporting her BP medications had been changed by her PCP and BP was not well-controlled, therefore a follow-up appointment was arranged.   In talking with the patient today, she reports her Valsartan-HCTZ was stopped by her PCP several months ago due to the medication being recalled. She was switched to Micardis 40 mg daily and continued on HCTZ 25 mg daily.  Micardis was unaffordable for her and the office ran out of samples, therefore she discontinued the medication. She has been having her blood pressure checked at work and systolic numbers have been in the 160's-170's and diastolics in the 90's. She does experience a headache and blurred vision when BP is elevated.   She denies any recent exertional chest pain or dyspnea on exertion. No recent orthopnea, PND, or lower extremity edema.  Past Medical History:  Diagnosis Date  . DVT (deep venous thrombosis) (HCC)    remote past, over 20 years ago   . Essential hypertension, benign   . IBS (irritable bowel syndrome)   . Premature ventricular contractions (PVCs) (VPCs)    Per Dr.  Loleta Chance  . Type 2 diabetes mellitus (HCC)     Past Surgical History:  Procedure Laterality Date  . COLONOSCOPY N/A 07/26/2015   Procedure: COLONOSCOPY;  Surgeon: West Bali, MD;  [facility];  Service: Endoscopy;  Laterality: N/A;  130 - moved to 6/26 @1 :30 - office to notify pt  . ESOPHAGOGASTRODUODENOSCOPY N/A 07/26/2015   Procedure: ESOPHAGOGASTRODUODENOSCOPY (EGD);  Surgeon: West Bali, MD;  [facility];  Service: Endoscopy;  Laterality: N/A;  . None      Current Medications: Outpatient Medications Prior to Visit  Medication Sig Dispense Refill  . acetaminophen (TYLENOL) 500 MG tablet Take 1,000 mg by mouth every 6 (six) hours as needed.    Marland Kitchen amLODipine (NORVASC) 5 MG tablet Take 5 mg by mouth daily.     Marland Kitchen dicyclomine (BENTYL) 10 MG capsule     . Magnesium Oxide 200 MG TABS Take 1 tablet (200 mg total) by mouth daily. 30 tablet 6  . hydrochlorothiazide (HYDRODIURIL) 25 MG tablet Take 25 mg by mouth daily.     Marland Kitchen telmisartan (MICARDIS) 40 MG tablet Take 40 mg by mouth daily.    Marland Kitchen aspirin EC 81 MG tablet Take 81 mg by mouth daily.    . valsartan-hydrochlorothiazide (DIOVAN-HCT) 160-25 MG tablet Take 1 tablet by mouth daily.     No facility-administered medications prior to visit.      Allergies:   Patient has  no known allergies.   Social History   Socioeconomic History  . Marital status: Married    Spouse name: None  . Number of children: 1  . Years of education: None  . Highest education level: None  Social Needs  . Financial resource strain: None  . Food insecurity - worry: None  . Food insecurity - inability: None  . Transportation needs - medical: None  . Transportation needs - non-medical: None  Occupational History  . Occupation: School system    CommentNurse, mental health: Cafeteria  . 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 Occupation: [employer]     Comment: part time  . Occupation:    Tobacco Use  . Smoking status: Never Smoker  .  Smokeless tobacco: Never Used  Substance and Sexual Activity  . Alcohol use: No    Alcohol/week: 0.0 oz  . Drug use: No  . Sexual activity: None  Other Topics Concern  . None  Social History Narrative  . None     Family History:  The patient's family history includes Arrhythmia in her sister; Coronary artery disease in her father and mother; Hypertension in her brother.   Review of Systems:   Please see the history of present illness.     General:  No chills, fever, night sweats or weight changes.  Cardiovascular:  No chest pain, dyspnea on exertion, edema, orthopnea, palpitations, paroxysmal nocturnal dyspnea. Dermatological: No rash, lesions/masses Respiratory: No cough, dyspnea Urologic: No hematuria, dysuria Abdominal:   No nausea, vomiting, diarrhea, bright red blood per rectum, melena, or hematemesis Neurologic:  No visual changes, wkns, changes in mental status. Positive for headaches.   All other systems reviewed and are otherwise negative except as noted above.   Physical Exam:    VS:  BP (!) 164/88   Pulse 84   Ht 5\' 9"  (1.753 m)   Wt 268 lb (121.6 kg)   SpO2 96%   BMI 39.58 kg/m    General: Well developed, overweight African American female appearing in no acute distress. Head: Normocephalic, atraumatic, sclera non-icteric, no xanthomas, nares are without discharge.  Neck: No carotid bruits. JVD not elevated.  Lungs: Respirations regular and unlabored, without wheezes or rales.  Heart: Regular rate and rhythm. No S3 or S4.  No murmur, no rubs, or gallops appreciated. Abdomen: Soft, non-tender, non-distended with normoactive bowel sounds. No hepatomegaly. No rebound/guarding. No obvious abdominal masses. Msk:  Strength and tone appear normal for age. No joint deformities or effusions. Extremities: No clubbing or cyanosis. No lower extremity edema.  Distal pedal pulses are 2+ bilaterally. Neuro: Alert and oriented X 3. Moves all extremities spontaneously. No  focal deficits noted. Psych:  Responds to questions appropriately with a normal affect. Skin: No rashes or lesions noted  Wt Readings from Last 3 Encounters:  01/11/17 268 lb (121.6 kg)  09/21/16 273 lb (123.8 kg)  08/08/16 271 lb (122.9 kg)     Studies/Labs Reviewed:   EKG:  EKG is not ordered today.    Recent Labs: 08/08/2016: BUN 12; Creat 0.77; Magnesium 1.7; Potassium 3.6; Sodium 141 08/09/2016: Hemoglobin 11.5; Platelets 299   Lipid Panel No results found for: CHOL, TRIG, HDL, CHOLHDL, VLDL, LDLCALC, LDLDIRECT  Additional studies/ records that were reviewed today include:   Stress Echocardiogram: 10/2010 Study Conclusions  - Stress ECG conclusions: The stress ECG was negative for ischemia. - Staged echo: There was no echocardiographic evidence for stress-induced ischemia.  Baseline: - LV size was normal. - LV  global systolic function was normal. The estimated LV ejection fraction was 60% to 65%. - Normal wall motion; no LV regional wall motion abnormalities.  Peak stress: - LV size was reduced. - LV global systolic function was hyperdynamic and appropriately augmented from baseline. The estimated LV ejection fraction was 80% to 85%. - No evidence for new LV regional wall motion abnormalities.  Assessment:    1. Essential hypertension   2. Medication management   3. PVC's (premature ventricular contractions)   4. Type 2 diabetes mellitus with complication, without long-term current use of insulin (HCC)      Plan:   In order of problems listed above:  1. HTN - BP is significantly elevated at 190/100 on initial check, at 164/88 on recheck. She was prescribed Amlodipine 5mg  daily, HCTZ 25mg  daily, and Micardis 40mg  daily but was unable to obtain further samples of Micardis from her PCP and is no longer taking the medication. - will continue Amlodipine 5mg  daily and start Lisinopril-HCTZ 20-25mg  daily (on the $4 list and the patient prefers a  combination medication if possible). She will need a repeat BMET in two weeks to assess K+ levels and kidney function. If she develops a cough with ACE-I therapy, would try Losartan 50mg  daily in place of Lisinopril. I encouraged the patient to monitor her BP at home and call back if numbers remain elevated.   2. Palpitations - noted to have PVC's by prior event monitor. She does experience occasional palpitations only lasting for seconds at a time and spontaneously resolving. No associated symptoms. - remains on OTC Magnesium supplementation. Recheck K+ and Mg in 2 weeks in the setting of changing her BP medications.  3. Type 2 DM - followed by PCP.     Medication Adjustments/Labs and Tests Ordered: Current medicines are reviewed at length with the patient today.  Concerns regarding medicines are outlined above.  Medication changes, Labs and Tests ordered today are listed in the Patient Instructions below. Patient Instructions  Medication Instructions:  Your physician has recommended you make the following change in your medication: Stop Taking Micardis  Stop Taking Hydrochlorothiazide  Start Lisinopril - Hydrochlorothiazide 20-25 mg Daily   Labwork: Your physician recommends that you return for lab work in: 2 Weeks   Testing/Procedures: NONE   Follow-Up: Your physician wants you to follow-up in: 6 Months.  You will receive a reminder letter in the mail two months in advance. If you don't receive a letter, please call our office to schedule the follow-up appointment.  Any Other Special Instructions Will Be Listed Below (If Applicable).  If you need a refill on your cardiac medications before your next appointment, please call your pharmacy. Thank you for choosing [facility]!    Signed, Ellsworth LennoxBrittany M Sheenah Dimitroff, PA-C  01/11/2017 1:45 PM    [facility] [address], [address] [place], [place]  [address] Phone: [phone]; Fax: [phone]  [address], [address] [place], [place] [address] Phone: [phone]

## 2017-01-11 ENCOUNTER — Encounter: Payer: Self-pay | Admitting: Student

## 2017-01-11 ENCOUNTER — Ambulatory Visit (INDEPENDENT_AMBULATORY_CARE_PROVIDER_SITE_OTHER): Payer: 59 | Admitting: Student

## 2017-01-11 ENCOUNTER — Encounter: Payer: Self-pay | Admitting: *Deleted

## 2017-01-11 VITALS — BP 164/88 | HR 84 | Ht 69.0 in | Wt 268.0 lb

## 2017-01-11 DIAGNOSIS — I10 Essential (primary) hypertension: Secondary | ICD-10-CM | POA: Diagnosis not present

## 2017-01-11 DIAGNOSIS — I493 Ventricular premature depolarization: Secondary | ICD-10-CM

## 2017-01-11 DIAGNOSIS — E118 Type 2 diabetes mellitus with unspecified complications: Secondary | ICD-10-CM | POA: Diagnosis not present

## 2017-01-11 DIAGNOSIS — Z79899 Other long term (current) drug therapy: Secondary | ICD-10-CM | POA: Diagnosis not present

## 2017-01-11 MED ORDER — LISINOPRIL-HYDROCHLOROTHIAZIDE 20-25 MG PO TABS: 1.0000 | ORAL_TABLET | Freq: Every day | ORAL | 3 refills | Status: DC

## 2017-01-11 NOTE — Addendum Note (Signed)
Addended by: Kerney ElbePINNIX, Esthefany Herrig G on: 01/11/2017 03:33 PM   Modules accepted: Orders

## 2017-01-11 NOTE — Patient Instructions (Signed)
Medication Instructions:  Your physician has recommended you make the following change in your medication: Stop Taking Micardis  Stop Taking Hydrochlorothiazide  Start Lisinopril - Hydrochlorothiazide 20-25 mg Daily   Labwork: Your physician recommends that you return for lab work in: 2 Weeks    Testing/Procedures: NONE   Follow-Up: Your physician wants you to follow-up in: 6 Months.  You will receive a reminder letter in the mail two months in advance. If you don't receive a letter, please call our office to schedule the follow-up appointment.   Any Other Special Instructions Will Be Listed Below (If Applicable).     If you need a refill on your cardiac medications before your next appointment, please call your pharmacy. Thank you for choosing [facility]!

## 2017-01-12 ENCOUNTER — Telehealth: Payer: Self-pay | Admitting: *Deleted

## 2017-01-12 MED ORDER — HYDROCHLOROTHIAZIDE 25 MG PO TABS: 25.0000 mg | ORAL_TABLET | Freq: Every day | ORAL | 3 refills | Status: DC

## 2017-01-12 MED ORDER — LOSARTAN POTASSIUM 50 MG PO TABS: 50.0000 mg | ORAL_TABLET | Freq: Every day | ORAL | 3 refills | Status: DC

## 2017-01-12 NOTE — Telephone Encounter (Signed)
Pt called the office and states that after review of Lisinopril with her mother. Pt was told that her aunts have had angioedema from taking Lisinopril. Pt prefers to be switched to a different medication at this time dt family history.

## 2017-01-12 NOTE — Telephone Encounter (Signed)
Just because family members had this reaction, it does not mean that she will. But at the same time, I can understand her concern. She can continue HCTZ 25mg  daily (will need refills on this) and start Losartan 50mg  daily in place of the Lisinopril-HCTZ.   Thank you!  Best,  [place]renada[place]any

## 2017-01-14 NOTE — Telephone Encounter (Signed)
Also, the patient was previously on Valsartan and tolerated this well so that makes the risk of angioedema with either ACE-I or ARB's relatively low due to not having this reaction in the past.  Signed, Ellsworth LennoxBrittany M Kerin Cecchi, PA-C 01/14/2017, 3:35 PM

## 2017-01-26 ENCOUNTER — Ambulatory Visit: Payer: 59 | Admitting: Adult Health

## 2017-02-28 ENCOUNTER — Ambulatory Visit (HOSPITAL_COMMUNITY)
Admission: RE | Admit: 2017-02-28 | Discharge: 2017-02-28 | Disposition: A | Discharge status: Home / Self Care | Payer: 59 | Source: Ambulatory Visit | Attending: Student | Admitting: Student

## 2017-02-28 ENCOUNTER — Other Ambulatory Visit (HOSPITAL_COMMUNITY)
Admission: RE | Admit: 2017-02-28 | Discharge: 2017-02-28 | Disposition: A | Discharge status: Home / Self Care | Payer: 59 | Source: Ambulatory Visit | Attending: Student | Admitting: Student

## 2017-02-28 DIAGNOSIS — Z79899 Other long term (current) drug therapy: Secondary | ICD-10-CM | POA: Insufficient documentation

## 2017-02-28 DIAGNOSIS — R002 Palpitations: Secondary | ICD-10-CM | POA: Diagnosis not present

## 2017-02-28 LAB — BASIC METABOLIC PANEL
ANION GAP: 13 (ref 5–15)
BUN: 17 mg/dL (ref 6–20)
CALCIUM: 9.3 mg/dL (ref 8.9–10.3)
CO2: 22 mmol/L (ref 22–32)
Chloride: 101 mmol/L (ref 101–111)
Creatinine, Ser: 0.71 mg/dL (ref 0.44–1.00)
GFR calc non Af Amer: >60 mL/min (ref >60)
Glucose, Bld: 162 mg/dL — ABNORMAL HIGH (ref 65–99)
Potassium: 3.6 mmol/L (ref 3.5–5.1)
SODIUM: 136 mmol/L (ref 135–145)

## 2017-02-28 LAB — MAGNESIUM: MAGNESIUM: 1.8 mg/dL (ref 1.7–2.4)

## 2017-03-01 ENCOUNTER — Encounter (HOSPITAL_COMMUNITY): Payer: 59

## 2017-03-07 ENCOUNTER — Emergency Department (HOSPITAL_COMMUNITY)
Admission: EM | Admit: 2017-03-07 | Discharge: 2017-03-08 | Disposition: A | Discharge status: Home / Self Care | Payer: 59 | Attending: Emergency Medicine | Admitting: Emergency Medicine

## 2017-03-07 ENCOUNTER — Other Ambulatory Visit: Payer: Self-pay

## 2017-03-07 ENCOUNTER — Encounter (HOSPITAL_COMMUNITY): Payer: Self-pay | Admitting: Emergency Medicine

## 2017-03-07 DIAGNOSIS — E119 Type 2 diabetes mellitus without complications: Secondary | ICD-10-CM | POA: Diagnosis not present

## 2017-03-07 DIAGNOSIS — Z7984 Long term (current) use of oral hypoglycemic drugs: Secondary | ICD-10-CM | POA: Diagnosis not present

## 2017-03-07 DIAGNOSIS — R002 Palpitations: Secondary | ICD-10-CM

## 2017-03-07 DIAGNOSIS — E876 Hypokalemia: Secondary | ICD-10-CM | POA: Insufficient documentation

## 2017-03-07 DIAGNOSIS — I10 Essential (primary) hypertension: Secondary | ICD-10-CM | POA: Insufficient documentation

## 2017-03-07 DIAGNOSIS — Z79899 Other long term (current) drug therapy: Secondary | ICD-10-CM | POA: Insufficient documentation

## 2017-03-07 LAB — CBC WITH DIFFERENTIAL/PLATELET
Basophils Absolute: 0 10*3/uL (ref 0.0–0.1)
Basophils Relative: 0 %
EOS ABS: 0.1 10*3/uL (ref 0.0–0.7)
EOS PCT: 1 %
HCT: 38.1 % (ref 36.0–46.0)
HEMOGLOBIN: 11.8 g/dL — AB (ref 12.0–15.0)
LYMPHS ABS: 2.7 10*3/uL (ref 0.7–4.0)
Lymphocytes Relative: 40 %
MCH: 25.9 pg — AB (ref 26.0–34.0)
MCHC: 31 g/dL (ref 30.0–36.0)
MCV: 83.6 fL (ref 78.0–100.0)
MONOS PCT: 5 %
Monocytes Absolute: 0.3 10*3/uL (ref 0.1–1.0)
Neutro Abs: 3.7 10*3/uL (ref 1.7–7.7)
Neutrophils Relative %: 54 %
PLATELETS: 292 10*3/uL (ref 150–400)
RBC: 4.56 MIL/uL (ref 3.87–5.11)
RDW: 15.6 % — ABNORMAL HIGH (ref 11.5–15.5)
WBC: 6.8 10*3/uL (ref 4.0–10.5)

## 2017-03-07 LAB — BASIC METABOLIC PANEL
Anion gap: 13 (ref 5–15)
BUN: 15 mg/dL (ref 6–20)
CHLORIDE: 101 mmol/L (ref 101–111)
CO2: 24 mmol/L (ref 22–32)
CREATININE: 0.72 mg/dL (ref 0.44–1.00)
Calcium: 9.2 mg/dL (ref 8.9–10.3)
GFR calc Af Amer: >60 mL/min (ref >60)
GFR calc non Af Amer: >60 mL/min (ref >60)
Glucose, Bld: 164 mg/dL — ABNORMAL HIGH (ref 65–99)
Potassium: 3.2 mmol/L — ABNORMAL LOW (ref 3.5–5.1)
Sodium: 138 mmol/L (ref 135–145)

## 2017-03-07 LAB — TROPONIN I

## 2017-03-07 MED ORDER — POTASSIUM CHLORIDE CRYS ER 20 MEQ PO TBCR
40.0000 meq | EXTENDED_RELEASE_TABLET | Freq: Once | ORAL | Status: AC
  Administered 2017-03-07: 40 meq via ORAL
  Filled 2017-03-07: qty 2

## 2017-03-07 MED ORDER — POTASSIUM CHLORIDE CRYS ER 20 MEQ PO TBCR: 20.0000 meq | EXTENDED_RELEASE_TABLET | Freq: Two times a day (BID) | ORAL | 0 refills | Status: DC

## 2017-03-07 NOTE — ED Provider Notes (Signed)
[facility] EMERGENCY DEPARTMENT Provider Note   CSN: [id number] Arrival date & time: 03/07/17  1547     History   Chief Complaint Chief Complaint  Patient presents with  . Palpitations    HPI Melissa Casey is a 52 y.o. female.  HPI  52 year old female with a history of type 2 diabetes presents with palpitations and hypertension.  She has a known history of hypertension and has been compliant with her medicines, most recently taking today.  She states she has had on and off palpitations and recently had a Holter monitor ending last week.  She has not found out the results of this.  Last night she developed some palpitations and then some right lateral neck discomfort.  The seem to go away.  Today at work she had a recurrent episode of the right neck discomfort and it has remained.  Has also had palpitations.  Checked her blood pressure at the nurse's office and it was 180 systolic.  She has had some epigastric burning as well.  She denies specific chest pain or shortness of breath.  Right now is not having the palpitations.  She thinks the neck pain is from sleeping wrong.  Past Medical History:  Diagnosis Date  . DVT (deep venous thrombosis) (HCC)    remote past, over 20 years ago   . Essential hypertension, benign   . IBS (irritable bowel syndrome)   . Premature ventricular contractions (PVCs) (VPCs)    Per Dr. Loleta Chance  . Type 2 diabetes mellitus [facility])     Patient Active Problem List   Diagnosis Date Noted  . Diarrhea 09/21/2016  . Absolute anemia   . Anemia 06/10/2015  . Constipation 06/10/2015  . Rectal bleeding 06/10/2015  . Neck pain on left side 01/04/2015  . Chest pain 11/16/2010  . Shortness of breath 11/16/2010  . PVC's (premature ventricular contractions) 11/16/2010  . Essential hypertension, benign 11/16/2010  . Type II or unspecified type diabetes mellitus without mention of complication, uncontrolled 11/16/2010    Past Surgical History:  Procedure  Laterality Date  . COLONOSCOPY N/A 07/26/2015   Procedure: COLONOSCOPY;  Surgeon: West Bali, MD;  [facility];  Service: Endoscopy;  Laterality: N/A;  130 - moved to 6/26 @1 :30 - office to notify pt  . ESOPHAGOGASTRODUODENOSCOPY N/A 07/26/2015   Procedure: ESOPHAGOGASTRODUODENOSCOPY (EGD);  Surgeon: West Bali, MD;  [facility];  Service: Endoscopy;  Laterality: N/A;  . None      OB History    Gravida Para Term Preterm AB Living   1 1 1          SAB TAB Ectopic Multiple Live Births                   Home Medications    Prior to Admission medications   Medication Sig Start Date End Date Taking? Authorizing Provider  acetaminophen (TYLENOL) 500 MG tablet Take 1,000 mg by mouth every 6 (six) hours as needed.   Yes [provider]  amLODipine (NORVASC) 5 MG tablet Take 10 mg by mouth daily.  07/13/16  Yes [provider]  hydrochlorothiazide (HYDRODIURIL) 25 MG tablet Take 1 tablet (25 mg total) by mouth daily. 01/12/17 04/12/17 Yes Strader, Lennart Pall, PA-C  losartan (COZAAR) 50 MG tablet Take 1 tablet (50 mg total) by mouth daily. Patient taking differently: Take 100 mg by mouth daily.  01/12/17 04/12/17 Yes Strader, Lennart Pall, PA-C  sitaGLIPtin-metformin (JANUMET) 50-1000 MG tablet Take  1 tablet by mouth at bedtime.   Yes [provider]  potassium chloride SA (K-DUR,KLOR-CON) 20 MEQ tablet Take 1 tablet (20 mEq total) by mouth 2 (two) times daily. 03/07/17   Pricilla LovelessGoldston, Tzirel Leonor, MD    Family History Family History  Problem Relation Age of Onset  . Coronary artery disease Mother   . Coronary artery disease Father        Died age 52  . Hypertension Brother   . Arrhythmia Sister   . Colon cancer Neg Hx     Social History Social History   Tobacco Use  . Smoking status: Never Smoker  . Smokeless tobacco: Never Used  Substance Use Topics  . Alcohol use: No    Alcohol/week: 0.0 oz  . Drug use: No     Allergies   Patient  has no known allergies.   Review of Systems Review of Systems  Respiratory: Negative for shortness of breath.   Cardiovascular: Positive for palpitations. Negative for chest pain and leg swelling.  Gastrointestinal: Positive for abdominal pain. Negative for vomiting.  Musculoskeletal: Positive for neck pain.  All other systems reviewed and are negative.    Physical Exam Updated Vital Signs BP (!) 155/74 (BP Location: Right Arm)   Pulse 79   Temp 98.5 F (36.9 C) (Oral)   Resp 15   Ht 5\' 9"  (1.753 m)   Wt 122.5 kg (270 lb)   SpO2 100%   BMI 39.87 kg/m   Physical Exam  Constitutional: She is oriented to person, place, and time. She appears well-developed and well-nourished. No distress.  Morbidly obese  HENT:  Head: Normocephalic and atraumatic.  Right Ear: External ear normal.  Left Ear: External ear normal.  Nose: Nose normal.  Eyes: Right eye exhibits no discharge. Left eye exhibits no discharge.  Neck: Normal range of motion. Neck supple. Muscular tenderness (mild) present.    Cardiovascular: Normal rate, regular rhythm and normal heart sounds.  Pulmonary/Chest: Effort normal and breath sounds normal.  Abdominal: Soft. There is no tenderness.  Neurological: She is alert and oriented to person, place, and time.  Skin: Skin is warm and dry. She is not diaphoretic.  Nursing note and vitals reviewed.    ED Treatments / Results  Labs (all labs ordered are listed, but only abnormal results are displayed) Labs Reviewed  BASIC METABOLIC PANEL - Abnormal; Notable for the following components:      Result Value   Potassium 3.2 (*)    Glucose, Bld 164 (*)    All other components within normal limits  CBC WITH DIFFERENTIAL/PLATELET - Abnormal; Notable for the following components:   Hemoglobin 11.8 (*)    MCH 25.9 (*)    RDW 15.6 (*)    All other components within normal limits  TROPONIN I  TROPONIN I    EKG  EKG Interpretation  Date/Time:  Wednesday March 07 2017 18:38:53 EST Ventricular Rate:  80 PR Interval:    QRS Duration: 97 QT Interval:  396 QTC Calculation: 457 R Axis:   0 Text Interpretation:  Sinus rhythm Low voltage, precordial leads nonspecific T waves. no significant change since Feb 2017 Confirmed by Pricilla LovelessGoldston, Colleen Kotlarz [phone]([id number]) on 03/07/2017 7:04:05 PM       Radiology No results found.  Procedures Procedures (including critical care time)  Medications Ordered in ED Medications  potassium chloride SA (K-DUR,KLOR-CON) CR tablet 40 mEq (40 mEq Oral Given 03/07/17 2128)     Initial Impression / Assessment and Plan /  ED Course  I have reviewed the triage vital signs and the nursing notes.  Pertinent labs & imaging results that were available during my care of the patient were reviewed by me and considered in my medical decision making (see chart for details).     Patient's palpitations are likely PVCs which are occasionally seen on the monitor in the ED.  However no further arrhythmias noted.  She has mild hypokalemia which will be repleted and could be increasing the amount of PVCs.  Otherwise vital signs stable besides hypertension and her lab work is otherwise baseline.  Given vague neck pain that is likely muscular given location and some tenderness as well as the epigastric pain, troponins have been sent.  First troponin negative, if second troponin is negative then she can follow-up with her cardiologist as an outpatient. Care to Dr. Clarene Duke with 2nd trop pending. I think ACS is of low likelihood.  Final Clinical Impressions(s) / ED Diagnoses   Final diagnoses:  Palpitations  Hypokalemia    ED Discharge Orders        Ordered    potassium chloride SA (K-DUR,KLOR-CON) 20 MEQ tablet  2 times daily     03/07/17 2153       Pricilla Loveless, MD 03/07/17 2159

## 2017-03-07 NOTE — ED Triage Notes (Signed)
Patient reports palpitations that have been intermittent since last night. Was checked by the nurse at work and BP was high. Patient reports she has had some nausea.

## 2017-03-08 LAB — TROPONIN I: Troponin I: <0.03 ng/mL (ref <0.03)

## 2017-03-13 NOTE — Progress Notes (Signed)
Cardiology Office Note    Date:  03/15/2017   ID:  Melissa Casey, DOB September 16, 1965, MRN [id number]  PCP:  [name], MD  Cardiologist: Dina Rich, MD    Chief Complaint  Patient presents with  . Follow-up    recent Emergency Dept visit; recurrent palpitations    History of Present Illness:    Melissa Casey is a 52 y.o. female with past medical history of HTN, Type 2 DM and palpitations who presents to the office today for Emergency Department follow-up.   She was last evaluated by myself on 01/11/2017 and reported having elevated blood pressure when checked at home due to Valsartan-HCTZ being discontinued by her PCP and being switched to HCTZ 25 mg daily alone. She was also previously on Micardis but was unable to afford the medication.  At the time of her visit, BP was elevated to 164/88.  She was continued on Amlodipine 5 mg daily and Lisinopril-HCTZ 20-25mg  daily was added to her medication regimen (taking the place of HCTZ alone). She called the office the next day reporting her aunt had experienced angioedema with Lisinopril and preferred to be switched to a different medication (althought she had been on ARB therapy in the past without any noted side-effects). She was therefore continued on Amlodipine, HCTZ, and started on Losartan 50 mg daily.  She presented to the ED on 03/07/2017 for evaluation of intermittent palpitations and nausea.  She was monitored on telemetry while in the ED and found to have frequent PVCs in the setting of hypokalemia. Initial and delta troponin values were negative and she was informed to follow-up with Cardiology in the outpatient setting. A Holter monitor was obtained by her PCP and showed normal sinus rhythm with frequent PVCs and occasional PAC's with no significant arrhythmias.  In talking with the patient today, she reports having continual episodes of palpitations since her recent ED visit.  This can last for a few seconds at a time  and spontaneously resolve. Does experience occasional lightheadedness but no chest discomfort, dyspnea, or presyncope. No recent orthopnea, PND, or lower extremity edema.  Losartan was recently titrated to 100mg  daily by her PCP secondary to uncontrolled blood pressure. She reports tolerating this addition well. Blood pressure remains elevated at 142/88 during today's visit.    Past Medical History:  Diagnosis Date  . DVT (deep venous thrombosis) (HCC)    remote past, over 20 years ago   . Essential hypertension, benign   . IBS (irritable bowel syndrome)   . Premature ventricular contractions (PVCs) (VPCs)    Per Dr. Loleta Chance  . Type 2 diabetes mellitus (HCC)     Past Surgical History:  Procedure Laterality Date  . COLONOSCOPY N/A 07/26/2015   Procedure: COLONOSCOPY;  Surgeon: West Bali, MD;  [facility];  Service: Endoscopy;  Laterality: N/A;  130 - moved to 6/26 @1 :30 - office to notify pt  . ESOPHAGOGASTRODUODENOSCOPY N/A 07/26/2015   Procedure: ESOPHAGOGASTRODUODENOSCOPY (EGD);  Surgeon: West Bali, MD;  Location: [facility];  Service: Endoscopy;  Laterality: N/A;  . None      Current Medications: Outpatient Medications Prior to Visit  Medication Sig Dispense Refill  . acetaminophen (TYLENOL) 500 MG tablet Take 1,000 mg by mouth every 6 (six) hours as needed.    Marland Kitchen amLODipine (NORVASC) 10 MG tablet Take 10 mg by mouth daily.    . hydrochlorothiazide (HYDRODIURIL) 25 MG tablet Take 1 tablet (25 mg total) by mouth daily. 90  tablet 3  . ibuprofen (ADVIL,MOTRIN) 800 MG tablet     . losartan (COZAAR) 100 MG tablet Take 100 mg by mouth daily.    . sitaGLIPtin-metformin (JANUMET) 50-1000 MG tablet Take 1 tablet by mouth at bedtime.    Marland Kitchen. amLODipine (NORVASC) 5 MG tablet Take 10 mg by mouth daily.     Marland Kitchen. losartan (COZAAR) 50 MG tablet Take 1 tablet (50 mg total) by mouth daily. (Patient taking differently: Take 100 mg by mouth daily. ) 90 tablet 3  . potassium chloride  SA (K-DUR,KLOR-CON) 20 MEQ tablet Take 1 tablet (20 mEq total) by mouth 2 (two) times daily. 6 tablet 0   No facility-administered medications prior to visit.      Allergies:   Patient has no known allergies.   Social History   Socioeconomic History  . Marital status: Married    Spouse name: None  . Number of children: 1  . Years of education: None  . Highest education level: None  Social Needs  . Financial resource strain: None  . Food insecurity - worry: None  . Food insecurity - inability: None  . Transportation needs - medical: None  . Transportation needs - non-medical: None  Occupational History  . Occupation: School system    CommentNurse, mental health: Cafeteria  . 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 Occupation: [employer]     Comment: part time  . Occupation:    Tobacco Use  . Smoking status: Never Smoker  . Smokeless tobacco: Never Used  Substance and Sexual Activity  . Alcohol use: No    Alcohol/week: 0.0 oz  . Drug use: No  . Sexual activity: None  Other Topics Concern  . None  Social History Narrative  . None     Family History:  The patient's family history includes Arrhythmia in her sister; Coronary artery disease in her father and mother; Hypertension in her brother.   Review of Systems:   Please see the history of present illness.     General:  No chills, fever, night sweats or weight changes.  Cardiovascular:  No chest pain, dyspnea on exertion, edema, orthopnea, paroxysmal nocturnal dyspnea. Positive for palpitations.  Dermatological: No rash, lesions/masses Respiratory: No cough, dyspnea Urologic: No hematuria, dysuria Abdominal:   No nausea, vomiting, diarrhea, bright red blood per rectum, melena, or hematemesis Neurologic:  No visual changes, wkns, changes in mental status. All other systems reviewed and are otherwise negative except as noted above.   Physical Exam:    VS:  BP (!) 142/88   Pulse 89   Ht 5\' 9"  (1.753 m)   Wt 270 lb 9.6 oz  (122.7 kg)   SpO2 98%   BMI 39.96 kg/m    General: Well developed, overweight African American female appearing in no acute distress. Head: Normocephalic, atraumatic, sclera non-icteric, no xanthomas, nares are without discharge.  Neck: No carotid bruits. JVD not elevated.  Lungs: Respirations regular and unlabored, without wheezes or rales.  Heart: Regular rate and rhythm with occasional ectopic beats. No S3 or S4.  No murmur, no rubs, or gallops appreciated. Abdomen: Soft, non-tender, non-distended with normoactive bowel sounds. No hepatomegaly. No rebound/guarding. No obvious abdominal masses. Msk:  Strength and tone appear normal for age. No joint deformities or effusions. Extremities: No clubbing or cyanosis. No lower extremity edema.  Distal pedal pulses are 2+ bilaterally. Neuro: Alert and oriented X 3. Moves all extremities spontaneously. No focal deficits noted. Psych:  Responds to questions appropriately  with a normal affect. Skin: No rashes or lesions noted  Wt Readings from Last 3 Encounters:  03/15/17 270 lb 9.6 oz (122.7 kg)  03/07/17 270 lb (122.5 kg)  01/11/17 268 lb (121.6 kg)     Studies/Labs Reviewed:   EKG:  EKG is not ordered today.  EKG from 03/07/2017 is reviewed which shows NSR, HR 80, with no acute ST or T-wave changes when compared to prior tracings.    Recent Labs: 02/28/2017: Magnesium 1.8 03/07/2017: BUN 15; Creatinine, Ser 0.72; Hemoglobin 11.8; Platelets 292; Potassium 3.2; Sodium 138   Lipid Panel No results found for: CHOL, TRIG, HDL, CHOLHDL, VLDL, LDLCALC, LDLDIRECT  Additional studies/ records that were reviewed today include:    Stress Echocardiogram: 2012 Baseline:  - LV size was normal. - LV global systolic function was normal. The estimated LV ejection fraction was 60% to 65%. - Normal wall motion; no LV regional wall motion abnormalities. Peak stress:  - LV size was reduced. - LV global systolic function was hyperdynamic  and appropriately augmented from baseline. The estimated LV ejection fraction was 80% to 85%. - No evidence for new LV regional wall motion abnormalities.  Holter Monitor: 02/28/2017  Sinus rhythm with frequent PVC's and occasional PAC's.  No diary was returned.  Assessment:    1. PVC's (premature ventricular contractions)   2. Palpitations   3. Essential hypertension, benign      Plan:   In order of problems listed above:  1. Premature Ventricular Contractions/ Palpitations - the patient was recently evaluated in the ED for intermittent palpitations and nausea, found to have frequent PVCs in the setting of hypokalemia. Initial and delta troponin values were negative with EKG showing no acute ischemic changes. Holter monitor was obtained by her PCP and showed normal sinus rhythm with frequent PVC's and occasional PAC's with no significant arrhythmias. - she reports frequent episodes of palpitations occurring since, only lasting for a few seconds at a time. Has repeat labs scheduled with her PCP next week. Will obtain an echocardiogram to assess LV function and wall motion. Will start Lopressor 25mg  BID to assist with her frequent ectopy (informed the patient she can cut the tablet in half and take 12.5mg  BID if she notes side-effects to the medication). Recommended reduction in caffeine intake. She does not consume alcoholic beverages.   2. HTN - BP is slightly elevated at 142/88 during today's visit. - remains on Amlodipine 10mg  daily, HCTZ 25mg  daily, and Losartan 100mg  daily (just titrated by her PCP two weeks ago). Will start Lopressor as outlined above.   3. Type 2 DM - followed by her PCP. Remains on Janumet 50-1000mg  daily.     Medication Adjustments/Labs and Tests Ordered: Current medicines are reviewed at length with the patient today.  Concerns regarding medicines are outlined above.  Medication changes, Labs and Tests ordered today are listed in the Patient  Instructions below. Patient Instructions  Your physician recommends that you schedule a follow-up appointment in:  3 months with B Talani Brazee PA-C  START Lopressor 25 mg twice a day  Your physician has requested that you have an echocardiogram. Echocardiography is a painless test that uses sound waves to create images of your heart. It provides your doctor with information about the size and shape of your heart and how well your heart's chambers and valves are working. This procedure takes approximately one hour. There are no restrictions for this procedure.  No lab work ordered today.  Thank you for choosing [facility] !         Signed, Ellsworth Lennox, PA-C  03/15/2017 2:29 PM    [facility] [address] [place], [place] [address] Phone: [phone]

## 2017-03-15 ENCOUNTER — Encounter: Payer: Self-pay | Admitting: Student

## 2017-03-15 ENCOUNTER — Ambulatory Visit (INDEPENDENT_AMBULATORY_CARE_PROVIDER_SITE_OTHER): Payer: 59 | Admitting: Student

## 2017-03-15 ENCOUNTER — Encounter: Payer: Self-pay | Admitting: *Deleted

## 2017-03-15 VITALS — BP 142/88 | HR 89 | Ht 69.0 in | Wt 270.6 lb

## 2017-03-15 DIAGNOSIS — E118 Type 2 diabetes mellitus with unspecified complications: Secondary | ICD-10-CM | POA: Diagnosis not present

## 2017-03-15 DIAGNOSIS — I493 Ventricular premature depolarization: Secondary | ICD-10-CM

## 2017-03-15 DIAGNOSIS — R002 Palpitations: Secondary | ICD-10-CM | POA: Diagnosis not present

## 2017-03-15 DIAGNOSIS — I10 Essential (primary) hypertension: Secondary | ICD-10-CM | POA: Diagnosis not present

## 2017-03-15 MED ORDER — METOPROLOL TARTRATE 25 MG PO TABS: 25.0000 mg | ORAL_TABLET | Freq: Two times a day (BID) | ORAL | 3 refills | Status: DC

## 2017-03-15 NOTE — Patient Instructions (Addendum)
Your physician recommends that you schedule a follow-up appointment in:  3 months with Dr Wyline MoodBranch    START Lopressor 25 mg twice a day    Your physician has requested that you have an echocardiogram. Echocardiography is a painless test that uses sound waves to create images of your heart. It provides your doctor with information about the size and shape of your heart and how well your heart's chambers and valves are working. This procedure takes approximately one hour. There are no restrictions for this procedure.     No lab work ordered today.      Thank you for choosing [facility] !

## 2017-03-19 ENCOUNTER — Ambulatory Visit (HOSPITAL_COMMUNITY): Payer: 59

## 2017-03-23 ENCOUNTER — Ambulatory Visit (HOSPITAL_COMMUNITY)
Admission: RE | Admit: 2017-03-23 | Discharge: 2017-03-23 | Disposition: A | Discharge status: Home / Self Care | Payer: 59 | Source: Ambulatory Visit | Attending: Student | Admitting: Student

## 2017-03-23 DIAGNOSIS — I10 Essential (primary) hypertension: Secondary | ICD-10-CM | POA: Diagnosis not present

## 2017-03-23 DIAGNOSIS — I493 Ventricular premature depolarization: Secondary | ICD-10-CM | POA: Insufficient documentation

## 2017-03-23 DIAGNOSIS — R002 Palpitations: Secondary | ICD-10-CM | POA: Insufficient documentation

## 2017-03-23 DIAGNOSIS — I517 Cardiomegaly: Secondary | ICD-10-CM | POA: Insufficient documentation

## 2017-03-23 LAB — ECHOCARDIOGRAM COMPLETE
AVLVOTPG: 4 mmHg
CHL CUP MV DEC (S): 246
CHL CUP STROKE VOLUME: 58 mL
E decel time: 246 msec
E/e' ratio: 13.78
FS: 50 % — AB (ref 28–44)
IVS/LV PW RATIO, ED: 0.96
LA ID, A-P, ES: 38 mm
LA diam end sys: 38 mm
LA diam index: 1.52 cm/m2
LA vol index: 32.8 mL/m2
LA vol: 82.1 mL
LAVOLA4C: 75.1 mL
LDCA: 3.14 cm2
LV E/e' medial: 13.78
LV PW d: 13.3 mm — AB (ref 0.6–1.1)
LV SIMPSON'S DISK: 63
LV TDI E'MEDIAL: 6.85
LV e' LATERAL: 6.96 cm/s
LV sys vol index: 14 mL/m2
LVDIAVOL: 93 mL (ref 46–106)
LVDIAVOLIN: 37 mL/m2
LVEEAVG: 13.78
LVOT SV: 79 mL
LVOT VTI: 25.3 cm
LVOTD: 20 mm
LVOTPV: 98.2 cm/s
LVSYSVOL: 35 mL
Lateral S' vel: 13.6 cm/s
MV Peak grad: 4 mmHg
MV pk A vel: 82.1 m/s
MVPKEVEL: 95.9 m/s
RV TAPSE: 21.3 mm
TDI e' lateral: 6.96

## 2017-03-23 NOTE — Progress Notes (Signed)
*  PRELIMINARY RESULTS* Echocardiogram 2D Echocardiogram has been performed.  Stacey DrainWhite, Madison Direnzo J 03/23/2017, 4:17 PM

## 2017-06-12 ENCOUNTER — Ambulatory Visit: Payer: 59 | Admitting: Cardiology

## 2017-06-12 NOTE — Progress Notes (Deleted)
Clinical Summary Melissa Casey is a 52 y.o.female  1. Palpitations/PVCs - prior holter with fairly rare PVCs, PACs. No significant arrhythmias - started on lopressor  bid - 03/2017 echo LVEF 60-65%, no WMAs   2. HTN - does not check regularly - has not taken bp meds x 4 days  - limits sodium intake  3. Atypical chest pain - negative stress echo - from notes thought to be related to GERD - still with occassional mild symptoms  Past Medical History:  Diagnosis Date  . DVT (deep venous thrombosis) (HCC)    remote past, over 20 years ago   . Essential hypertension, benign   . IBS (irritable bowel syndrome)   . Premature ventricular contractions (PVCs) (VPCs)    Per Dr. Loleta Chance  . Type 2 diabetes mellitus (HCC)      No Known Allergies   Current Outpatient Medications  Medication Sig Dispense Refill  . acetaminophen (TYLENOL) 500 MG tablet Take 1,000 mg by mouth every 6 (six) hours as needed.    Marland Kitchen amLODipine (NORVASC) 10 MG tablet Take 10 mg by mouth daily.    . hydrochlorothiazide (HYDRODIURIL) 25 MG tablet Take 1 tablet (25 mg total) by mouth daily. 90 tablet 3  . ibuprofen (ADVIL,MOTRIN) 800 MG tablet     . losartan (COZAAR) 100 MG tablet Take 100 mg by mouth daily.    . metoprolol tartrate (LOPRESSOR) 25 MG tablet Take 1 tablet (25 mg total) by mouth 2 (two) times daily. 180 tablet 3  . sitaGLIPtin-metformin (JANUMET) 50-1000 MG tablet Take 1 tablet by mouth at bedtime.     No current facility-administered medications for this visit.      Past Surgical History:  Procedure Laterality Date  . COLONOSCOPY N/A 07/26/2015   Procedure: COLONOSCOPY;  Surgeon: West Bali, MD;  [facility]: [facility];  Service: Endoscopy;  Laterality: N/A;  130 - moved to 6/26 :30 - office to notify pt  . ESOPHAGOGASTRODUODENOSCOPY N/A 07/26/2015   Procedure: ESOPHAGOGASTRODUODENOSCOPY (EGD);  Surgeon: West Bali, MD;  [facility]: [facility];  Service: Endoscopy;   Laterality: N/A;  . None       No Known Allergies    Family History  Problem Relation Age of Onset  . Coronary artery disease Mother   . Coronary artery disease Father        Died age 102  . Hypertension Brother   . Arrhythmia Sister   . Colon cancer Neg Hx      Social History Melissa Casey reports that she has never smoked. She has never used smokeless tobacco. Melissa Casey reports that she does not drink alcohol.   Review of Systems CONSTITUTIONAL: No weight loss, fever, chills, weakness or fatigue.  HEENT: Eyes: No visual loss, blurred vision, double vision or yellow sclerae.No hearing loss, sneezing, congestion, runny nose or sore throat.  SKIN: No rash or itching.  CARDIOVASCULAR:  RESPIRATORY: No shortness of breath, cough or sputum.  GASTROINTESTINAL: No anorexia, nausea, vomiting or diarrhea. No abdominal pain or blood.  GENITOURINARY: No burning on urination, no polyuria NEUROLOGICAL: No headache, dizziness, syncope, paralysis, ataxia, numbness or tingling in the extremities. No change in bowel or bladder control.  MUSCULOSKELETAL: No muscle, back pain, joint pain or stiffness.  LYMPHATICS: No enlarged nodes. No history of splenectomy.  PSYCHIATRIC: No history of depression or anxiety.  ENDOCRINOLOGIC: No reports of sweating, cold or heat intolerance. No polyuria or polydipsia.  Marland Kitchen   Physical Examination There were  no vitals filed for this visit. There were no vitals filed for this visit.  Gen: resting comfortably, no acute distress HEENT: no scleral icterus, pupils equal round and reactive, no palptable cervical adenopathy,  CV Resp: Clear to auscultation bilaterally GI: abdomen is soft, non-tender, non-distended, normal bowel sounds, no hepatosplenomegaly MSK: extremities are warm, no edema.  Skin: warm, no rash Neuro:  no focal deficits Psych: appropriate affect   Diagnostic Studies 10/2010 Stress echo Study Conclusions  - Stress ECG conclusions: The  stress ECG was negative for ischemia. - Staged echo: There was no echocardiographic evidence for stress-induced ischemia.   03/2014 event monitor: no arrhythmias  03/2017 echo Study Conclusions  - Left ventricle: The cavity size was normal. Wall thickness was   increased in a pattern of mild LVH. Systolic function was normal.   The estimated ejection fraction was in the range of 60% to 65%.   Wall motion was normal; there were no regional wall motion   abnormalities. Left ventricular diastolic function parameters   were normal for the patient&'s age. - Mitral valve: There was trivial regurgitation. - Left atrium: The atrium was at the upper limits of normal in   size. - Right atrium: Central venous pressure (est): 3 mm Hg. - Tricuspid valve: There was trivial regurgitation. - Pulmonary arteries: Systolic pressure could not be accurately   estimated. - Pericardium, extracardiac: There was no pericardial effusion.   Jan 2019 holter: min HR 61, Max HR 130, Avg HR 83. Rare supraventricular ectopy. Rare ventricular ectopy 1%. No significcant sustained arrhythmias   Assessment and Plan  1. Palpitations - will obtain 7 day event monitor  2. HTN - will refill current meds  3. Chest pain - recent negative stress echo, symptoms most consistent with GI etiology. COntinue antacid   F/u 3-4 weeks       Antoine Poche, M.D., F.A.C.C.

## 2018-03-13 ENCOUNTER — Other Ambulatory Visit: Payer: Self-pay | Admitting: Student

## 2018-07-03 ENCOUNTER — Ambulatory Visit: Payer: 59 | Admitting: Gastroenterology

## 2018-07-31 ENCOUNTER — Encounter: Payer: Self-pay | Admitting: Gastroenterology

## 2018-07-31 ENCOUNTER — Ambulatory Visit: Payer: 59 | Admitting: Gastroenterology

## 2018-07-31 ENCOUNTER — Other Ambulatory Visit: Payer: Self-pay

## 2018-07-31 VITALS — BP 170/87 | HR 73 | Temp 97.3°F | Ht 69.0 in | Wt 269.6 lb

## 2018-07-31 DIAGNOSIS — R197 Diarrhea, unspecified: Secondary | ICD-10-CM | POA: Diagnosis not present

## 2018-07-31 NOTE — Patient Instructions (Signed)
DRINK WATER TO KEEP YOUR URINE LIGHT YELLOW.  FOLLOW A LACTOSE FREE DIET. SEE INFO BELOW.   ADD LACTASE 3 PILLS WITH MEALS THREE TIMES A DAY.   SUBMIT STOOL STUDIES.  FOLLOW UP IN 6-12 MOS.     Lactose Free Diet Lactose is a carbohydrate that is found mainly in milk and milk products, as well as in foods with added milk or whey. Lactose must be digested by the enzyme in order to be used by the body. Lactose intolerance occurs when there is a shortage of lactase. When your body is not able to digest lactose, you may feel sick to your stomach (nausea), bloating, cramping, gas and diarrhea.  There are many dairy products that may be tolerated better than milk by some people:  The use of cultured dairy products such as yogurt, buttermilk, cottage cheese, and sweet acidophilus milk (Kefir) for lactase-deficient individuals is usually well tolerated. This is because the healthy bacteria help digest lactose.   Lactose-hydrolyzed milk (Lactaid) contains 40-90% less lactose than milk and may also be well tolerated.    SPECIAL NOTES  Lactose is a carbohydrates. The major food source is dairy products. Reading food labels is important. Many products contain lactose even when they are not made from milk. Look for the following words: whey, milk solids, dry milk solids, nonfat dry milk powder. Typical sources of lactose other than dairy products include breads, candies, cold cuts, prepared and processed foods, and commercial sauces and gravies.   All foods must be prepared without milk, cream, or other dairy foods.   Soy milk and lactose-free supplements (LACTASE) may be used as an alternative to milk.   FOOD GROUP ALLOWED/RECOMMENDED AVOID/USE SPARINGLY  BREADS / STARCHES 4 servings or more* Breads and rolls made without milk. [place]French, [place]ienna, or [place] bread. Breads and rolls that contain milk. Prepared mixes such as muffins, biscuits, waffles, pancakes. Sweet rolls, donuts, [place] toast (if  made with milk or lactose).  Crackers: Soda crackers, graham crackers. Any crackers prepared without lactose. Zwieback crackers, corn curls, or any that contain lactose.  Cereals: Cooked or dry cereals prepared without lactose (read labels). Cooked or dry cereals prepared with lactose (read labels). Total, Cocoa Krispies. Special K.  Potatoes / Pasta / Rice: Any prepared without milk or lactose. Popcorn. Instant potatoes, frozen Jamaica[place] fries, scalloped or au gratin potatoes.  VEGETABLES 2 servings or more Fresh, frozen, and canned vegetables. Creamed or breaded vegetables. Vegetables in a cheese sauce or with lactose-containing margarines.  FRUIT 2 servings or more All fresh, canned, or frozen fruits that are not processed with lactose. Any canned or frozen fruits processed with lactose.  MEAT & SUBSTITUTES 2 servings or more (4 to 6 oz. total per day) Plain beef, chicken, fish, Malaw[place]urkey, lamb, veal, pork, or ham. Kosher prepared meat products. Strained or junior meats that do not contain milk. Eggs, soy meat substitutes, nuts. Scrambled eggs, omelets, and souffles that contain milk. Creamed or breaded meat, fish, or fowl. Sausage products such as wieners, liver sausage, or cold cuts that contain milk solids. Cheese, cottage cheese, or cheese spreads.  MILK None. (See "BEVERAGES" for milk substitutes. See "DESSERTS" for ice cream and frozen desserts.) Milk (whole, 2%, skim, or chocolate). Evaporated, powdered, or condensed milk; malted milk.  SOUPS & COMBINATION FOODS Bouillon, broth, vegetable soups, clear soups, consomms. Homemade soups made with allowed ingredients. Combination or prepared foods that do not contain milk or milk products (read labels). Cream soups, chowders, commercially prepared soups  containing lactose. Macaroni and cheese, pizza. Combination or prepared foods that contain milk or milk products.  DESSERTS & SWEETS In moderation Water and fruit ices; gelatin; angel food cake.  Homemade cookies, pies, or cakes made from allowed ingredients. Pudding (if made with water or a milk substitute). Lactose-free tofu desserts. Sugar, honey, corn syrup, jam, jelly; marmalade; molasses (beet sugar); Pure sugar candy; marshmallows. Ice cream, ice milk, sherbet, custard, pudding, frozen yogurt. Commercial cake and cookie mixes. Desserts that contain chocolate. Pie crust made with milk-containing margarine; reduced-calorie desserts made with a sugar substitute that contains lactose. Toffee, peppermint, butterscotch, chocolate, caramels.  FATS & OILS In moderation Butter (as tolerated; contains very small amounts of lactose). Margarines and dressings that do not contain milk, Vegetable oils, shortening, Miracle Whip, mayonnaise, nondairy cream & whipped toppings without lactose or milk solids added (examples: Coffee Rich, Carnation Coffeemate, Rich's Whipped Topping, PolyRich). Berniece Salines. Margarines and salad dressings containing milk; cream, cream cheese; peanut butter with added milk solids, sour cream, chip dips, made with sour cream.  BEVERAGES Carbonated drinks; tea; coffee and freeze-dried coffee; some instant coffees (check labels). Fruit drinks; fruit and vegetable juice; Rice or Soy milk. Ovaltine, hot chocolate. Some cocoas; some instant coffees; instant iced teas; powdered fruit drinks (read labels).   CONDIMENTS / MISCELLANEOUS Soy sauce, carob powder, olives, gravy made with water, baker's cocoa, pickles, pure seasonings and spices, wine, pure monosodium glutamate, catsup, mustard. Some chewing gums, chocolate, some cocoas. Certain antibiotics and vitamin / mineral preparations. Spice blends if they contain milk products. MSG extender. Artificial sweeteners that contain lactose such as Equal (Nutra-Sweet) and Sweet 'n Low. Some nondairy creamers (read labels).   SAMPLE MENU*  Breakfast   Orange Juice.  Banana.   Bran flakes.   Nondairy Creamer.  Vienna Bread (toasted).    Butter   Coffee or tea.    Noon Meal   Chicken Breast.  Rice.   Green beans.   Butter  Fresh melon.   Coffee or tea.    Evening Meal   Roast Beef.  Baked potato.   Butter .   Broccoli.   Lettuce salad with vinegar and oil dressing.  [employer]   Coffee or tea.     \

## 2018-07-31 NOTE — Assessment & Plan Note (Signed)
SYMPTOMS NOT CONTROLLED AND LIKELY DUE TO LACTOSE INTOLERANCE, LESS LIKELY C DIFF COLITIS OR GIARDIASIS.  DRINK WATER TO KEEP YOUR URINE LIGHT YELLOW. FOLLOW A LACTOSE FREE DIET.  HANDOUT GIVEN. ADD LACTASE 3 PILLS WITH MEALS THREE TIMES A DAY. SUBMIT STOOL STUDIES. FOLLOW UP IN 6-12 MOS.

## 2018-07-31 NOTE — Progress Notes (Signed)
ON RECALL  °

## 2018-07-31 NOTE — Progress Notes (Signed)
Subjective:    Patient ID: Melissa Casey, female    DOB: 07-08-1965, 53 y.o.   MRN: [id number]  Iona Beard, MD  HPI ATE BOILED EGG AND IN 5-10 MINS SHE WAS IN THE BATHRoOM. Change in bowel habits; for past 3 weeks. Happens after she eats. AVOIDING DAIRY NT REALLY BUT BEEN OUT FOR ABOUT A MONTH. EAT CHEESE, RED VELVET CAKE,  CHEESE CAKE. LAST ATE CHEESE CAKE ON HER BIRTHDAY. NO TRAVEL. WELL WATER. NO ABX. 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 EATS FRIED SHRIMP.   PT DENIES FEVER, CHILLS, HEMATOCHEZIA, HEMATEMESIS, nausea, vomiting, melena, CHEST PAIN, SHORTNESS OF BREATH, CHANGE IN BOWEL IN HABITS, constipation, abdominal pain, problems swallowing, problems with sedation, OR heartburn or indigestion.  Past Medical History:  Diagnosis Date  . DVT (deep venous thrombosis) ([place])    remote past, over 20 years ago   . Essential hypertension, benign   . IBS (irritable bowel syndrome)   . Premature ventricular contractions (PVCs) (VPCs)    Per Dr. Berdine Addison  . Type 2 diabetes mellitus ([place])    Past Surgical History:  Procedure Laterality Date  . COLONOSCOPY N/A 07/26/2015   Procedure: COLONOSCOPY;  Surgeon: Danie Binder, MD;  [facility];  Service: Endoscopy;  Laterality: N/A;  130 - moved to 6/26 @1 :30 - office to notify pt  . ESOPHAGOGASTRODUODENOSCOPY N/A 07/26/2015   Procedure: ESOPHAGOGASTRODUODENOSCOPY (EGD);  Surgeon: Danie Binder, MD;  Location: [facility];  Service: Endoscopy;  Laterality: N/A;  . None     No Known Allergies  Current Outpatient Medications  Medication Sig    . TYLENOL 500 MG tablet Take 1,000 mg by mouth every 6 (six) hours as needed.    . NORVASC 10 MG tablet Take 10 mg by mouth daily.    . hydrochlorothiazide 25 MG tablet Take 1 tablet (25 mg total) by mouth daily.    . hydrOXYzine (VISTARIL) 25 MG capsule Take 25 mg by mouth at bedtime.    Marland Kitchen LOPRESSOR 25 MG tablet TAKE 1 TABLET BY MOUTH TWICE DAILY    .  JANUMET 50-1000 MG tablet Take 1 tablet by mouth at bedtime. INCREASED TO 1 GM FOR PAST YEAR.    Marland Kitchen telmisartan (MICARDIS) 80 MG tablet Take 80 mg by mouth daily.          Marland Kitchen losartan (COZAAR) 100 MG tablet Take 100 mg by mouth daily.     Review of Systems PER HPI OTHERWISE ALL SYSTEMS ARE NEGATIVE.    Objective:   Physical Exam Vitals signs reviewed.  Constitutional:      General: She is not in acute distress.    Appearance: She is well-developed.  HENT:     Head: Normocephalic and atraumatic.     Mouth/Throat:     Pharynx: No oropharyngeal exudate.  Eyes:     General: No scleral icterus.    Pupils: Pupils are equal, round, and reactive to light.  Neck:     Musculoskeletal: Normal range of motion and neck supple.  Cardiovascular:     Rate and Rhythm: Normal rate and regular rhythm.     Heart sounds: Normal heart sounds.  Pulmonary:     Effort: Pulmonary effort is normal. No respiratory distress.     Breath sounds: Normal breath sounds.  Abdominal:     General: Bowel sounds are normal. There is no distension.     Palpations: Abdomen is soft.  Tenderness: There is no abdominal tenderness.  Musculoskeletal:     Right lower leg: No edema.     Left lower leg: No edema.  Lymphadenopathy:     Cervical: No cervical adenopathy.  Neurological:     Mental Status: She is alert and oriented to person, place, and time.     Comments: NO  NEW FOCAL DEFICITS  Psychiatric:        Mood and Affect: Mood normal.     Comments: NORMAL AFFECT       Assessment & Plan:

## 2018-07-31 NOTE — Progress Notes (Signed)
CC'D TO PCP °

## 2018-08-22 ENCOUNTER — Other Ambulatory Visit: Payer: Self-pay | Admitting: Student

## 2018-09-10 ENCOUNTER — Other Ambulatory Visit: Payer: Self-pay | Admitting: Cardiology

## 2018-10-01 ENCOUNTER — Other Ambulatory Visit: Payer: Self-pay | Admitting: Cardiology

## 2018-10-23 ENCOUNTER — Other Ambulatory Visit: Payer: Self-pay | Admitting: Cardiology

## 2018-11-12 ENCOUNTER — Other Ambulatory Visit: Payer: Self-pay

## 2018-11-12 DIAGNOSIS — Z20822 Contact with and (suspected) exposure to covid-19: Secondary | ICD-10-CM

## 2018-11-13 LAB — NOVEL CORONAVIRUS, NAA: SARS-CoV-2, NAA: NOT DETECTED

## 2018-11-14 ENCOUNTER — Telehealth: Payer: Self-pay | Admitting: Hematology

## 2018-11-14 NOTE — Telephone Encounter (Signed)
Pt is aware covid 19 test is negative °

## 2018-12-09 ENCOUNTER — Encounter: Payer: Self-pay | Admitting: Gastroenterology

## 2019-08-31 DIAGNOSIS — Z8616 Personal history of COVID-19: Secondary | ICD-10-CM

## 2019-08-31 HISTORY — DX: Personal history of COVID-19: Z86.16

## 2019-09-23 ENCOUNTER — Other Ambulatory Visit: Payer: Self-pay

## 2019-09-23 ENCOUNTER — Emergency Department (HOSPITAL_COMMUNITY)
Admission: EM | Admit: 2019-09-23 | Discharge: 2019-09-24 | Disposition: A | Discharge status: Home / Self Care | Payer: PRIVATE HEALTH INSURANCE | Attending: Emergency Medicine | Admitting: Emergency Medicine

## 2019-09-23 ENCOUNTER — Encounter (HOSPITAL_COMMUNITY): Payer: Self-pay | Admitting: Emergency Medicine

## 2019-09-23 DIAGNOSIS — Z86718 Personal history of other venous thrombosis and embolism: Secondary | ICD-10-CM | POA: Insufficient documentation

## 2019-09-23 DIAGNOSIS — I10 Essential (primary) hypertension: Secondary | ICD-10-CM | POA: Insufficient documentation

## 2019-09-23 DIAGNOSIS — E1165 Type 2 diabetes mellitus with hyperglycemia: Secondary | ICD-10-CM | POA: Insufficient documentation

## 2019-09-23 DIAGNOSIS — Z79899 Other long term (current) drug therapy: Secondary | ICD-10-CM | POA: Insufficient documentation

## 2019-09-23 DIAGNOSIS — U071 COVID-19: Secondary | ICD-10-CM | POA: Insufficient documentation

## 2019-09-23 DIAGNOSIS — R300 Dysuria: Secondary | ICD-10-CM | POA: Insufficient documentation

## 2019-09-23 DIAGNOSIS — Z7984 Long term (current) use of oral hypoglycemic drugs: Secondary | ICD-10-CM | POA: Insufficient documentation

## 2019-09-23 DIAGNOSIS — R739 Hyperglycemia, unspecified: Secondary | ICD-10-CM

## 2019-09-23 LAB — URINALYSIS, ROUTINE W REFLEX MICROSCOPIC
Bilirubin Urine: NEGATIVE
Glucose, UA: >=500 mg/dL — AB
Ketones, ur: 20 mg/dL — AB
Nitrite: NEGATIVE
Protein, ur: 100 mg/dL — AB
Specific Gravity, Urine: 1.032 — ABNORMAL HIGH (ref 1.005–1.030)
pH: 5 (ref 5.0–8.0)

## 2019-09-23 LAB — BASIC METABOLIC PANEL
Anion gap: 14 (ref 5–15)
BUN: 14 mg/dL (ref 6–20)
CO2: 24 mmol/L (ref 22–32)
Calcium: 9.3 mg/dL (ref 8.9–10.3)
Chloride: 94 mmol/L — ABNORMAL LOW (ref 98–111)
Creatinine, Ser: 0.87 mg/dL (ref 0.44–1.00)
GFR calc Af Amer: >60 mL/min (ref >60)
GFR calc non Af Amer: >60 mL/min (ref >60)
Glucose, Bld: 416 mg/dL — ABNORMAL HIGH (ref 70–99)
Potassium: 3.5 mmol/L (ref 3.5–5.1)
Sodium: 132 mmol/L — ABNORMAL LOW (ref 135–145)

## 2019-09-23 LAB — CBC
HCT: 43 % (ref 36.0–46.0)
Hemoglobin: 13.5 g/dL (ref 12.0–15.0)
MCH: 26.4 pg (ref 26.0–34.0)
MCHC: 31.4 g/dL (ref 30.0–36.0)
MCV: 84 fL (ref 80.0–100.0)
Platelets: 203 10*3/uL (ref 150–400)
RBC: 5.12 MIL/uL — ABNORMAL HIGH (ref 3.87–5.11)
RDW: 14.3 % (ref 11.5–15.5)
WBC: 4 10*3/uL (ref 4.0–10.5)
nRBC: 0 % (ref 0.0–0.2)

## 2019-09-23 LAB — CBG MONITORING, ED
Glucose-Capillary: 347 mg/dL — ABNORMAL HIGH (ref 70–99)
Glucose-Capillary: 409 mg/dL — ABNORMAL HIGH (ref 70–99)

## 2019-09-23 LAB — SARS CORONAVIRUS 2 BY RT PCR (HOSPITAL ORDER, PERFORMED IN ~~LOC~~ HOSPITAL LAB): SARS Coronavirus 2: POSITIVE — AB

## 2019-09-23 MED ORDER — SODIUM CHLORIDE 0.9 % IV BOLUS
1000.0000 mL | Freq: Once | INTRAVENOUS | Status: AC
  Administered 2019-09-23: 1000 mL via INTRAVENOUS

## 2019-09-23 MED ORDER — ACETAMINOPHEN 325 MG PO TABS
650.0000 mg | ORAL_TABLET | Freq: Once | ORAL | Status: AC | PRN
  Administered 2019-09-23: 650 mg via ORAL
  Filled 2019-09-23: qty 2

## 2019-09-23 MED ORDER — SODIUM CHLORIDE 0.9 % IV BOLUS
500.0000 mL | Freq: Once | INTRAVENOUS | Status: AC
  Administered 2019-09-24: 500 mL via INTRAVENOUS

## 2019-09-23 MED ORDER — INSULIN ASPART 100 UNIT/ML ~~LOC~~ SOLN
8.0000 [IU] | Freq: Once | SUBCUTANEOUS | Status: AC
  Administered 2019-09-23: 8 [IU] via SUBCUTANEOUS
  Filled 2019-09-23: qty 1

## 2019-09-23 NOTE — ED Notes (Signed)
Date and time results received: 09/23/19 2354 (use smartphrase ".now" to insert current time)  Test: covid Critical Value: positive  Name of Provider Notified: Burgess Amor, pa  Orders Received? Or Actions Taken?: Actions Taken: no orders received

## 2019-09-23 NOTE — ED Triage Notes (Signed)
Pt has multiple complaints. Patient has a fever of 102.9, hyperglycemia and painfully urination with itchiness.

## 2019-09-24 ENCOUNTER — Other Ambulatory Visit: Payer: Self-pay | Admitting: Nurse Practitioner

## 2019-09-24 ENCOUNTER — Emergency Department (HOSPITAL_COMMUNITY): Payer: PRIVATE HEALTH INSURANCE

## 2019-09-24 ENCOUNTER — Other Ambulatory Visit: Payer: Self-pay | Admitting: Oncology

## 2019-09-24 DIAGNOSIS — E119 Type 2 diabetes mellitus without complications: Secondary | ICD-10-CM

## 2019-09-24 DIAGNOSIS — U071 COVID-19: Secondary | ICD-10-CM

## 2019-09-24 LAB — WET PREP, GENITAL
Clue Cells Wet Prep HPF POC: NONE SEEN
Sperm: NONE SEEN
Trich, Wet Prep: NONE SEEN
Yeast Wet Prep HPF POC: NONE SEEN

## 2019-09-24 LAB — GC/CHLAMYDIA PROBE AMP (~~LOC~~) NOT AT ARMC
Chlamydia: NEGATIVE
Comment: NEGATIVE
Comment: NORMAL
Neisseria Gonorrhea: NEGATIVE

## 2019-09-24 LAB — CBG MONITORING, ED: Glucose-Capillary: 332 mg/dL — ABNORMAL HIGH (ref 70–99)

## 2019-09-24 MED ORDER — SULFAMETHOXAZOLE-TRIMETHOPRIM 800-160 MG PO TABS: 1.0000 | ORAL_TABLET | Freq: Two times a day (BID) | ORAL | 0 refills | Status: AC

## 2019-09-24 MED ORDER — INSULIN ASPART 100 UNIT/ML ~~LOC~~ SOLN
4.0000 [IU] | Freq: Once | SUBCUTANEOUS | Status: AC
  Administered 2019-09-24: 4 [IU] via INTRAVENOUS
  Filled 2019-09-24: qty 1

## 2019-09-24 MED ORDER — SULFAMETHOXAZOLE-TRIMETHOPRIM 800-160 MG PO TABS
1.0000 | ORAL_TABLET | Freq: Once | ORAL | Status: AC
  Administered 2019-09-24: 1 via ORAL
  Filled 2019-09-24: qty 1

## 2019-09-24 NOTE — ED Provider Notes (Signed)
[facility] EMERGENCY DEPARTMENT Provider Note   CSN: [id number] Arrival date & time: 09/23/19  1853     History Chief Complaint  Patient presents with  . Hyperglycemia    Melissa Casey is a 54 y.o. female with a history of type 2 diabetes, hypertension, IBS and history of DVT presenting for evaluation of hyperglycemia and urinary complaints, stating she is urinating very frequently, sometimes with incontinence which is new for her, also describing dysuria and vaginal itching.  She denies vaginal discharge or any risk for STDs.  She was seen by her PCP yesterday who added Bystolic to her Janumet regimen.  She just got this medication today so has not started.  She reports increased thirst along with dry mouth.  Her CBG has been 600, upon calling her PCP she was advised to come here for further evaluation.  She also presented with a fever of 102.9 which started 2 days ago.  She denies nausea or vomiting, headache, shortness of breath or cough.  She also denies headache, back pain or myalgias.  She she has not been vaccinated for COVID-19.  She denies any exposures to Covid 19.    HPI     Past Medical History:  Diagnosis Date  . DVT (deep venous thrombosis) ([facility])    remote past, over 20 years ago   . Essential hypertension, benign   . IBS (irritable bowel syndrome)   . Premature ventricular contractions (PVCs) ([facility])    Per Dr. Loleta Chance  . Type 2 diabetes mellitus [facility])     Patient Active Problem List   Diagnosis Date Noted  . Diarrhea 09/21/2016  . Absolute anemia   . Anemia 06/10/2015  . Constipation 06/10/2015  . Rectal bleeding 06/10/2015  . Neck pain on left side 01/04/2015  . Chest pain 11/16/2010  . Shortness of breath 11/16/2010  . PVC's (premature ventricular contractions) 11/16/2010  . Essential hypertension, benign 11/16/2010  . Type II or unspecified type diabetes mellitus without mention of complication, uncontrolled 11/16/2010    Past Surgical History:    Procedure Laterality Date  . COLONOSCOPY N/A 07/26/2015   Procedure: COLONOSCOPY;  Surgeon: West Bali, MD;  [facility];  Service: Endoscopy;  Laterality: N/A;  130 - moved to 6/26 @1 :30 - office to notify pt  . ESOPHAGOGASTRODUODENOSCOPY N/A 07/26/2015   Procedure: ESOPHAGOGASTRODUODENOSCOPY (EGD);  Surgeon: 07/28/2015, MD;  Location: [facility];  Service: Endoscopy;  Laterality: N/A;  . None       OB History    Gravida  1   Para  1   Term  1   Preterm      AB      Living        SAB      TAB      Ectopic      Multiple      Live Births              Family History  Problem Relation Age of Onset  . Coronary artery disease Mother   . Coronary artery disease Father        Died age 41  . Hypertension Brother   . Arrhythmia Sister   . Colon cancer Neg Hx     Social History   Tobacco Use  . Smoking status: Never Smoker  . Smokeless tobacco: Never Used  Vaping Use  . Vaping Use: Never used  Substance Use Topics  . Alcohol use: No  Alcohol/week: 0.0 standard drinks  . Drug use: No    Home Medications Prior to Admission medications   Medication Sig Start Date End Date Taking? Authorizing Provider  acetaminophen (TYLENOL) 500 MG tablet Take 1,000 mg by mouth every 6 (six) hours as needed.    [provider]  amLODipine (NORVASC) 10 MG tablet Take 10 mg by mouth daily.    [provider]  hydrochlorothiazide (HYDRODIURIL) 25 MG tablet Take 1 tablet (25 mg total) by mouth daily. 01/12/17 07/31/18  Strader, Lennart Pall, PA-C  hydrOXYzine (VISTARIL) 25 MG capsule Take 25 mg by mouth at bedtime. 06/11/18   [provider]  ibuprofen (ADVIL,MOTRIN) 800 MG tablet  03/12/17   [provider]  losartan (COZAAR) 100 MG tablet Take 100 mg by mouth daily.    [provider]  metoprolol tartrate (LOPRESSOR) 25 MG tablet Take 1 tablet by mouth twice daily 10/23/18   Antoine Poche, MD   sitaGLIPtin-metformin (JANUMET) 50-1000 MG tablet Take 1 tablet by mouth at bedtime.    [provider]  sulfamethoxazole-trimethoprim (BACTRIM DS) 800-160 MG tablet Take 1 tablet by mouth 2 (two) times daily for 7 days. 09/24/19 10/01/19  Burgess Amor, PA-C  telmisartan (MICARDIS) 80 MG tablet Take 80 mg by mouth daily. 07/11/18   [provider]    Allergies    Patient has no known allergies.  Review of Systems   Review of Systems  Constitutional: Positive for fever.  HENT: Negative for congestion and sore throat.   Eyes: Negative.   Respiratory: Negative for cough, chest tightness and shortness of breath.   Cardiovascular: Negative for chest pain.  Gastrointestinal: Negative for abdominal pain, nausea and vomiting.  Endocrine: Positive for polydipsia.  Genitourinary: Positive for dysuria and urgency. Negative for hematuria and vaginal discharge.       Reports cloudy urine   Musculoskeletal: Negative for arthralgias, joint swelling and neck pain.  Skin: Negative.  Negative for rash and wound.  Neurological: Negative for dizziness, weakness, light-headedness, numbness and headaches.  Psychiatric/Behavioral: Negative.     Physical Exam Updated Vital Signs BP (!) 142/70   Pulse 85   Temp 99.8 F (37.7 C)   Resp 16   Ht 5\' 9"  (1.753 m)   Wt 113.9 kg   LMP 07/24/2015 Comment: currently experiencing cycle at this time  SpO2 95%   BMI 37.07 kg/m   Physical Exam Vitals and nursing note reviewed. Exam conducted with a chaperone present.  Constitutional:      Appearance: She is well-developed. She is obese.  HENT:     Head: Normocephalic and atraumatic.  Eyes:     Conjunctiva/sclera: Conjunctivae normal.  Cardiovascular:     Rate and Rhythm: Normal rate and regular rhythm.     Heart sounds: Normal heart sounds.  Pulmonary:     Effort: Pulmonary effort is normal.     Breath sounds: Normal breath sounds. No wheezing.  Abdominal:     General: Bowel sounds  are normal.     Palpations: Abdomen is soft.     Tenderness: There is no abdominal tenderness.  Genitourinary:    Comments: Pale vaginal mucosa, no discharge.   Musculoskeletal:        General: Normal range of motion.     Cervical back: Normal range of motion.  Skin:    General: Skin is warm and dry.  Neurological:     Mental Status: She is alert.     ED Results / Procedures /  Treatments   Labs (all labs ordered are listed, but only abnormal results are displayed) Labs Reviewed  SARS CORONAVIRUS 2 BY RT PCR (HOSPITAL ORDER, PERFORMED IN [facility]) - Abnormal; Notable for the following components:      Result Value   SARS Coronavirus 2 POSITIVE (*)    All other components within normal limits  BASIC METABOLIC PANEL - Abnormal; Notable for the following components:   Sodium 132 (*)    Chloride 94 (*)    Glucose, Bld 416 (*)    All other components within normal limits  CBC - Abnormal; Notable for the following components:   RBC 5.12 (*)    All other components within normal limits  URINALYSIS, ROUTINE W REFLEX MICROSCOPIC - Abnormal; Notable for the following components:   APPearance HAZY (*)    Specific Gravity, Urine 1.032 (*)    Glucose, UA >=500 (*)    Hgb urine dipstick MODERATE (*)    Ketones, ur 20 (*)    Protein, ur 100 (*)    Leukocytes,Ua TRACE (*)    Bacteria, UA RARE (*)    All other components within normal limits  CBG MONITORING, ED - Abnormal; Notable for the following components:   Glucose-Capillary 409 (*)    All other components within normal limits  CBG MONITORING, ED - Abnormal; Notable for the following components:   Glucose-Capillary 347 (*)    All other components within normal limits  URINE CULTURE  WET PREP, GENITAL  CBG MONITORING, ED  CBG MONITORING, ED  GC/CHLAMYDIA PROBE AMP ([place]) NOT AT [facility]    EKG None  Radiology No results found.  Procedures Procedures (including critical care time)  Medications  Ordered in ED Medications  sulfamethoxazole-trimethoprim (BACTRIM DS) 800-160 MG per tablet 1 tablet (has no administration in time range)  acetaminophen (TYLENOL) tablet 650 mg (650 mg Oral Given 09/23/19 2028)  sodium chloride 0.9 % bolus 1,000 mL (0 mLs Intravenous Stopped 09/23/19 2329)  insulin aspart (novoLOG) injection 8 Units (8 Units Subcutaneous Given 09/23/19 2235)  sodium chloride 0.9 % bolus 500 mL (500 mLs Intravenous New Bag/Given 09/24/19 0005)  insulin aspart (novoLOG) injection 4 Units (4 Units Intravenous Given 09/24/19 0010)    ED Course  I have reviewed the triage vital signs and the nursing notes.  Pertinent labs & imaging results that were available during my care of the patient were reviewed by me and considered in my medical decision making (see chart for details).    MDM Rules/Calculators/A&P                          Patient testing positive for COVID-19 today.  Reports subjective fever since Sunday.  No shortness of breath, no cough, no hypoxia here.  Main physical complaints today include dysuria along with vaginal itching, dry mouth and elevated CBG.  She was given IV fluids here along with insulin both subcu and IV and had improving CBGs.  She has no DKA, no anion gap.  Urine culture is pending.  Given her urine findings tonight and her dysuria symptoms, she was covered with antibiotics for probable UTI.  Also discussed Covid 19 positivity and that she is probably a good candidate for the MAB treatment which she is interested in completing.  The clinic was contacted per messaging.  She was stable tonight for discharge home.  We discussed need for home isolation for an additional 8 days for full 10 days  per CDC recommendations.  Strict return precautions were also discussed.  Melissa Casey was evaluated in Emergency Department on 09/24/2019 for the symptoms described in the history of present illness. She was evaluated in the context of the global COVID-19 pandemic,  which necessitated consideration that the patient might be at risk for infection with the SARS-CoV-2 virus that causes COVID-19. Institutional protocols and algorithms that pertain to the evaluation of patients at risk for COVID-19 are in a state of rapid change based on information released by regulatory bodies including the CDC and federal and state organizations. These policies and algorithms were followed during the patient's care in the ED.  Final Clinical Impression(s) / ED Diagnoses Final diagnoses:  Hyperglycemia  COVID-19  Dysuria    Rx / DC Orders ED Discharge Orders         Ordered    sulfamethoxazole-trimethoprim (BACTRIM DS) 800-160 MG tablet  2 times daily        09/24/19 0029           Burgess Amordol, Duane Earnshaw, PA-C 09/24/19 Timmie Foerster0032    Knapp, Iva, MD 09/24/19 [phone]

## 2019-09-24 NOTE — ED Provider Notes (Signed)
Patient left to get repeat CBG done around 1:15 AM and check results of chest x-ray.  CBG is 332.  DG Chest Portable 1 View  Result Date: 09/24/2019 CLINICAL DATA:  COVID pneumonia, fever EXAM: PORTABLE CHEST 1 VIEW COMPARISON:  04/07/2016 FINDINGS: The heart size and mediastinal contours are within normal limits. Both lungs are clear. The visualized skeletal structures are unremarkable. IMPRESSION: No active disease. Electronically Signed   By: Helyn Numbers MD   On: 09/24/2019 00:51      Melissa Albe, MD 09/24/19 [phone]

## 2019-09-24 NOTE — Progress Notes (Signed)
I connected by phone with Melissa Casey on 09/24/2019 at 2:58 PM to discuss the potential use of a new treatment for mild to moderate COVID-19 viral infection in non-hospitalized patients.  This patient is a 54 y.o. female that meets the FDA criteria for Emergency Use Authorization of COVID monoclonal antibody casirivimab/imdevimab.  Has a (+) direct SARS-CoV-2 viral test result  Has mild or moderate COVID-19   Is NOT hospitalized due to COVID-19  Is within 10 days of symptom onset  Has at least one of the high risk factor(s) for progression to severe COVID-19 and/or hospitalization as defined in EUA.  Specific high risk criteria : Diabetes   I have spoken and communicated the following to the patient or parent/caregiver regarding COVID monoclonal antibody treatment:  1. FDA has authorized the emergency use for the treatment of mild to moderate COVID-19 in adults and pediatric patients with positive results of direct SARS-CoV-2 viral testing who are 23 years of age and older weighing at least 40 kg, and who are at high risk for progressing to severe COVID-19 and/or hospitalization.  2. The significant known and potential risks and benefits of COVID monoclonal antibody, and the extent to which such potential risks and benefits are unknown.  3. Information on available alternative treatments and the risks and benefits of those alternatives, including clinical trials.  4. Patients treated with COVID monoclonal antibody should continue to self-isolate and use infection control measures (e.g., wear mask, isolate, social distance, avoid sharing personal items, clean and disinfect "high touch" surfaces, and frequent handwashing) according to CDC guidelines.   5. The patient or parent/caregiver has the option to accept or refuse COVID monoclonal antibody treatment.  After reviewing this information with the patient, The patient agreed to proceed with receiving casirivimab\imdevimab infusion  and will be provided a copy of the Fact sheet prior to receiving the infusion. [name] 09/24/2019 2:58 PM

## 2019-09-24 NOTE — Discharge Instructions (Addendum)
You have tested positive for COVID-19 tonight and will need to maintain home quarantine for an additional 8 days from today since your symptoms started 2 days ago.  As discussed, cones monoclonal antibody clinic should be contacting you tomorrow to arrange this treatment to help minimize the risk of you becoming more sick from Covid.  Be expecting this phone call.  You have been started on an antibiotic giving your urinary symptoms, I suspect you do have a urinary infection although your culture is pending at this time.  Take your next dose of this antibiotic tomorrow morning.  Get rechecked or return here immediately for any development of shortness of breath or severe weakness or any new concerning symptoms.  Rest to make sure you are drinking plenty of fluids to prevent dehydration.  Keep a close watch on your blood glucose levels and start taking your new medication for your diabetes as prescribed by Dr. Loleta Chance.

## 2019-09-24 NOTE — Progress Notes (Signed)
Called to Discuss with patient about Covid symptoms and the use of regeneron, a monoclonal antibody infusion for those with mild to moderate Covid symptoms and at a high risk of hospitalization.     Pt is qualified for this infusion at the [facility] infusion center due to co-morbid conditions and/or a member of an at-risk group.     Unable to reach pt. VM box is full. No my chart.   Mignon Pine AGNP-C [phone] (Infusion Center Hotline)

## 2019-09-25 ENCOUNTER — Ambulatory Visit (HOSPITAL_COMMUNITY)
Admission: RE | Admit: 2019-09-25 | Discharge: 2019-09-25 | Disposition: A | Discharge status: Home / Self Care | Payer: HRSA Program | Source: Ambulatory Visit | Attending: Pulmonary Disease | Admitting: Pulmonary Disease

## 2019-09-25 DIAGNOSIS — U071 COVID-19: Secondary | ICD-10-CM | POA: Diagnosis not present

## 2019-09-25 DIAGNOSIS — E119 Type 2 diabetes mellitus without complications: Secondary | ICD-10-CM | POA: Diagnosis present

## 2019-09-25 LAB — URINE CULTURE

## 2019-09-25 MED ORDER — FAMOTIDINE IN NACL 20-0.9 MG/50ML-% IV SOLN: 20.0000 mg | Freq: Once | INTRAVENOUS | Status: DC | PRN

## 2019-09-25 MED ORDER — EPINEPHRINE 0.3 MG/0.3ML IJ SOAJ: 0.3000 mg | Freq: Once | INTRAMUSCULAR | Status: DC | PRN

## 2019-09-25 MED ORDER — SODIUM CHLORIDE 0.9 % IV SOLN
1200.0000 mg | Freq: Once | INTRAVENOUS | Status: AC
  Administered 2019-09-25: 1200 mg via INTRAVENOUS
  Filled 2019-09-25: qty 10

## 2019-09-25 MED ORDER — SODIUM CHLORIDE 0.9 % IV SOLN: INTRAVENOUS | Status: DC | PRN

## 2019-09-25 MED ORDER — METHYLPREDNISOLONE SODIUM SUCC 125 MG IJ SOLR: 125.0000 mg | Freq: Once | INTRAMUSCULAR | Status: DC | PRN

## 2019-09-25 MED ORDER — ALBUTEROL SULFATE HFA 108 (90 BASE) MCG/ACT IN AERS: 2.0000 | INHALATION_SPRAY | Freq: Once | RESPIRATORY_TRACT | Status: DC | PRN

## 2019-09-25 MED ORDER — DIPHENHYDRAMINE HCL 50 MG/ML IJ SOLN: 50.0000 mg | Freq: Once | INTRAMUSCULAR | Status: DC | PRN

## 2019-09-25 NOTE — Discharge Instructions (Signed)

## 2019-09-25 NOTE — Progress Notes (Signed)
  Diagnosis: COVID-19  Physician: Dr. Patrick Wright  Procedure: Covid Infusion Clinic Med: casirivimab\imdevimab infusion - Provided patient with casirivimab\imdevimab fact sheet for patients, parents and caregivers prior to infusion.  Complications: No immediate complications noted.  Discharge: Discharged home   Melissa Casey 09/25/2019   

## 2019-12-24 ENCOUNTER — Encounter (HOSPITAL_COMMUNITY)
Admission: EM | Disposition: A | Discharge status: Home / Self Care | Payer: Self-pay | Source: Home / Self Care | Attending: Emergency Medicine

## 2019-12-24 ENCOUNTER — Ambulatory Visit (HOSPITAL_COMMUNITY): Payer: BC Managed Care – PPO

## 2019-12-24 ENCOUNTER — Other Ambulatory Visit: Payer: Self-pay

## 2019-12-24 ENCOUNTER — Ambulatory Visit (HOSPITAL_COMMUNITY)
Admission: EM | Admit: 2019-12-24 | Discharge: 2019-12-24 | Disposition: A | Discharge status: Home / Self Care | Payer: BC Managed Care – PPO | Attending: Emergency Medicine | Admitting: Emergency Medicine

## 2019-12-24 ENCOUNTER — Emergency Department (HOSPITAL_COMMUNITY): Payer: BC Managed Care – PPO | Admitting: Anesthesiology

## 2019-12-24 ENCOUNTER — Encounter (HOSPITAL_COMMUNITY): Payer: Self-pay | Admitting: Emergency Medicine

## 2019-12-24 ENCOUNTER — Emergency Department (HOSPITAL_COMMUNITY): Payer: BC Managed Care – PPO

## 2019-12-24 DIAGNOSIS — N132 Hydronephrosis with renal and ureteral calculous obstruction: Secondary | ICD-10-CM | POA: Diagnosis not present

## 2019-12-24 DIAGNOSIS — Z79899 Other long term (current) drug therapy: Secondary | ICD-10-CM | POA: Diagnosis not present

## 2019-12-24 DIAGNOSIS — Z20822 Contact with and (suspected) exposure to covid-19: Secondary | ICD-10-CM | POA: Diagnosis not present

## 2019-12-24 DIAGNOSIS — I700 Atherosclerosis of aorta: Secondary | ICD-10-CM | POA: Diagnosis not present

## 2019-12-24 DIAGNOSIS — Z86718 Personal history of other venous thrombosis and embolism: Secondary | ICD-10-CM | POA: Diagnosis not present

## 2019-12-24 DIAGNOSIS — N201 Calculus of ureter: Secondary | ICD-10-CM | POA: Diagnosis present

## 2019-12-24 DIAGNOSIS — Z8616 Personal history of COVID-19: Secondary | ICD-10-CM | POA: Insufficient documentation

## 2019-12-24 DIAGNOSIS — Z794 Long term (current) use of insulin: Secondary | ICD-10-CM | POA: Diagnosis not present

## 2019-12-24 HISTORY — PX: CYSTOSCOPY WITH RETROGRADE PYELOGRAM, URETEROSCOPY AND STENT PLACEMENT: SHX5789

## 2019-12-24 LAB — CBC WITH DIFFERENTIAL/PLATELET
Abs Immature Granulocytes: 0.03 10*3/uL (ref 0.00–0.07)
Basophils Absolute: 0 10*3/uL (ref 0.0–0.1)
Basophils Relative: 0 %
Eosinophils Absolute: 0 10*3/uL (ref 0.0–0.5)
Eosinophils Relative: 0 %
HCT: 39.3 % (ref 36.0–46.0)
Hemoglobin: 12.2 g/dL (ref 12.0–15.0)
Immature Granulocytes: 0 %
Lymphocytes Relative: 15 %
Lymphs Abs: 1 10*3/uL (ref 0.7–4.0)
MCH: 26.8 pg (ref 26.0–34.0)
MCHC: 31 g/dL (ref 30.0–36.0)
MCV: 86.4 fL (ref 80.0–100.0)
Monocytes Absolute: 0.5 10*3/uL (ref 0.1–1.0)
Monocytes Relative: 7 %
Neutro Abs: 5.2 10*3/uL (ref 1.7–7.7)
Neutrophils Relative %: 78 %
Platelets: 285 10*3/uL (ref 150–400)
RBC: 4.55 MIL/uL (ref 3.87–5.11)
RDW: 14.6 % (ref 11.5–15.5)
WBC: 6.8 10*3/uL (ref 4.0–10.5)
nRBC: 0 % (ref 0.0–0.2)

## 2019-12-24 LAB — URINALYSIS, ROUTINE W REFLEX MICROSCOPIC
Bilirubin Urine: NEGATIVE
Glucose, UA: NEGATIVE mg/dL
Ketones, ur: NEGATIVE mg/dL
Leukocytes,Ua: NEGATIVE
Nitrite: NEGATIVE
Protein, ur: 30 mg/dL — AB
RBC / HPF: >50 RBC/hpf — ABNORMAL HIGH (ref 0–5)
Specific Gravity, Urine: 1.018 (ref 1.005–1.030)
pH: 6 (ref 5.0–8.0)

## 2019-12-24 LAB — GLUCOSE, CAPILLARY: Glucose-Capillary: 124 mg/dL — ABNORMAL HIGH (ref 70–99)

## 2019-12-24 LAB — COMPREHENSIVE METABOLIC PANEL
ALT: 17 U/L (ref 0–44)
AST: 19 U/L (ref 15–41)
Albumin: 4 g/dL (ref 3.5–5.0)
Alkaline Phosphatase: 66 U/L (ref 38–126)
Anion gap: 9 (ref 5–15)
BUN: 15 mg/dL (ref 6–20)
CO2: 24 mmol/L (ref 22–32)
Calcium: 8.8 mg/dL — ABNORMAL LOW (ref 8.9–10.3)
Chloride: 102 mmol/L (ref 98–111)
Creatinine, Ser: 0.82 mg/dL (ref 0.44–1.00)
GFR, Estimated: >60 mL/min (ref >60)
Glucose, Bld: 127 mg/dL — ABNORMAL HIGH (ref 70–99)
Potassium: 3.5 mmol/L (ref 3.5–5.1)
Sodium: 135 mmol/L (ref 135–145)
Total Bilirubin: 0.3 mg/dL (ref 0.3–1.2)
Total Protein: 7.3 g/dL (ref 6.5–8.1)

## 2019-12-24 LAB — RESP PANEL BY RT-PCR (FLU A&B, COVID) ARPGX2
Influenza A by PCR: NEGATIVE
Influenza B by PCR: NEGATIVE
SARS Coronavirus 2 by RT PCR: NEGATIVE

## 2019-12-24 LAB — POC URINE PREG, ED: Preg Test, Ur: NEGATIVE

## 2019-12-24 SURGERY — CYSTOURETEROSCOPY, WITH RETROGRADE PYELOGRAM AND STENT INSERTION
Anesthesia: General | Laterality: Right

## 2019-12-24 MED ORDER — DIATRIZOATE MEGLUMINE 30 % UR SOLN
URETHRAL | Status: AC
  Filled 2019-12-24: qty 100

## 2019-12-24 MED ORDER — KETOROLAC TROMETHAMINE 60 MG/2ML IM SOLN
60.0000 mg | Freq: Once | INTRAMUSCULAR | Status: AC
  Administered 2019-12-24: 60 mg via INTRAMUSCULAR
  Filled 2019-12-24: qty 2

## 2019-12-24 MED ORDER — MEPERIDINE HCL 50 MG/ML IJ SOLN: 6.2500 mg | INTRAMUSCULAR | Status: DC | PRN

## 2019-12-24 MED ORDER — PROPOFOL 10 MG/ML IV BOLUS
INTRAVENOUS | Status: AC
  Filled 2019-12-24: qty 40

## 2019-12-24 MED ORDER — STERILE WATER FOR IRRIGATION IR SOLN
Status: DC | PRN
  Administered 2019-12-24: 1000 mL

## 2019-12-24 MED ORDER — TRAMADOL HCL 50 MG PO TABS
50.0000 mg | ORAL_TABLET | Freq: Once | ORAL | Status: AC
  Administered 2019-12-24: 50 mg via ORAL
  Filled 2019-12-24: qty 1

## 2019-12-24 MED ORDER — MIDAZOLAM HCL 2 MG/2ML IJ SOLN
INTRAMUSCULAR | Status: AC
  Filled 2019-12-24: qty 2

## 2019-12-24 MED ORDER — ONDANSETRON HCL 4 MG/2ML IJ SOLN
4.0000 mg | Freq: Once | INTRAMUSCULAR | Status: AC
  Administered 2019-12-24: 4 mg via INTRAVENOUS
  Filled 2019-12-24: qty 2

## 2019-12-24 MED ORDER — LACTATED RINGERS IV SOLN: INTRAVENOUS | Status: DC | PRN

## 2019-12-24 MED ORDER — METOCLOPRAMIDE HCL 5 MG/ML IJ SOLN
INTRAMUSCULAR | Status: DC | PRN
  Administered 2019-12-24: 10 mg via INTRAVENOUS

## 2019-12-24 MED ORDER — ONDANSETRON HCL 4 MG/2ML IJ SOLN
INTRAMUSCULAR | Status: AC
  Filled 2019-12-24: qty 2

## 2019-12-24 MED ORDER — CEFAZOLIN SODIUM-DEXTROSE 2-3 GM-%(50ML) IV SOLR
INTRAVENOUS | Status: DC | PRN
  Administered 2019-12-24: 2 g via INTRAVENOUS

## 2019-12-24 MED ORDER — METOCLOPRAMIDE HCL 5 MG/ML IJ SOLN
INTRAMUSCULAR | Status: AC
  Filled 2019-12-24: qty 2

## 2019-12-24 MED ORDER — FENTANYL CITRATE (PF) 100 MCG/2ML IJ SOLN
INTRAMUSCULAR | Status: DC | PRN
  Administered 2019-12-24 (×2): 50 ug via INTRAVENOUS

## 2019-12-24 MED ORDER — LIDOCAINE HCL (PF) 2 % IJ SOLN
INTRAMUSCULAR | Status: AC
  Filled 2019-12-24: qty 5

## 2019-12-24 MED ORDER — FENTANYL CITRATE (PF) 100 MCG/2ML IJ SOLN
INTRAMUSCULAR | Status: AC
  Filled 2019-12-24: qty 4

## 2019-12-24 MED ORDER — MIDAZOLAM HCL 5 MG/5ML IJ SOLN
INTRAMUSCULAR | Status: DC | PRN
  Administered 2019-12-24: 1 mg via INTRAVENOUS

## 2019-12-24 MED ORDER — SODIUM CHLORIDE 0.9 % IR SOLN
Status: DC | PRN
  Administered 2019-12-24: 3000 mL

## 2019-12-24 MED ORDER — ONDANSETRON HCL 4 MG/2ML IJ SOLN
INTRAMUSCULAR | Status: DC | PRN
  Administered 2019-12-24: 4 mg via INTRAVENOUS

## 2019-12-24 MED ORDER — PROPOFOL 10 MG/ML IV BOLUS
INTRAVENOUS | Status: DC | PRN
  Administered 2019-12-24 (×2): 50 mg via INTRAVENOUS

## 2019-12-24 MED ORDER — PROMETHAZINE HCL 25 MG/ML IJ SOLN: 6.2500 mg | INTRAMUSCULAR | Status: DC | PRN

## 2019-12-24 MED ORDER — HYDROMORPHONE HCL 1 MG/ML IJ SOLN: 0.2500 mg | INTRAMUSCULAR | Status: DC | PRN

## 2019-12-24 MED ORDER — GLYCOPYRROLATE PF 0.2 MG/ML IJ SOSY
PREFILLED_SYRINGE | INTRAMUSCULAR | Status: AC
  Filled 2019-12-24: qty 1

## 2019-12-24 MED ORDER — CEFAZOLIN SODIUM-DEXTROSE 2-4 GM/100ML-% IV SOLN
INTRAVENOUS | Status: AC
  Filled 2019-12-24: qty 100

## 2019-12-24 MED ORDER — PROPOFOL 500 MG/50ML IV EMUL
INTRAVENOUS | Status: DC | PRN
  Administered 2019-12-24: 150 ug/kg/min via INTRAVENOUS

## 2019-12-24 MED ORDER — MORPHINE SULFATE (PF) 4 MG/ML IV SOLN
4.0000 mg | Freq: Once | INTRAVENOUS | Status: AC
  Administered 2019-12-24: 4 mg via INTRAVENOUS
  Filled 2019-12-24: qty 1

## 2019-12-24 SURGICAL SUPPLY — 22 items
BAG DRAIN URO TABLE W/ADPT NS (BAG) ×2 IMPLANT
BAG DRN 8 ADPR NS SKTRN CSTL (BAG) ×1
BAG HAMPER (MISCELLANEOUS) ×2 IMPLANT
CATH URET 5FR 28IN OPEN ENDED (CATHETERS) ×2 IMPLANT
CLOTH BEACON ORANGE TIMEOUT ST (SAFETY) ×2 IMPLANT
GLOVE BIOGEL M 6.5 STRL (GLOVE) ×2 IMPLANT
GLOVE BIOGEL PI IND STRL 6.5 (GLOVE) IMPLANT
GLOVE BIOGEL PI IND STRL 7.0 (GLOVE) IMPLANT
GLOVE BIOGEL PI IND STRL 7.5 (GLOVE) IMPLANT
GLOVE BIOGEL PI INDICATOR 6.5 (GLOVE) ×4
GLOVE BIOGEL PI INDICATOR 7.0 (GLOVE) ×2
GLOVE BIOGEL PI INDICATOR 7.5 (GLOVE) ×2
GOWN STRL REUS W/ TWL XL LVL3 (GOWN DISPOSABLE) IMPLANT
GOWN STRL REUS W/TWL XL LVL3 (GOWN DISPOSABLE) ×3
GUIDEWIRE STR DUAL SENSOR (WIRE) ×2 IMPLANT
IV NS IRRIG 3000ML ARTHROMATIC (IV SOLUTION) ×2 IMPLANT
KIT TURNOVER CYSTO (KITS) ×2 IMPLANT
MANIFOLD NEPTUNE II (INSTRUMENTS) ×2 IMPLANT
PACK CYSTO (CUSTOM PROCEDURE TRAY) ×2 IMPLANT
STENT URET 6FRX26 CONTOUR (STENTS) ×2 IMPLANT
TOWEL OR 17X24 6PK STRL BLUE (TOWEL DISPOSABLE) ×2 IMPLANT
WATER STERILE IRR 500ML POUR (IV SOLUTION) ×2 IMPLANT

## 2019-12-24 NOTE — Op Note (Signed)
Preoperative diagnosis:  1. Right ureteral calculus   Postoperative diagnosis:  1. Right ureteral calculus   Procedure:  1. Cystoscopy 2. Right ureteral stent placement (6 x 24 - no string)  Surgeon: Rolly Salter, Montez Hageman. M.D.  Anesthesia: General  Complications: None  EBL: Minimal  Specimens: None  Indication: Melissa Casey is a 54 y.o. patient with an 8 mm right ureteral stone and uncontrolled pain. After reviewing the management options for treatment, he elected to proceed with the above surgical procedure(s). We have discussed the potential benefits and risks of the procedure, side effects of the proposed treatment, the likelihood of the patient achieving the goals of the procedure, and any potential problems that might occur during the procedure or recuperation. Informed consent has been obtained.  Description of procedure:  The patient was taken to the operating room and general anesthesia was induced.  The patient was placed in the dorsal lithotomy position, prepped and draped in the usual sterile fashion, and preoperative antibiotics were administered. A preoperative time-out was performed.   Cystourethroscopy was performed.  The patient's urethra was examined and was normal. The bladder was then systematically examined in its entirety. There was no evidence for any bladder tumors, stones, or other mucosal pathology.    Attention then turned to the right ureteral orifice and a ureteral catheter was used to intubate the ureteral orifice.  Omnipaque contrast was injected through the ureteral catheter and a retrograde pyelogram was performed with findings as dictated above.  A 0.38 sensor guidewire was then advanced up the right ureter into the renal pelvis under fluoroscopic guidance.  The wire was then backloaded through the cystoscope and a ureteral stent was advance over the wire using Seldinger technique.  The stent was positioned appropriately under fluoroscopic and  cystoscopic guidance.  The wire was then removed with an adequate stent curl noted in the renal pelvis as well as in the bladder.  The bladder was then emptied and the procedure ended.  The patient appeared to tolerate the procedure well and without complications.  The patient was able to be awakened and transferred to the recovery unit in satisfactory condition.    Moody Bruins MD

## 2019-12-24 NOTE — Anesthesia Postprocedure Evaluation (Signed)
Anesthesia Post Note  Patient: Melissa Casey  Procedure(s) Performed: CYSTOSCOPY WITH RETROGRADE PYELOGRAM, URETEROSCOPY AND STENT PLACEMENT (Right )  Patient location during evaluation: PACU Anesthesia Type: General Level of consciousness: awake and alert, oriented and sedated Pain management: pain level controlled Vital Signs Assessment: post-procedure vital signs reviewed and stable Respiratory status: spontaneous breathing and patient connected to nasal cannula oxygen Cardiovascular status: blood pressure returned to baseline Postop Assessment: no apparent nausea or vomiting Anesthetic complications: no   No complications documented.   Last Vitals:  Vitals:   12/24/19 2234 12/24/19 2245  BP: 122/64 132/74  Pulse: 76 70  Resp: 16 14  Temp: 36.6 C   SpO2: 98% 98%    Last Pain:  Vitals:   12/24/19 2234  TempSrc:   PainSc: 3                  Lismary Kiehn C Byron Tipping

## 2019-12-24 NOTE — ED Provider Notes (Addendum)
[facility] EMERGENCY DEPARTMENT Provider Note   CSN: [id number] Arrival date & time: 12/24/19  1219     History Chief Complaint  Patient presents with  . Flank Pain    Melissa Casey is a 54 y.o. female with PMH of remote DVT, IBS, type II DM, HTN, and recent UTI who presents the ED with complaints of right-sided flank pain.  On my examination, patient reports that she has a personal history of kidney stones.  She reports that her right-sided flank pain began 12/20/2019.  She saw her primary care provider on 12/22/2019 and her UA was suggestive of infection, but RBCs were also noted.  She was discharged home with a prescription of ciprofloxacin as well as Vicodin.  She states that she had taken ibuprofen over the weekend with some relief, however she was advised to hold off on NSAIDs given her diabetes and concern for renal impairment.  However, she does not like oral narcotic medications and feels as though the Vicodin has poorly controlled her symptoms.  She states that she has also been having increased urinary frequency, over 10 times per day.  She denies any fevers or chills, chest pain or shortness of breath, abdominal pain, hematochezia or melena, dysuria, vaginal discharge/pelvic pain, or other symptoms.  She has been mildly nauseated however, but she attributes that to the Vicodin medication.  HPI     Past Medical History:  Diagnosis Date  . DVT (deep venous thrombosis) (HCC)    remote past, over 20 years ago   . Essential hypertension, benign   . IBS (irritable bowel syndrome)   . Premature ventricular contractions (PVCs) (VPCs)    Per Dr. Loleta Chance  . Type 2 diabetes mellitus [facility])     Patient Active Problem List   Diagnosis Date Noted  . Diarrhea 09/21/2016  . Absolute anemia   . Anemia 06/10/2015  . Constipation 06/10/2015  . Rectal bleeding 06/10/2015  . Neck pain on left side 01/04/2015  . Chest pain 11/16/2010  . Shortness of breath 11/16/2010  . PVC's  (premature ventricular contractions) 11/16/2010  . Essential hypertension, benign 11/16/2010  . Type II or unspecified type diabetes mellitus without mention of complication, uncontrolled 11/16/2010    Past Surgical History:  Procedure Laterality Date  . COLONOSCOPY N/A 07/26/2015   Procedure: COLONOSCOPY;  Surgeon: West Bali, MD;  Location: [facility] ENDO SUITE;  Service: Endoscopy;  Laterality: N/A;  130 - moved to 6/26 @1 :30 - office to notify pt  . ESOPHAGOGASTRODUODENOSCOPY N/A 07/26/2015   Procedure: ESOPHAGOGASTRODUODENOSCOPY (EGD);  Surgeon: 07/28/2015, MD;  Location: [facility] ENDO SUITE;  Service: Endoscopy;  Laterality: N/A;  . None       OB History    Gravida  1   Para  1   Term  1   Preterm      AB      Living        SAB      TAB      Ectopic      Multiple      Live Births              Family History  Problem Relation Age of Onset  . Coronary artery disease Mother   . Coronary artery disease Father        Died age 66  . Hypertension Brother   . Arrhythmia Sister   . Colon cancer Neg Hx     Social History   Tobacco Use  .  Smoking status: Never Smoker  . Smokeless tobacco: Never Used  Vaping Use  . Vaping Use: Never used  Substance Use Topics  . Alcohol use: No    Alcohol/week: 0.0 standard drinks  . Drug use: No    Home Medications Prior to Admission medications   Medication Sig Start Date End Date Taking? Authorizing Provider  acetaminophen (TYLENOL) 500 MG tablet Take 1,000 mg by mouth every 6 (six) hours as needed.   Yes [provider]  amLODipine (NORVASC) 10 MG tablet Take 10 mg by mouth daily.   Yes [provider]  atorvastatin (LIPITOR) 10 MG tablet Take 10 mg by mouth daily. 12/22/19  Yes [provider]  ciprofloxacin (CIPRO) 250 MG tablet Take 250 mg by mouth 2 (two) times daily.  12/22/19  Yes [provider]  HYDROcodone-acetaminophen (NORCO/VICODIN) 5-325 MG tablet Take 1 tablet by mouth  every 6 (six) hours as needed for moderate pain.  12/22/19  Yes [provider]  ibuprofen (ADVIL,MOTRIN) 800 MG tablet 800 mg every 8 (eight) hours as needed for headache, mild pain or moderate pain.  03/12/17  Yes [provider]  sitaGLIPtin-metformin (JANUMET) 50-1000 MG tablet Take 1 tablet by mouth at bedtime.   Yes [provider]  hydrochlorothiazide (HYDRODIURIL) 25 MG tablet Take 1 tablet (25 mg total) by mouth daily. 01/12/17 07/31/18  Strader, Lennart PallBrittany M, PA-C  losartan (COZAAR) 100 MG tablet Take 100 mg by mouth daily.    [provider]  metoprolol tartrate (LOPRESSOR) 25 MG tablet Take 1 tablet by mouth twice daily Patient not taking: Reported on 12/24/2019 10/23/18   Antoine PocheBranch, Jonathan F, MD  NOVOLIN N FLEXPEN RELION 100 UNIT/ML Kiwkpen Inject into the skin. 10/04/19   [provider]    Allergies    Patient has no known allergies.  Review of Systems   Review of Systems  All other systems reviewed and are negative.   Physical Exam Updated Vital Signs BP (!) 157/86   Pulse 74   Temp 98.6 F (37 C)   Resp 20   Ht 5\' 8"  (1.727 m)   Wt 113.4 kg   LMP 07/24/2015 Comment: currently experiencing cycle at this time  SpO2 98%   BMI 38.01 kg/m   Physical Exam Vitals and nursing note reviewed. Exam conducted with a chaperone present.  Constitutional:      Appearance: She is not ill-appearing.     Comments: In discomfort.  HENT:     Head: Normocephalic and atraumatic.  Eyes:     General: No scleral icterus.    Conjunctiva/sclera: Conjunctivae normal.  Cardiovascular:     Rate and Rhythm: Normal rate.     Pulses: Normal pulses.  Pulmonary:     Effort: Pulmonary effort is normal. No respiratory distress.  Abdominal:     General: There is no distension.     Palpations: Abdomen is soft. There is no mass.     Tenderness: There is right CVA tenderness. There is no guarding.     Comments: Mild right-sided CVAT.  Negative left-sided  CVAT.  No significant abdominal tenderness.  Soft, nondistended.  No overlying skin changes.  Musculoskeletal:     Cervical back: Normal range of motion.  Skin:    General: Skin is dry.     Capillary Refill: Capillary refill takes less than 2 seconds.  Neurological:     Mental Status: She is alert and oriented to person, place, and time.     GCS: GCS eye  subscore is 4. GCS verbal subscore is 5. GCS motor subscore is 6.  Psychiatric:        Mood and Affect: Mood normal.        Behavior: Behavior normal.        Thought Content: Thought content normal.     ED Results / Procedures / Treatments   Labs (all labs ordered are listed, but only abnormal results are displayed) Labs Reviewed  URINALYSIS, ROUTINE W REFLEX MICROSCOPIC - Abnormal; Notable for the following components:      Result Value   Hgb urine dipstick MODERATE (*)    Protein, ur 30 (*)    RBC / HPF >50 (*)    Bacteria, UA RARE (*)    All other components within normal limits  COMPREHENSIVE METABOLIC PANEL - Abnormal; Notable for the following components:   Glucose, Bld 127 (*)    Calcium 8.8 (*)    All other components within normal limits  URINE CULTURE  RESP PANEL BY RT-PCR (FLU A&B, COVID) ARPGX2  CBC WITH DIFFERENTIAL/PLATELET  POC URINE PREG, ED    EKG None  Radiology CT Renal Stone Study  Result Date: 12/24/2019 CLINICAL DATA:  Right-sided flank pain EXAM: CT ABDOMEN AND PELVIS WITHOUT CONTRAST TECHNIQUE: Multidetector CT imaging of the abdomen and pelvis was performed following the standard protocol without IV contrast. COMPARISON:  CT 09/15/2007, ultrasound 08/16/2012 FINDINGS: Lower chest: Lung bases demonstrate no acute consolidation or effusion. Scattered atelectasis or scarring at the bases, lingula and right middle lobe. Hepatobiliary: Cyst in the left hepatic lobe. No calcified gallstone or biliary dilatation. Subcentimeter hypodensity in the inferior right hepatic lobe too small to further  characterize Pancreas: Unremarkable. No pancreatic ductal dilatation or surrounding inflammatory changes. Spleen: Normal in size without focal abnormality. Adrenals/Urinary Tract: Adrenal glands are normal. 3 mm stone midpole left kidney. Multiple stones in the right kidney, the largest is seen in the lower pole and measures 5 mm. Mild to moderate right hydronephrosis and proximal hydroureter, secondary to a 8 mm stone in the proximal to mid right ureter at the level of the L4-L5 intervertebral disc space. The bladder is unremarkable. Stomach/Bowel: Stomach is within normal limits. Appendix appears normal. No evidence of bowel wall thickening, distention, or inflammatory changes. Vascular/Lymphatic: Mild aortic atherosclerosis. No aneurysm. No suspicious nodes. Reproductive: Fundal enlargement of the uterus.  No adnexal mass. Other: No abdominal wall hernia or abnormality. No abdominopelvic ascites. Musculoskeletal: Degenerative changes at L5-S1. No acute osseous abnormality. IMPRESSION: 1. Mild to moderate right hydronephrosis and proximal hydroureter, secondary to a 8 mm stone in the proximal to mid right ureter at the level of the L4-L5 intervertebral disc space. 2. Bilateral intrarenal calculi. 3. Possible uterine fibroids Aortic Atherosclerosis (ICD10-I70.0). Electronically Signed   By: Jasmine Pang M.D.   On: 12/24/2019 16:31    Procedures Procedures (including critical care time)  Medications Ordered in ED Medications  ketorolac (TORADOL) injection 60 mg (60 mg Intramuscular Given 12/24/19 1558)  ondansetron (ZOFRAN) injection 4 mg (4 mg Intravenous Given 12/24/19 1729)  traMADol (ULTRAM) tablet 50 mg (50 mg Oral Given 12/24/19 1729)  morphine 4 MG/ML injection 4 mg (4 mg Intravenous Given 12/24/19 1827)    ED Course  I have reviewed the triage vital signs and the nursing notes.  Pertinent labs & imaging results that were available during my care of the patient were reviewed by me and  considered in my medical decision making (see chart for details).  Clinical Course as of  Dec 23 2104  Wed Dec 24, 2019  1759 I spoke with Dr. Laverle Patter who advised IV narcotic to see if that can get her pain under better control.  If not, he asked that I re-consult with him and then perhaps proceed to ureteral stenting.     [GG]    Clinical Course User Index [GG] Lorelee New, PA-C   MDM Rules/Calculators/A&P                          Patient reports that she has only taken ciprofloxacin for roughly 36 hours.  However, her pain symptoms are what prompted her to come to the ED for evaluation.  She does not like the way narcotic medication makes her feels and also states that it has failed to provide adequate pain control.  Will administer IM Toradol here in the ED while laboratory work-up and imaging is pending.  I reviewed her recent laboratory work-up and BMP obtained 3 months ago revealed normal creatinine and GFR.  Labs CBC with differential: No leukocytosis concerning for infection.  No anemia. CMP: Unremarkable.  No renal impairment.   Urine culture: In process. UA: Moderate hematuria, greater than 50 RBCs.  Negative nitrite and leukocytes, only 6-10 noted. Pregnancy: Negative.  Imaging I personally reviewed CT renal stone study which demonstrates mild to moderate right-sided hydronephrosis and proximal hydroureter secondary to an 8 mm obstructing stone in the proximal to mid right ureter at level of L4-L5 to space.  There are also bilateral intrarenal calculi noted.  On my subsequent evaluation, patient states that her pain improved mildly with the Toradol administration, but continues to persist.  Will provide her with Zofran and tramadol to see if that helps improve her symptoms.  If the tramadol is well tolerated with Zofran, could consider discharge.  However, patient is stating that this is the worst pain that she has ever had in the context of kidney stones and is afraid to go  home if it is not well controlled.  She states that it was 15/10 at time of arrival.  On reexamination, she has only had mild improvement with Toradol, Ultram, and Zofran.  I spoke with Dr. Laverle Patter who advised IV narcotic to see if that can get her pain under better control.  If not, he asked that I re-consult with him and then perhaps proceed to ureteral stenting.    After 4 mg morphine, patient was still not improved and complained of 8 out of 10 pain.  Reached back out to Dr. Laverle Patter, urology, who will plan for ureteral stent.  He will reach out to the OR.  Plan is for intervention tonight in effort to relieve pain and get her home.  Hospitalist admission unnecessary.   Final Clinical Impression(s) / ED Diagnoses Final diagnoses:  Ureterolithiasis    Rx / DC Orders ED Discharge Orders    None       Lorelee New, PA-C 12/24/19 2101    Lorelee New, PA-C 12/24/19 2106    Bethann Berkshire, MD 12/28/19 [phone]

## 2019-12-24 NOTE — Anesthesia Preprocedure Evaluation (Addendum)
Anesthesia Evaluation  Patient identified by MRN, date of birth, ID band Patient awake    Reviewed: Allergy & Precautions, NPO status , Patient's Chart, lab work & pertinent test results, reviewed documented beta blocker date and time   History of Anesthesia Complications Negative for: history of anesthetic complications  Airway Mallampati: II  TM Distance: >3 FB Neck ROM: Full    Dental  (+) Dental Advisory Given, Missing, Poor Dentition, Chipped   Pulmonary shortness of breath,    Pulmonary exam normal breath sounds clear to auscultation       Cardiovascular Exercise Tolerance: Good hypertension, Pt. on home beta blockers and Pt. on medications + DVT  Normal cardiovascular exam+ dysrhythmias (PVCs)  Rhythm:Regular Rate:Normal  Left ventricle: The cavity size was normal. Wall thickness was  increased in a pattern of mild LVH. Systolic function was normal.  The estimated ejection fraction was in the range of 60% to 65%.  Wall motion was normal; there were no regional wall motion  abnormalities. Left ventricular diastolic function parameters  were normal for the patient&'s age.  - Mitral valve: There was trivial regurgitation.  - Left atrium: The atrium was at the upper limits of normal in  size.  - Right atrium: Central venous pressure (est): 3 mm Hg.  - Tricuspid valve: There was trivial regurgitation.  - Pulmonary arteries: Systolic pressure could not be accurately  estimated.  - Pericardium, extracardiac: There was no pericardial effusion   Neuro/Psych negative neurological ROS  negative psych ROS   GI/Hepatic negative GI ROS, Neg liver ROS,   Endo/Other  diabetes, Well Controlled, Type 2, Oral Hypoglycemic Agents, Insulin Dependent  Renal/GU Renal disease (stones)     Musculoskeletal negative musculoskeletal ROS (+)   Abdominal   Peds  Hematology  (+) anemia ,   Anesthesia Other Findings    Reproductive/Obstetrics negative OB ROS                            Anesthesia Physical Anesthesia Plan  ASA: III and emergent  Anesthesia Plan: General   Post-op Pain Management:    Induction: Intravenous  PONV Risk Score and Plan: Ondansetron, Midazolam and Metaclopromide  Airway Management Planned: Nasal Cannula and Natural Airway  Additional Equipment:   Intra-op Plan:   Post-operative Plan:   Informed Consent: I have reviewed the patients History and Physical, chart, labs and discussed the procedure including the risks, benefits and alternatives for the proposed anesthesia with the patient or authorized representative who has indicated his/her understanding and acceptance.     Dental advisory given  Plan Discussed with: Surgeon  Anesthesia Plan Comments: (Possible GA with airway was discussed)      Anesthesia Quick Evaluation

## 2019-12-24 NOTE — ED Triage Notes (Signed)
Pt c/o of right side flank pain since 11/20. Pt was see by PCP on 11/22 and prescribed an antibiotic and pain medication.  Pt states she is being treated for a possible UTI. Prescribed medication has not given her any relief of symptoms.

## 2019-12-24 NOTE — Transfer of Care (Signed)
Immediate Anesthesia Transfer of Care Note  Patient: Melissa Casey  Procedure(s) Performed: CYSTOSCOPY WITH RETROGRADE PYELOGRAM, URETEROSCOPY AND STENT PLACEMENT (Right )  Patient Location: PACU  Anesthesia Type:General  Level of Consciousness: awake, alert , oriented and sedated  Airway & Oxygen Therapy: Patient Spontanous Breathing and Patient connected to nasal cannula oxygen  Post-op Assessment: Report given to RN and Post -op Vital signs reviewed and stable  Post vital signs: Reviewed and stable  Last Vitals:  Vitals Value Taken Time  BP 122/64 12/24/19 2234  Temp 36.6 C 12/24/19 2234  Pulse 76 12/24/19 2234  Resp 16 12/24/19 2234  SpO2 98 % 12/24/19 2234    Last Pain:  Vitals:   12/24/19 2234  TempSrc:   PainSc: 3          Complications: No complications documented.

## 2019-12-24 NOTE — Progress Notes (Signed)
I was called to admit patient with right flank pain due to ureterolithiasis. However, having discussed with the urologist, patient was going to be taken to the OR for ureteral stent and discharged home. This was already discussed with the ED PA in the presence of the urologist. Patient does not require an inpatient admission at this time. This has also been communicated with the flow manager.

## 2019-12-24 NOTE — Consult Note (Signed)
Urology Consult   Physician requesting consult: Dr. Estell Harpin  Reason for consult: Right ureteral stone and uncontrolled pain  History of Present Illness: Melissa Casey is a 54 y.o. who developed the acute onset of right sided flank pain causing her to present to the ED early this afternoon.  She did have hydrocodone at home that was not controlling her pain.  She has had associated nausea and vomiting and has been unable to tolerate much oral intake.  She has been NPO since noon except for a sip of water with her oral medication.  She has received IV toradol and morphine and still rates her pain a 7-8 out of 10.  It was felt that she would not be able to have adequate pain control with oral medication and a urology consult was obtained.  She denies fever.  She was COVID positive in late August.    She denies a history of voiding or storage urinary symptoms, hematuria, UTIs, STDs, urolithiasis, GU malignancy/trauma/surgery.  Past Medical History:  Diagnosis Date  . DVT (deep venous thrombosis) (HCC)    remote past, over 20 years ago   . Essential hypertension, benign   . IBS (irritable bowel syndrome)   . Premature ventricular contractions (PVCs) (VPCs)    Per Dr. Loleta Chance  . Type 2 diabetes mellitus (HCC)     Past Surgical History:  Procedure Laterality Date  . COLONOSCOPY N/A 07/26/2015   Procedure: COLONOSCOPY;  Surgeon: West Bali, MD;  Location: [facility];  Service: Endoscopy;  Laterality: N/A;  130 - moved to 6/26 @1 :30 - office to notify pt  . ESOPHAGOGASTRODUODENOSCOPY N/A 07/26/2015   Procedure: ESOPHAGOGASTRODUODENOSCOPY (EGD);  Surgeon: 07/28/2015, MD;  Location: [facility];  Service: Endoscopy;  Laterality: N/A;  . None       Current Hospital Medications:  Home meds:  No current facility-administered medications on file prior to encounter.   Current Outpatient Medications on File Prior to Encounter  Medication Sig Dispense Refill  . acetaminophen  (TYLENOL) 500 MG tablet Take 1,000 mg by mouth every 6 (six) hours as needed.    West Bali amLODipine (NORVASC) 10 MG tablet Take 10 mg by mouth daily.    Marland Kitchen atorvastatin (LIPITOR) 10 MG tablet Take 10 mg by mouth daily.    . ciprofloxacin (CIPRO) 250 MG tablet Take 250 mg by mouth 2 (two) times daily.     Marland Kitchen HYDROcodone-acetaminophen (NORCO/VICODIN) 5-325 MG tablet Take 1 tablet by mouth every 6 (six) hours as needed for moderate pain.     Marland Kitchen ibuprofen (ADVIL,MOTRIN) 800 MG tablet 800 mg every 8 (eight) hours as needed for headache, mild pain or moderate pain.     . sitaGLIPtin-metformin (JANUMET) 50-1000 MG tablet Take 1 tablet by mouth at bedtime.    . hydrochlorothiazide (HYDRODIURIL) 25 MG tablet Take 1 tablet (25 mg total) by mouth daily. 90 tablet 3  . losartan (COZAAR) 100 MG tablet Take 100 mg by mouth daily.    . metoprolol tartrate (LOPRESSOR) 25 MG tablet Take 1 tablet by mouth twice daily (Patient not taking: Reported on 12/24/2019) 15 tablet 0  . NOVOLIN N FLEXPEN RELION 100 UNIT/ML Kiwkpen Inject into the skin.       Scheduled Meds: Continuous Infusions: PRN Meds:.  Allergies: No Known Allergies  Family History  Problem Relation Age of Onset  . Coronary artery disease Mother   . Coronary artery disease Father        Died age 44  .  Hypertension Brother   . Arrhythmia Sister   . Colon cancer Neg Hx     Social History:  reports that she has never smoked. She has never used smokeless tobacco. She reports that she does not drink alcohol and does not use drugs.  ROS: A complete review of systems was performed.  All systems are negative except for pertinent findings as noted.  Physical Exam:  Vital signs in last 24 hours: Temp:  [98.6 F (37 C)] 98.6 F (37 C) (11/24 1306) Pulse Rate:  [70-81] 74 (11/24 1948) Resp:  [13-22] 22 (11/24 1948) BP: (145-174)/(67-88) 151/74 (11/24 1948) SpO2:  [96 %-100 %] 96 % (11/24 1948) Weight:  [113.4 kg] 113.4 kg (11/24 1308) Constitutional:   Alert and oriented, No acute distress Cardiovascular: Regular rate and rhythm, No JVD Respiratory: Normal respiratory effort GI: Abdomen is soft, obese, nondistended, no abdominal masses, moderate right upper quadrant tenderness GU: Moderate right CVA tenderness Lymphatic: No lymphadenopathy Neurologic: Grossly intact, no focal deficits Psychiatric: Normal mood and affect  Laboratory Data:  Recent Labs    12/24/19 1705  WBC 6.8  HGB 12.2  HCT 39.3  PLT 285    Recent Labs    12/24/19 1705  NA 135  K 3.5  CL 102  GLUCOSE 127*  BUN 15  CALCIUM 8.8*  CREATININE 0.82     Results for orders placed or performed during the hospital encounter of 12/24/19 (from the past 24 hour(s))  Urinalysis, Routine w reflex microscopic Urine, Clean Catch     Status: Abnormal   Collection Time: 12/24/19  3:56 PM  Result Value Ref Range   Color, Urine YELLOW YELLOW   APPearance CLEAR CLEAR   Specific Gravity, Urine 1.018 1.005 - 1.030   pH 6.0 5.0 - 8.0   Glucose, UA NEGATIVE NEGATIVE mg/dL   Hgb urine dipstick MODERATE (A) NEGATIVE   Bilirubin Urine NEGATIVE NEGATIVE   Ketones, ur NEGATIVE NEGATIVE mg/dL   Protein, ur 30 (A) NEGATIVE mg/dL   Nitrite NEGATIVE NEGATIVE   Leukocytes,Ua NEGATIVE NEGATIVE   RBC / HPF >50 (H) 0 - 5 RBC/hpf   WBC, UA 6-10 0 - 5 WBC/hpf   Bacteria, UA RARE (A) NONE SEEN   Squamous Epithelial / LPF 0-5 0 - 5   Mucus PRESENT   POC urine preg, ED     Status: None   Collection Time: 12/24/19  3:59 PM  Result Value Ref Range   Preg Test, Ur NEGATIVE NEGATIVE  Comprehensive metabolic panel     Status: Abnormal   Collection Time: 12/24/19  5:05 PM  Result Value Ref Range   Sodium 135 135 - 145 mmol/L   Potassium 3.5 3.5 - 5.1 mmol/L   Chloride 102 98 - 111 mmol/L   CO2 24 22 - 32 mmol/L   Glucose, Bld 127 (H) 70 - 99 mg/dL   BUN 15 6 - 20 mg/dL   Creatinine, Ser 2.42 0.44 - 1.00 mg/dL   Calcium 8.8 (L) 8.9 - 10.3 mg/dL   Total Protein 7.3 6.5 - 8.1 g/dL    Albumin 4.0 3.5 - 5.0 g/dL   AST 19 15 - 41 U/L   ALT 17 0 - 44 U/L   Alkaline Phosphatase 66 38 - 126 U/L   Total Bilirubin 0.3 0.3 - 1.2 mg/dL   GFR, Estimated >35 >36 mL/min   Anion gap 9 5 - 15  CBC with Differential     Status: None   Collection Time: 12/24/19  5:05 PM  Result Value Ref Range   WBC 6.8 4.0 - 10.5 K/uL   RBC 4.55 3.87 - 5.11 MIL/uL   Hemoglobin 12.2 12.0 - 15.0 g/dL   HCT 34.739.3 36 - 46 %   MCV 86.4 80.0 - 100.0 fL   MCH 26.8 26.0 - 34.0 pg   MCHC 31.0 30.0 - 36.0 g/dL   RDW 42.514.6 95.611.5 - 38.715.5 %   Platelets 285 150 - 400 K/uL   nRBC 0.0 0.0 - 0.2 %   Neutrophils Relative % 78 %   Neutro Abs 5.2 1.7 - 7.7 K/uL   Lymphocytes Relative 15 %   Lymphs Abs 1.0 0.7 - 4.0 K/uL   Monocytes Relative 7 %   Monocytes Absolute 0.5 0.1 - 1.0 K/uL   Eosinophils Relative 0 %   Eosinophils Absolute 0.0 0.0 - 0.5 K/uL   Basophils Relative 0 %   Basophils Absolute 0.0 0.0 - 0.1 K/uL   Immature Granulocytes 0 %   Abs Immature Granulocytes 0.03 0.00 - 0.07 K/uL   No results found for this or any previous visit (from the past 240 hour(s)).  Renal Function: Recent Labs    12/24/19 1705  CREATININE 0.82   Estimated Creatinine Clearance: 103.6 mL/min (by C-G formula based on SCr of 0.82 mg/dL).  Radiologic Imaging: CT Renal Stone Study  Result Date: 12/24/2019 CLINICAL DATA:  Right-sided flank pain EXAM: CT ABDOMEN AND PELVIS WITHOUT CONTRAST TECHNIQUE: Multidetector CT imaging of the abdomen and pelvis was performed following the standard protocol without IV contrast. COMPARISON:  CT 09/15/2007, ultrasound 08/16/2012 FINDINGS: Lower chest: Lung bases demonstrate no acute consolidation or effusion. Scattered atelectasis or scarring at the bases, lingula and right middle lobe. Hepatobiliary: Cyst in the left hepatic lobe. No calcified gallstone or biliary dilatation. Subcentimeter hypodensity in the inferior right hepatic lobe too small to further characterize Pancreas:  Unremarkable. No pancreatic ductal dilatation or surrounding inflammatory changes. Spleen: Normal in size without focal abnormality. Adrenals/Urinary Tract: Adrenal glands are normal. 3 mm stone midpole left kidney. Multiple stones in the right kidney, the largest is seen in the lower pole and measures 5 mm. Mild to moderate right hydronephrosis and proximal hydroureter, secondary to a 8 mm stone in the proximal to mid right ureter at the level of the L4-L5 intervertebral disc space. The bladder is unremarkable. Stomach/Bowel: Stomach is within normal limits. Appendix appears normal. No evidence of bowel wall thickening, distention, or inflammatory changes. Vascular/Lymphatic: Mild aortic atherosclerosis. No aneurysm. No suspicious nodes. Reproductive: Fundal enlargement of the uterus.  No adnexal mass. Other: No abdominal wall hernia or abnormality. No abdominopelvic ascites. Musculoskeletal: Degenerative changes at L5-S1. No acute osseous abnormality. IMPRESSION: 1. Mild to moderate right hydronephrosis and proximal hydroureter, secondary to a 8 mm stone in the proximal to mid right ureter at the level of the L4-L5 intervertebral disc space. 2. Bilateral intrarenal calculi. 3. Possible uterine fibroids Aortic Atherosclerosis (ICD10-I70.0). Electronically Signed   By: Jasmine PangKim  Fujinaga M.D.   On: 12/24/2019 16:31    I independently reviewed the above imaging studies.  Impression/Recommendation: 8 mm right ureteral stone with uncontrolled pain  - Plan for OR for cystoscopy and ureteral stent placement to provide adequate pain control.  She should be able to then be discharged home with oral pain medication (she has hydrocodone at home).  I discussed the potential benefits and risks of the procedure, side effects of the proposed treatment, the likelihood of the patient achieving the goals of the procedure,  and any potential problems that might occur during the procedure or recuperation.  She gives informed  consent to proceed.  She will then f/u as an outpatient for discussion regarding definitive stone management.  Crecencio Mc 12/24/2019, 8:09 PM  Moody Bruins. MD   CC: Dr. Estell Harpin

## 2019-12-24 NOTE — Discharge Instructions (Signed)
1. You may see some blood in the urine and may have some burning with urination for 48-72 hours. You also may notice that you have to urinate more frequently or urgently after your procedure which is normal.  2. You should call should you develop an inability urinate, fever > 101, persistent nausea and vomiting that prevents you from eating or drinking to stay hydrated.  3. If you have a stent, you will likely urinate more frequently and urgently until the stent is removed and you may experience some discomfort/pain in the lower abdomen and flank especially when urinating. You may take pain medication prescribed to you if needed for pain. You may also intermittently have blood in the urine until the stent is removed.   Moderate Conscious Sedation, Adult, Care After These instructions provide you with information about caring for yourself after your procedure. Your health care provider may also give you more specific instructions. Your treatment has been planned according to current medical practices, but problems sometimes occur. Call your health care provider if you have any problems or questions after your procedure. What can I expect after the procedure? After your procedure, it is common:  To feel sleepy for several hours.  To feel clumsy and have poor balance for several hours.  To have poor judgment for several hours.  To vomit if you eat too soon. Follow these instructions at home: For at least 24 hours after the procedure:   Do not: ? Participate in activities where you could fall or become injured. ? Drive. ? Use heavy machinery. ? Drink alcohol. ? Take sleeping pills or medicines that cause drowsiness. ? Make important decisions or sign legal documents. ? Take care of children on your own.  Rest. Eating and drinking  Follow the diet recommended by your health care provider.  If you vomit: ? Drink water, juice, or soup when you can drink without vomiting. ? Make sure you  have little or no nausea before eating solid foods. General instructions  Have a responsible adult stay with you until you are awake and alert.  Take over-the-counter and prescription medicines only as told by your health care provider.  If you smoke, do not smoke without supervision.  Keep all follow-up visits as told by your health care provider. This is important. Contact a health care provider if:  You keep feeling nauseous or you keep vomiting.  You feel light-headed.  You develop a rash.  You have a fever. Get help right away if:  You have trouble breathing. This information is not intended to replace advice given to you by your health care provider. Make sure you discuss any questions you have with your health care provider. Document Revised: 12/29/2016 Document Reviewed: 05/08/2015 Elsevier Patient Education  2020 [employer].

## 2019-12-25 LAB — GLUCOSE, CAPILLARY: Glucose-Capillary: 105 mg/dL — ABNORMAL HIGH (ref 70–99)

## 2019-12-26 LAB — URINE CULTURE

## 2019-12-26 MED ORDER — DIATRIZOATE MEGLUMINE 30 % UR SOLN
URETHRAL | Status: AC | PRN
  Administered 2019-12-24: 20 mL via URETHRAL

## 2019-12-29 ENCOUNTER — Other Ambulatory Visit: Payer: Self-pay | Admitting: Family Medicine

## 2019-12-29 ENCOUNTER — Encounter (HOSPITAL_COMMUNITY): Payer: Self-pay | Admitting: Urology

## 2020-01-09 ENCOUNTER — Other Ambulatory Visit: Payer: Self-pay | Admitting: Urology

## 2020-01-20 ENCOUNTER — Other Ambulatory Visit: Payer: Self-pay | Admitting: Urology

## 2020-01-21 ENCOUNTER — Other Ambulatory Visit: Payer: Self-pay

## 2020-01-21 ENCOUNTER — Encounter (HOSPITAL_COMMUNITY): Payer: Self-pay | Admitting: Urology

## 2020-01-21 NOTE — Progress Notes (Addendum)
COVID Vaccine Completed:n Yes Date COVID Vaccine completed: x2 COVID vaccine manufacturer: Pfizer     PCP - [name], MD Cardiologist - Dina Rich, MD.  Last OV 03-15-17  Chest x-ray - 09-24-19 in Epic EKG - 01/26/20 in epic Stress Test - Greater than 2 years ECHO - 03-23-17 in Epic Cardiac Cath - N/A Pacemaker/ICD device last checked:N/A Holter Monitor - 02-28-17 in Epic  Sleep Study -  N/A CPAP - N/A  Fasting Blood Sugar - 130's Checks Blood Sugar __1-3___ times a day  Blood Thinner Instructions: N/A Aspirin Instructions: N/A Last Dose: N/A  Anesthesia review: Hx of palpitations with nausea, PVCs, DM, HTN  Able to walk up a flight of stairs with no cardiac symptoms  Patient denies shortness of breath, fever, cough and chest pain at PAT appointment   Patient verbalized understanding of instructions that were given to them at the PAT appointment. Patient was also instructed that they will need to review over the PAT instructions again at home before surgery

## 2020-01-21 NOTE — Progress Notes (Signed)
COVID Vaccine Completed: Date COVID Vaccine completed: COVID vaccine manufacturer: [employer]   PCP - [name], MD Cardiologist - Dina Rich, MD.  Last OV 03-15-17  Chest x-ray - 09-24-19 in Epic EKG -  Stress Test -  ECHO - 03-23-17 in Epic Cardiac Cath -  Pacemaker/ICD device last checked: Holter Monitor - 02-28-17 in Epic  Sleep Study -  CPAP -   Fasting Blood Sugar -  Checks Blood Sugar _____ times a day  Blood Thinner Instructions: Aspirin Instructions: Last Dose:  Anesthesia review: Hx of palpitations with nausea, PVCs, DM, HTN  Patient denies shortness of breath, fever, cough and chest pain at PAT appointment   Patient verbalized understanding of instructions that were given to them at the PAT appointment. Patient was also instructed that they will need to review over the PAT instructions again at home before surgery.

## 2020-01-21 NOTE — Patient Instructions (Signed)
DUE TO COVID-19 ONLY ONE VISITOR IS ALLOWED TO COME WITH YOU AND STAY IN THE WAITING ROOM ONLY DURING PRE OP AND PROCEDURE.    COVID SWAB TESTING MUST BE COMPLETED ON:  Monday, Dec. 27, 2021 at 2:10PM   [address] [place], [place] [address]  (Must self quarantine after testing. Follow instructions on handout.)       Your procedure is scheduled on: Tuesday, Dec. 28, 2021   Report to [facility] Main  Entrance    Report to admitting at 7:30 AM   Call this number if you have problems the morning of surgery [phone]   Do not eat food :After Midnight.   May have liquids until 6:30AM   day of surgery  CLEAR LIQUID DIET  Foods Allowed                                                                     Foods Excluded  Water, Black Coffee and tea, regular and decaf                             liquids that you cannot  Plain Jell-O in any flavor  (No red)                                           see through such as: Fruit ices (not with fruit pulp)                                     milk, soups, orange juice              Iced Popsicles (No red)                                    All solid food                                   Apple juices Sports drinks like Gatorade (No red) Lightly seasoned clear broth or consume(fat free) Sugar, honey syrup  Sample Menu Breakfast                                Lunch                                     Supper Cranberry juice                    Beef broth                            Chicken broth Jell-O  Grape juice                           Apple juice Coffee or tea                        Jell-O                                      Popsicle                                                Coffee or tea                        Coffee or tea      Oral Hygiene is also important to reduce your risk of infection.                                    Remember - BRUSH YOUR TEETH THE MORNING OF SURGERY WITH YOUR  REGULAR TOOTHPASTE   Do NOT smoke after Midnight   Take these medicines the morning of surgery with A SIP OF WATER: Amlodipine, Metoprolol, Tamsulosin  DO NOT TAKE ANY ORAL DIABETIC MEDICATIONS DAY OF YOUR SURGERY                               You may not have any metal on your body including hair pins, jewelry, and body piercings             Do not wear make-up, lotions, powders, perfumes/cologne, or deodorant             Do not wear nail polish.  Do not shave  48 hours prior to surgery.               Do not bring valuables to the hospital. [facility] IS NOT             RESPONSIBLE   FOR VALUABLES.   Contacts, dentures or bridgework may not be worn into surgery.    Patients discharged the day of surgery will not be allowed to drive home.   Special Instructions: Bring a copy of your healthcare power of attorney and living will documents         the day of surgery if you haven't scanned them in before.              Please read over the following fact sheets you were given: IF YOU HAVE QUESTIONS ABOUT YOUR PRE OP INSTRUCTIONS PLEASE CALL [phone]   [facility] - Preparing for Surgery Before surgery, you can play an important role.  Because skin is not sterile, your skin needs to be as free of germs as possible.  You can reduce the number of germs on your skin by washing with CHG (chlorahexidine gluconate) soap before surgery.  CHG is an antiseptic cleaner which kills germs and bonds with the skin to continue killing germs even after washing. Please DO NOT use if you have an allergy to CHG or antibacterial soaps.  If your skin becomes  reddened/irritated stop using the CHG and inform your nurse when you arrive at Short Stay. Do not shave (including legs and underarms) for at least 48 hours prior to the first CHG shower.  You may shave your face/neck.  Please follow these instructions carefully:  1.  Shower with CHG Soap the night before surgery and the  morning of surgery.  2.  If you  choose to wash your hair, wash your hair first as usual with your normal  shampoo.  3.  After you shampoo, rinse your hair and body thoroughly to remove the shampoo.                             4.  Use CHG as you would any other liquid soap.  You can apply chg directly to the skin and wash.  Gently with a scrungie or clean washcloth.  5.  Apply the CHG Soap to your body ONLY FROM THE NECK DOWN.   Do   not use on face/ open                           Wound or open sores. Avoid contact with eyes, ears mouth and   genitals (private parts).                       Wash face,  Genitals (private parts) with your normal soap.             6.  Wash thoroughly, paying special attention to the area where your    surgery  will be performed.  7.  Thoroughly rinse your body with warm water from the neck down.  8.  DO NOT shower/wash with your normal soap after using and rinsing off the CHG Soap.                9.  Pat yourself dry with a clean towel.            10.  Wear clean pajamas.            11.  Place clean sheets on your bed the night of your first shower and do not  sleep with pets. Day of Surgery : Do not apply any lotions/deodorants the morning of surgery.  Please wear clean clothes to the hospital/surgery center.  FAILURE TO FOLLOW THESE INSTRUCTIONS MAY RESULT IN THE CANCELLATION OF YOUR SURGERY  PATIENT SIGNATURE_________________________________  NURSE SIGNATURE__________________________________  ________________________________________________________________________

## 2020-01-26 ENCOUNTER — Other Ambulatory Visit: Payer: Self-pay

## 2020-01-26 ENCOUNTER — Other Ambulatory Visit (HOSPITAL_COMMUNITY): Payer: BC Managed Care – PPO

## 2020-01-26 ENCOUNTER — Encounter (HOSPITAL_COMMUNITY)
Admission: RE | Admit: 2020-01-26 | Discharge: 2020-01-26 | Disposition: A | Discharge status: Home / Self Care | Payer: BC Managed Care – PPO | Source: Ambulatory Visit | Attending: Urology | Admitting: Urology

## 2020-01-26 ENCOUNTER — Other Ambulatory Visit (HOSPITAL_COMMUNITY)
Admission: RE | Admit: 2020-01-26 | Discharge: 2020-01-26 | Disposition: A | Discharge status: Home / Self Care | Payer: BC Managed Care – PPO | Source: Ambulatory Visit | Attending: Urology | Admitting: Urology

## 2020-01-26 DIAGNOSIS — Z20822 Contact with and (suspected) exposure to covid-19: Secondary | ICD-10-CM | POA: Insufficient documentation

## 2020-01-26 DIAGNOSIS — Z01818 Encounter for other preprocedural examination: Secondary | ICD-10-CM | POA: Insufficient documentation

## 2020-01-26 DIAGNOSIS — N202 Calculus of kidney with calculus of ureter: Secondary | ICD-10-CM | POA: Diagnosis not present

## 2020-01-26 DIAGNOSIS — Z7984 Long term (current) use of oral hypoglycemic drugs: Secondary | ICD-10-CM | POA: Diagnosis not present

## 2020-01-26 DIAGNOSIS — Z79899 Other long term (current) drug therapy: Secondary | ICD-10-CM | POA: Diagnosis not present

## 2020-01-26 LAB — HEMOGLOBIN A1C
Hgb A1c MFr Bld: 6.7 % — ABNORMAL HIGH (ref 4.8–5.6)
Mean Plasma Glucose: 145.59 mg/dL

## 2020-01-26 LAB — BASIC METABOLIC PANEL
Anion gap: 7 (ref 5–15)
BUN: 16 mg/dL (ref 6–20)
CO2: 27 mmol/L (ref 22–32)
Calcium: 9.1 mg/dL (ref 8.9–10.3)
Chloride: 106 mmol/L (ref 98–111)
Creatinine, Ser: 0.7 mg/dL (ref 0.44–1.00)
GFR, Estimated: >60 mL/min (ref >60)
Glucose, Bld: 164 mg/dL — ABNORMAL HIGH (ref 70–99)
Potassium: 3.8 mmol/L (ref 3.5–5.1)
Sodium: 140 mmol/L (ref 135–145)

## 2020-01-26 LAB — CBC
HCT: 35.6 % — ABNORMAL LOW (ref 36.0–46.0)
Hemoglobin: 11 g/dL — ABNORMAL LOW (ref 12.0–15.0)
MCH: 26.2 pg (ref 26.0–34.0)
MCHC: 30.9 g/dL (ref 30.0–36.0)
MCV: 84.8 fL (ref 80.0–100.0)
Platelets: 274 10*3/uL (ref 150–400)
RBC: 4.2 MIL/uL (ref 3.87–5.11)
RDW: 14.2 % (ref 11.5–15.5)
WBC: 5.3 10*3/uL (ref 4.0–10.5)
nRBC: 0 % (ref 0.0–0.2)

## 2020-01-26 LAB — GLUCOSE, CAPILLARY: Glucose-Capillary: 174 mg/dL — ABNORMAL HIGH (ref 70–99)

## 2020-01-26 NOTE — H&P (Signed)
CC/HPI: Right renal calculi   Melissa Casey is a pleasant 54 year old female with a history of hypertension and diabetes who presented with a nonobstructing 5 mm right renal calculus and an 8 mm proximal right ureteral obstructing calculus around Thanksgiving. She underwent right ureteral stent placement at [facility] for pain control. Her stone was noted to be pushed into the right kidney at the time of stent placement. After reviewing options for treatment, she has elected to proceed with ureteroscopic laser lithotripsy both to be more definitive about her treatment and limit her time out of work as well as to treat both of her stone simultaneously.   She presents today and states that she has been having worsening right lower quadrant pain. Although this may be related to her stent, she also describes her pain is sometimes given over to her left lower quadrant. She does admit that she has been constipated is not been having regular bowel movements. She has been taking hydrocodone prn. 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 She therefore is interested in moving forward with her surgery earlier than currently scheduled in order to have this done before she returns to work and in addition being able to shorten her time with her stent.     ALLERGIES: No Allergies    MEDICATIONS: Cipro 250 mg tablet  Hydrochlorothiazide 25 mg tablet  Metoprolol Tartrate 25 mg tablet  Amlodipine Besylate 10 mg tablet  Ibuprofen 800 mg tablet  Janumet 50 mg-1,000 mg tablet  Losartan Potassium 100 mg tablet     GU PSH: No GU PSH    NON-GU PSH: No Non-GU PSH    GU PMH: Renal calculus - 12/31/2019    NON-GU PMH: Arthritis Diabetes Type 2 Hypertension    FAMILY HISTORY: 1 son - Runs in Family nephrolithiasis - Runs in Family   SOCIAL HISTORY: Marital Status: Married Preferred Language: English; Ethnicity: Not Hispanic Or  Latino; Race: Black or African American    REVIEW OF SYSTEMS:    GU Review Female:   Patient denies frequent urination, hard to postpone urination, burning /pain with urination, get up at night to urinate, leakage of urine, stream starts and stops, trouble starting your stream, have to strain to urinate, and currently pregnant.  Gastrointestinal (Upper):   Patient denies nausea and vomiting.  Gastrointestinal (Lower):   Patient denies diarrhea and constipation.  Constitutional:   Patient denies fever, night sweats, weight loss, and fatigue.  Skin:   Patient denies skin rash/ lesion and itching.  Eyes:   Patient denies blurred vision and double vision.  Ears/ Nose/ Throat:   Patient denies sore throat and sinus problems.  Hematologic/Lymphatic:   Patient denies swollen glands and easy bruising.  Cardiovascular:   Patient denies leg swelling and chest pains.  Respiratory:   Patient denies cough and shortness of breath.  Endocrine:   Patient denies excessive thirst.  Musculoskeletal:   Patient denies back pain and joint pain.  Neurological:   Patient denies headaches and dizziness.  Psychologic:   Patient denies depression and anxiety.   VITAL SIGNS:      01/20/2020 12:32 PM  Weight 247 lb / 112.04 kg  Height 69 in / 175.26 cm  BP 156/81 mmHg  Pulse 74 /min  BMI 36.5 kg/m   MULTI-SYSTEM PHYSICAL EXAMINATION:    Constitutional: Well-nourished. No physical deformities. Normally developed. Good grooming.  Respiratory: No labored  breathing, no use of accessory muscles. Clear bilaterally.  Cardiovascular: Normal temperature, normal extremity pulses, no swelling, no varicosities. Regular rate and rhythm.  Gastrointestinal: No mass, no tenderness, no CVA tenderness.     Complexity of Data:  Records Review:   Previous Patient Records   PROCEDURES:          Urinalysis w/Scope Dipstick Dipstick Cont'd Micro  Color: Red Bilirubin: Neg mg/dL WBC/hpf: 0 - 5/hpf  Appearance: Cloudy Ketones:  Neg mg/dL RBC/hpf: >62/VOJ  Specific Gravity: 1.020 Blood: 3+ ery/uL Bacteria: Mod (26-50/hpf)  pH: 6.0 Protein: 2+ mg/dL Cystals: NS (Not Seen)  Glucose: Neg mg/dL Urobilinogen: 0.2 mg/dL Casts: NS (Not Seen)    Nitrites: Neg Trichomonas: Not Present    Leukocyte Esterase: 2+ leu/uL Mucous: Not Present      Epithelial Cells: 0 - 5/hpf      Yeast: Moderate (5 - 10/hpf)      Sperm: Not Present    Notes: microscopic not concentrated    ASSESSMENT:      ICD-10 Details  1 GU:   Renal calculus - N20.0    PLAN:            Medications New Meds: Tamsulosin Hcl 0.4 mg capsule 1 capsule PO Q HS   #30  0 Refill(s)            Orders Labs Urine Culture          Schedule Return Visit/Planned Activity: Other See Visit Notes             Note: Will call to schedule surgery.          Document Letter(s):  Created for Patient: Clinical Summary         Notes:   1. Right renal calculi: Her urine has been cultured today and she will be treated appropriately pending this result. She has also been provided a prescription for tamsulosin to help with stent discomfort. I did recommend that she treat her constipation as this may be the main source for her current pain symptoms. Finally, at her request, I have discussed her situation with Dr. Arita Miss he does have some operative time next week in could get her on the schedule sooner. She is interested in proceeding sooner and we will work on getting this arranged. She will be scheduled for cystoscopy and right ureteroscopic laser lithotripsy/removal to address both her stones. She understands that she will likely require a right ureteral stent temporarily following this procedure. We have discussed the potential risks, complications, and expected recovery process again in detail. She gives informed consent to proceed the in his comfortable moving forward with another surgeon.   Cc: Dr. Kasandra Knudsen

## 2020-01-27 ENCOUNTER — Ambulatory Visit (HOSPITAL_COMMUNITY)
Admission: RE | Admit: 2020-01-27 | Discharge: 2020-01-27 | Disposition: A | Discharge status: Home / Self Care | Payer: BC Managed Care – PPO | Attending: Urology | Admitting: Urology

## 2020-01-27 ENCOUNTER — Ambulatory Visit (HOSPITAL_COMMUNITY): Payer: BC Managed Care – PPO | Admitting: Anesthesiology

## 2020-01-27 ENCOUNTER — Encounter (HOSPITAL_COMMUNITY): Payer: Self-pay | Admitting: Urology

## 2020-01-27 ENCOUNTER — Encounter (HOSPITAL_COMMUNITY)
Admission: RE | Disposition: A | Discharge status: Home / Self Care | Payer: Self-pay | Source: Home / Self Care | Attending: Urology

## 2020-01-27 ENCOUNTER — Ambulatory Visit (HOSPITAL_COMMUNITY): Payer: BC Managed Care – PPO

## 2020-01-27 DIAGNOSIS — N202 Calculus of kidney with calculus of ureter: Secondary | ICD-10-CM | POA: Diagnosis not present

## 2020-01-27 DIAGNOSIS — Z20822 Contact with and (suspected) exposure to covid-19: Secondary | ICD-10-CM | POA: Insufficient documentation

## 2020-01-27 DIAGNOSIS — Z79899 Other long term (current) drug therapy: Secondary | ICD-10-CM | POA: Insufficient documentation

## 2020-01-27 DIAGNOSIS — Z7984 Long term (current) use of oral hypoglycemic drugs: Secondary | ICD-10-CM | POA: Insufficient documentation

## 2020-01-27 HISTORY — PX: CYSTOSCOPY/URETEROSCOPY/HOLMIUM LASER/STENT PLACEMENT: SHX6546

## 2020-01-27 HISTORY — DX: Personal history of urinary calculi: Z87.442

## 2020-01-27 LAB — SARS CORONAVIRUS 2 (TAT 6-24 HRS): SARS Coronavirus 2: NEGATIVE

## 2020-01-27 SURGERY — CYSTOSCOPY/URETEROSCOPY/HOLMIUM LASER/STENT PLACEMENT
Anesthesia: General | Laterality: Right

## 2020-01-27 MED ORDER — ORAL CARE MOUTH RINSE: 15.0000 mL | Freq: Once | OROMUCOSAL | Status: AC

## 2020-01-27 MED ORDER — ONDANSETRON HCL 4 MG/2ML IJ SOLN
INTRAMUSCULAR | Status: AC
  Filled 2020-01-27: qty 2

## 2020-01-27 MED ORDER — FENTANYL CITRATE (PF) 100 MCG/2ML IJ SOLN
INTRAMUSCULAR | Status: DC | PRN
  Administered 2020-01-27 (×2): 50 ug via INTRAVENOUS

## 2020-01-27 MED ORDER — OXYCODONE HCL 5 MG PO TABS
5.0000 mg | ORAL_TABLET | Freq: Once | ORAL | Status: AC | PRN
  Administered 2020-01-27: 5 mg via ORAL

## 2020-01-27 MED ORDER — EPHEDRINE 5 MG/ML INJ
INTRAVENOUS | Status: AC
  Filled 2020-01-27: qty 20

## 2020-01-27 MED ORDER — PROPOFOL 10 MG/ML IV BOLUS
INTRAVENOUS | Status: DC | PRN
  Administered 2020-01-27: 150 mg via INTRAVENOUS

## 2020-01-27 MED ORDER — HYDRALAZINE HCL 20 MG/ML IJ SOLN
INTRAMUSCULAR | Status: AC
  Filled 2020-01-27: qty 1

## 2020-01-27 MED ORDER — SODIUM CHLORIDE 0.9 % IR SOLN
Status: DC | PRN
  Administered 2020-01-27: 3000 mL

## 2020-01-27 MED ORDER — EPHEDRINE SULFATE-NACL 50-0.9 MG/10ML-% IV SOSY
PREFILLED_SYRINGE | INTRAVENOUS | Status: DC | PRN
  Administered 2020-01-27: 5 mg via INTRAVENOUS

## 2020-01-27 MED ORDER — PROMETHAZINE HCL 25 MG/ML IJ SOLN: 6.2500 mg | INTRAMUSCULAR | Status: DC | PRN

## 2020-01-27 MED ORDER — CHLORHEXIDINE GLUCONATE 0.12 % MT SOLN
15.0000 mL | Freq: Once | OROMUCOSAL | Status: AC
  Administered 2020-01-27: 08:00:00 15 mL via OROMUCOSAL

## 2020-01-27 MED ORDER — CIPROFLOXACIN HCL 500 MG PO TABS: 500.0000 mg | ORAL_TABLET | Freq: Once | ORAL | 0 refills | Status: AC

## 2020-01-27 MED ORDER — DEXAMETHASONE SODIUM PHOSPHATE 10 MG/ML IJ SOLN
INTRAMUSCULAR | Status: DC | PRN
  Administered 2020-01-27: 10 mg via INTRAVENOUS

## 2020-01-27 MED ORDER — MIDAZOLAM HCL 5 MG/5ML IJ SOLN
INTRAMUSCULAR | Status: DC | PRN
  Administered 2020-01-27: 2 mg via INTRAVENOUS

## 2020-01-27 MED ORDER — FENTANYL CITRATE (PF) 100 MCG/2ML IJ SOLN
INTRAMUSCULAR | Status: AC
  Filled 2020-01-27: qty 2

## 2020-01-27 MED ORDER — DEXAMETHASONE SODIUM PHOSPHATE 10 MG/ML IJ SOLN
INTRAMUSCULAR | Status: AC
  Filled 2020-01-27: qty 1

## 2020-01-27 MED ORDER — ONDANSETRON HCL 4 MG/2ML IJ SOLN
INTRAMUSCULAR | Status: DC | PRN
  Administered 2020-01-27: 4 mg via INTRAVENOUS

## 2020-01-27 MED ORDER — IOHEXOL 300 MG/ML  SOLN
INTRAMUSCULAR | Status: DC | PRN
  Administered 2020-01-27: 10:00:00 10 mL

## 2020-01-27 MED ORDER — LACTATED RINGERS IV SOLN: INTRAVENOUS | Status: DC | PRN

## 2020-01-27 MED ORDER — HYDROMORPHONE HCL 1 MG/ML IJ SOLN: 0.2500 mg | INTRAMUSCULAR | Status: DC | PRN

## 2020-01-27 MED ORDER — LACTATED RINGERS IV SOLN: INTRAVENOUS | Status: DC

## 2020-01-27 MED ORDER — OXYCODONE HCL 5 MG PO TABS
ORAL_TABLET | ORAL | Status: AC
  Filled 2020-01-27: qty 1

## 2020-01-27 MED ORDER — LIDOCAINE 2% (20 MG/ML) 5 ML SYRINGE
INTRAMUSCULAR | Status: DC | PRN
  Administered 2020-01-27: 60 mg via INTRAVENOUS

## 2020-01-27 MED ORDER — LIDOCAINE HCL (PF) 2 % IJ SOLN
INTRAMUSCULAR | Status: AC
  Filled 2020-01-27: qty 5

## 2020-01-27 MED ORDER — IBUPROFEN 800 MG PO TABS: 800.0000 mg | ORAL_TABLET | Freq: Three times a day (TID) | ORAL | 0 refills | Status: DC | PRN

## 2020-01-27 MED ORDER — CEFAZOLIN SODIUM-DEXTROSE 2-4 GM/100ML-% IV SOLN
2.0000 g | Freq: Once | INTRAVENOUS | Status: AC
  Administered 2020-01-27: 09:00:00 2 g via INTRAVENOUS
  Filled 2020-01-27: qty 100

## 2020-01-27 MED ORDER — ACETAMINOPHEN 500 MG PO TABS
1000.0000 mg | ORAL_TABLET | Freq: Once | ORAL | Status: AC
  Administered 2020-01-27: 08:00:00 1000 mg via ORAL
  Filled 2020-01-27: qty 2

## 2020-01-27 MED ORDER — PROPOFOL 10 MG/ML IV BOLUS
INTRAVENOUS | Status: AC
  Filled 2020-01-27: qty 20

## 2020-01-27 MED ORDER — HYDRALAZINE HCL 20 MG/ML IJ SOLN
10.0000 mg | Freq: Once | INTRAMUSCULAR | Status: AC | PRN
  Administered 2020-01-27: 10 mg via INTRAVENOUS

## 2020-01-27 MED ORDER — MIDAZOLAM HCL 2 MG/2ML IJ SOLN
INTRAMUSCULAR | Status: AC
  Filled 2020-01-27: qty 2

## 2020-01-27 MED ORDER — OXYCODONE HCL 5 MG/5ML PO SOLN: 5.0000 mg | Freq: Once | ORAL | Status: AC | PRN

## 2020-01-27 SURGICAL SUPPLY — 21 items
BAG URO CATCHER STRL LF (MISCELLANEOUS) ×3 IMPLANT
BASKET ZERO TIP NITINOL 2.4FR (BASKET) ×2 IMPLANT
BSKT STON RTRVL ZERO TP 2.4FR (BASKET) ×1
CATH URET 5FR 28IN OPEN ENDED (CATHETERS) ×3 IMPLANT
CLOTH BEACON ORANGE TIMEOUT ST (SAFETY) ×3 IMPLANT
DRSG TEGADERM 2-3/8X2-3/4 SM (GAUZE/BANDAGES/DRESSINGS) IMPLANT
EXTRACTOR STONE 1.7FRX115CM (UROLOGICAL SUPPLIES) IMPLANT
GLOVE SURG ENC MOIS LTX SZ6.5 (GLOVE) ×3 IMPLANT
GOWN STRL REUS W/TWL LRG LVL3 (GOWN DISPOSABLE) ×3 IMPLANT
GUIDEWIRE STR DUAL SENSOR (WIRE) ×5 IMPLANT
KIT TURNOVER KIT A (KITS) IMPLANT
MANIFOLD NEPTUNE II (INSTRUMENTS) ×3 IMPLANT
PACK CYSTO (CUSTOM PROCEDURE TRAY) ×3 IMPLANT
SHEATH URETERAL 12FRX28CM (UROLOGICAL SUPPLIES) ×2 IMPLANT
SHEATH URETERAL 12FRX35CM (MISCELLANEOUS) IMPLANT
STENT URET 6FRX26 CONTOUR (STENTS) ×2 IMPLANT
TRACTIP FLEXIVA PULS ID 200XHI (Laser) IMPLANT
TRACTIP FLEXIVA PULSE ID 200 (Laser) ×3
TUBING CONNECTING 10 (TUBING) ×2 IMPLANT
TUBING CONNECTING 10' (TUBING) ×1
TUBING UROLOGY SET (TUBING) ×3 IMPLANT

## 2020-01-27 NOTE — Anesthesia Procedure Notes (Signed)
Procedure Name: LMA Insertion Date/Time: 01/27/2020 8:50 AM Performed by: Lorelee Market, CRNA Pre-anesthesia Checklist: Patient identified, Emergency Drugs available, Suction available and Patient being monitored Patient Re-evaluated:Patient Re-evaluated prior to induction Oxygen Delivery Method: Circle system utilized Preoxygenation: Pre-oxygenation with 100% oxygen Induction Type: IV induction LMA: LMA inserted LMA Size: 4.0 Tube type: Oral Number of attempts: 1 Placement Confirmation: positive ETCO2 and breath sounds checked- equal and bilateral Tube secured with: Tape Dental Injury: Teeth and Oropharynx as per pre-operative assessment

## 2020-01-27 NOTE — Op Note (Signed)
Preoperative diagnosis: right ureteral and renal calculi  Postoperative diagnosis: right ureteral and renal calculi  Procedure:  1. Cystoscopy 2. Right semi-rigid and flexible ureteroscopy, laser lithotripsy and stone removal 3. right 73F x 26cm ureteral stent exchange - with tether 4. right retrograde pyelography with interpretation  Surgeon: Kasandra Knudsen, MD  Anesthesia: General  Complications: None  Intraoperative findings:   1. Right retrograde pyelogram showed no filling defects; retained contrast in upper pole calyx however it was wide open  EBL: Minimal  Specimens: 1. right ureteral and renal calculi  Disposition of specimens: [facility] for stone analysis  Indication: Melissa Casey is a 54 y.o.   patient with a 104mm proximal right ureteral stone and associated right symptoms who underwent previous stent placement.  She also had 5 mm non-obstructing right renal calculus seen on CT scan.  After reviewing the management options for treatment, the patient elected to proceed with the above surgical procedure(s). We have discussed the potential benefits and risks of the procedure, side effects of the proposed treatment, the likelihood of the patient achieving the goals of the procedure, and any potential problems that might occur during the procedure or recuperation. Informed consent has been obtained.   Description of procedure:  The patient was taken to the operating room and general anesthesia was induced.  The patient was placed in the dorsal lithotomy position, prepped and draped in the usual sterile fashion, and preoperative antibiotics were administered. A preoperative time-out was performed.   Cystourethroscopy was performed.  The patient's urethra was examined and was normal.  The bladder was then systematically examined in its entirety. There was no evidence for any bladder tumors, stones, or other mucosal pathology.    Attention then turned to  the right ureteral orifice and graspers were used to remove the existing ureteral stent.  A wire was then placed through the stent and advanced to the kidney with fluoroscopic guidance.  The stent was removed.  Next the 21 French rigid cystoscope was placed in the meatus and advanced in the bladder.  The ureteral a ureteral catheter was used to intubate the ureteral orifice.  Omnipaque contrast was injected through the ureteral catheter and a retrograde pyelogram was performed with findings as dictated above.  A 0.38 sensor guidewire was then advanced up the right ureter into the renal pelvis under fluoroscopic guidance. The 4.5 Fr semirigid ureteroscope was then advanced into the ureter next to the guidewire and the calculus was identified.   The stone was then fragmented with the 200 micron laser fiber.  All stones were then removed from the ureter with an 0 tip basket.  Reinspection of the ureter revealed no remaining visible stones or fragments.  The semirigid ureteroscope was removed.  A ureteral access sheath was then placed over the second wire and advanced to the kidney with fluoroscopic guidance.  The inner sheath and wire removed.  Flexible ureteroscopy then took place and encountered a 5 mm calculus in the mid pole.  The laser was used to fragment this and larger pieces were extracted with basket.  The ureteroscope was then removed and unison with the access sheath taking care to examine the ureter on the way out.  No residual stone fragments or injury to the ureter were noted  The wire was then backloaded through the cystoscope and a ureteral stent was advance over the wire using Seldinger technique.  The stent was positioned appropriately under fluoroscopic and cystoscopic guidance.  The wire was then  removed with an adequate stent curl noted in the renal pelvis as well as in the bladder.  The bladder was then emptied and the procedure ended.  The patient appeared to tolerate the procedure well  and without complications.  The patient was able to be awakened and transferred to the recovery unit in satisfactory condition.   Disposition: The tether of the stent was left on and tucked inside the patient's vagina.  Instructions for removing the stent have been provided to the patient.

## 2020-01-27 NOTE — Interval H&P Note (Signed)
History and Physical Interval Note:  01/27/2020 8:08 AM  Melissa Casey  has presented today for surgery, with the diagnosis of RIGHT RENAL CLACULI.  The various methods of treatment have been discussed with the patient and family. After consideration of risks, benefits and other options for treatment, the patient has consented to  Procedure(s): CYSTOSCOPY/URETEROSCOPY/HOLMIUM LASER/STENT PLACEMENT (Right) as a surgical intervention.  The patient's history has been reviewed, patient examined, no change in status, stable for surgery.  I have reviewed the patient's chart and labs.  Questions were answered to the patient's satisfaction.     Cleatus Goodin D Matix Henshaw

## 2020-01-27 NOTE — Anesthesia Preprocedure Evaluation (Addendum)
Anesthesia Evaluation  Patient identified by MRN, date of birth, ID band Patient awake    Reviewed: Allergy & Precautions, NPO status , Patient's Chart, lab work & pertinent test results  Airway Mallampati: II  TM Distance: >3 FB Neck ROM: Full    Dental  (+) Missing,    Pulmonary neg pulmonary ROS,    Pulmonary exam normal breath sounds clear to auscultation       Cardiovascular hypertension, Pt. on medications + DVT (remotely)  Normal cardiovascular exam Rhythm:Regular Rate:Normal  Echo 03/2017: - Left ventricle: The cavity size was normal. Wall thickness was  increased in a pattern of mild LVH. Systolic function was normal.  The estimated ejection fraction was in the range of 60% to 65%.  Wall motion was normal; there were no regional wall motion  abnormalities. Left ventricular diastolic function parameters  were normal for the patient&'s age.  - Mitral valve: There was trivial regurgitation.  - Left atrium: The atrium was at the upper limits of normal in  size.  - Right atrium: Central venous pressure (est): 3 mm Hg.  - Tricuspid valve: There was trivial regurgitation.  - Pulmonary arteries: Systolic pressure could not be accurately  estimated.  - Pericardium, extracardiac: There was no pericardial effusion.    Neuro/Psych negative neurological ROS  negative psych ROS   GI/Hepatic Neg liver ROS, IBS   Endo/Other  diabetes, Well Controlled, Type 2, Oral Hypoglycemic Agents, Insulin DependentObesity BMI 39 a1c 6.7  Renal/GU Renal disease (right renal calculi)  negative genitourinary   Musculoskeletal negative musculoskeletal ROS (+)   Abdominal (+) + obese,   Peds  Hematology  (+) Blood dyscrasia, anemia , hct 35.6, plt 274   Anesthesia Other Findings   Reproductive/Obstetrics negative OB ROS                           Anesthesia Physical Anesthesia Plan  ASA:  III  Anesthesia Plan: General   Post-op Pain Management:    Induction: Intravenous  PONV Risk Score and Plan: 3 and Ondansetron, Dexamethasone, Midazolam and Treatment may vary due to age or medical condition  Airway Management Planned: LMA  Additional Equipment: None  Intra-op Plan:   Post-operative Plan: Extubation in OR  Informed Consent: I have reviewed the patients History and Physical, chart, labs and discussed the procedure including the risks, benefits and alternatives for the proposed anesthesia with the patient or authorized representative who has indicated his/her understanding and acceptance.     Dental advisory given  Plan Discussed with: CRNA and Anesthesiologist  Anesthesia Plan Comments:        Anesthesia Quick Evaluation

## 2020-01-27 NOTE — Discharge Instructions (Signed)
DISCHARGE INSTRUCTIONS FOR KIDNEY STONE/URETERAL STENT   MEDICATIONS:  1.  Resume all your other meds from home  2.  Ibuprofen - you can take 800mg  every 8 hour for stent discomfort 4. Take Cipro one hour prior to removal of your stent.    ACTIVITY:  1. No strenuous activity x 1week  2. No driving while on narcotic pain medications  3. Drink plenty of water  4. Continue to walk at home - you can still get blood clots when you are at home, so keep active, but don't over do it.  5. May return to work/school tomorrow or when you feel ready   BATHING:  1. You can shower and we recommend daily showers  2. You have a string coming from your urethra: The stent string is attached to your ureteral stent. Do not pull on this.   SIGNS/SYMPTOMS TO CALL:  Please call if you have a fever greater than 101.5, uncontrolled nausea/vomiting, uncontrolled pain, dizziness, unable to urinate, bloody urine, chest pain, shortness of breath, leg swelling, leg pain, redness around wound, drainage from wound, or any other concerns or questions.   You can reach [place] at [phone].   FOLLOW-UP:  1. You have a string attached to your stent, you may remove it on Friday, 12/31. To do this, pull the string until the stent is completely removed. You may feel an odd sensation in your back.        General Anesthesia, Adult, Care After This sheet gives you information about how to care for yourself after your procedure. Your health care provider may also give you more specific instructions. If you have problems or questions, contact your health care provider. What can I expect after the procedure? After the procedure, the following side effects are common:  Pain or discomfort at the IV site.  Nausea.  Vomiting.  Sore throat.  Trouble concentrating.  Feeling cold or chills.  Weak or tired.  Sleepiness and fatigue.  Soreness and body aches. These side effects can affect parts of the body that were  not involved in surgery. Follow these instructions at home:  For at least 24 hours after the procedure:  Have a responsible adult stay with you. It is important to have someone help care for you until you are awake and alert.  Rest as needed.  Do not: ? Participate in activities in which you could fall or become injured. ? Drive. ? Use heavy machinery. ? Drink alcohol. ? Take sleeping pills or medicines that cause drowsiness. ? Make important decisions or sign legal documents. ? Take care of children on your own. Eating and drinking  Follow any instructions from your health care provider about eating or drinking restrictions.  When you feel hungry, start by eating small amounts of foods that are soft and easy to digest (bland), such as toast. Gradually return to your regular diet.  Drink enough fluid to keep your urine pale yellow.  If you vomit, rehydrate by drinking water, juice, or clear broth. General instructions  If you have sleep apnea, surgery and certain medicines can increase your risk for breathing problems. Follow instructions from your health care provider about wearing your sleep device: ? Anytime you are sleeping, including during daytime naps. ? While taking prescription pain medicines, sleeping medicines, or medicines that make you drowsy.  Return to your normal activities as told by your health care provider. Ask your health care provider what activities are safe for you.  Take over-the-counter  and prescription medicines only as told by your health care provider.  If you smoke, do not smoke without supervision.  Keep all follow-up visits as told by your health care provider. This is important. Contact a health care provider if:  You have nausea or vomiting that does not get better with medicine.  You cannot eat or drink without vomiting.  You have pain that does not get better with medicine.  You are unable to pass urine.  You develop a skin  rash.  You have a fever.  You have redness around your IV site that gets worse. Get help right away if:  You have difficulty breathing.  You have chest pain.  You have blood in your urine or stool, or you vomit blood. Summary  After the procedure, it is common to have a sore throat or nausea. It is also common to feel tired.  Have a responsible adult stay with you for the first 24 hours after general anesthesia. It is important to have someone help care for you until you are awake and alert.  When you feel hungry, start by eating small amounts of foods that are soft and easy to digest (bland), such as toast. Gradually return to your regular diet.  Drink enough fluid to keep your urine pale yellow.  Return to your normal activities as told by your health care provider. Ask your health care provider what activities are safe for you. This information is not intended to replace advice given to you by your health care provider. Make sure you discuss any questions you have with your health care provider. Document Revised: 01/19/2017 Document Reviewed: 09/01/2016 Elsevier Patient Education  2020 [employer].

## 2020-01-27 NOTE — Anesthesia Postprocedure Evaluation (Signed)
Anesthesia Post Note  Patient: Melissa Casey  Procedure(s) Performed: CYSTOSCOPY/URETEROSCOPY/HOLMIUM LASER/STENT PLACEMENT (Right )     Patient location during evaluation: PACU Anesthesia Type: General Level of consciousness: awake and alert Pain management: pain level controlled Vital Signs Assessment: post-procedure vital signs reviewed and stable Respiratory status: spontaneous breathing, nonlabored ventilation, respiratory function stable and patient connected to nasal cannula oxygen Cardiovascular status: blood pressure returned to baseline and stable Postop Assessment: no apparent nausea or vomiting Anesthetic complications: no   No complications documented.  Last Vitals:  Vitals:   01/27/20 1047 01/27/20 1102  BP: (!) 190/94 (!) 166/87  Pulse:    Resp:    Temp: (!) 36.4 C   SpO2: 100%     Last Pain:  Vitals:   01/27/20 1047  TempSrc: Oral  PainSc: 4                  Candra R Eshal Propps

## 2020-01-27 NOTE — Transfer of Care (Signed)
Immediate Anesthesia Transfer of Care Note  Patient: Denisse N Yilmaz  Procedure(s) Performed: CYSTOSCOPY/URETEROSCOPY/HOLMIUM LASER/STENT PLACEMENT (Right )  Patient Location: PACU  Anesthesia Type:General  Level of Consciousness: awake, alert , oriented and patient cooperative  Airway & Oxygen Therapy: Patient Spontanous Breathing and Patient connected to face mask oxygen  Post-op Assessment: Report given to RN, Post -op Vital signs reviewed and stable and Patient moving all extremities  Post vital signs: Reviewed and stable  Last Vitals:  Vitals Value Taken Time  BP 156/91 01/27/20 0950  Temp    Pulse 72 01/27/20 0951  Resp 14 01/27/20 0951  SpO2 100 % 01/27/20 0951  Vitals shown include unvalidated device data.  Last Pain:  Vitals:   01/27/20 0802  TempSrc:   PainSc: 0-No pain      Patients Stated Pain Goal: 3 (01/21/20 1156)  Complications: No complications documented.

## 2020-01-28 ENCOUNTER — Encounter (HOSPITAL_COMMUNITY): Payer: Self-pay | Admitting: Urology

## 2020-02-02 ENCOUNTER — Encounter (HOSPITAL_COMMUNITY): Payer: BC Managed Care – PPO

## 2020-02-09 ENCOUNTER — Encounter (HOSPITAL_COMMUNITY): Payer: BC Managed Care – PPO

## 2020-02-09 ENCOUNTER — Other Ambulatory Visit (HOSPITAL_COMMUNITY): Payer: BC Managed Care – PPO

## 2020-09-06 ENCOUNTER — Encounter: Payer: Self-pay | Admitting: Emergency Medicine

## 2020-09-06 ENCOUNTER — Ambulatory Visit
Admission: EM | Admit: 2020-09-06 | Discharge: 2020-09-06 | Disposition: A | Discharge status: Home / Self Care | Payer: BC Managed Care – PPO | Attending: Family Medicine | Admitting: Family Medicine

## 2020-09-06 ENCOUNTER — Other Ambulatory Visit: Payer: Self-pay

## 2020-09-06 DIAGNOSIS — H538 Other visual disturbances: Secondary | ICD-10-CM

## 2020-09-06 DIAGNOSIS — H5712 Ocular pain, left eye: Secondary | ICD-10-CM

## 2020-09-06 LAB — POCT FASTING CBG KUC MANUAL ENTRY: POCT Glucose (KUC): 163 mg/dL — AB (ref 70–99)

## 2020-09-06 MED ORDER — PREDNISOLONE ACETATE 1 % OP SUSP
1.0000 [drp] | Freq: Three times a day (TID) | OPHTHALMIC | 0 refills | Status: AC
Start: 2020-09-06 — End: 2020-09-09

## 2020-09-06 MED ORDER — GENTAMICIN SULFATE 0.3 % OP SOLN: 2.0000 [drp] | Freq: Two times a day (BID) | OPHTHALMIC | 0 refills | Status: AC

## 2020-09-06 NOTE — Discharge Instructions (Addendum)
Keep 2pm appointment with eye doctor. Hold picking up prescriptions I sent to pharmacy until you are evaluated by the ophthalmologist.

## 2020-09-06 NOTE — ED Provider Notes (Signed)
[facility]    CSN: [id number] Arrival date & time: 09/06/20  1001      History   Chief Complaint Chief Complaint  Patient presents with   Eye Drainage    Left     HPI Melissa Casey is a 55 y.o. female.   HPI Patient presents today with left eye pain, pressure, and scleral redness, and blurring of vision. Blurring on vision and eye pressure (left eye) began overnight, however the pain has gradually worsened over night. Patient is a diabetic and has hypertension. No known history of hypertensive eye disease or diabetic eye disease. Last eye exam over 1 year ago which she reports was normal.  Past Medical History:  Diagnosis Date   DVT (deep venous thrombosis) (HCC)    remote past, over 20 years ago, left leg   Essential hypertension, benign    History of COVID-19 08/2019   History of kidney stones    IBS (irritable bowel syndrome)    Premature ventricular contractions (PVCs) (VPCs)    Per Dr. Loleta Chance   Type 2 diabetes mellitus [facility])     Patient Active Problem List   Diagnosis Date Noted   Diarrhea 09/21/2016   Absolute anemia    Anemia 06/10/2015   Constipation 06/10/2015   Rectal bleeding 06/10/2015   Neck pain on left side 01/04/2015   Chest pain 11/16/2010   Shortness of breath 11/16/2010   PVC's (premature ventricular contractions) 11/16/2010   Essential hypertension, benign 11/16/2010   Type II or unspecified type diabetes mellitus without mention of complication, uncontrolled 11/16/2010    Past Surgical History:  Procedure Laterality Date   COLONOSCOPY N/A 07/26/2015   Procedure: COLONOSCOPY;  Surgeon: West Bali, MD;  [facility];  Service: Endoscopy;  Laterality: N/A;  130 - moved to 6/26 @1 :30 - office to notify pt   CYSTOSCOPY WITH RETROGRADE PYELOGRAM, URETEROSCOPY AND STENT PLACEMENT Right 12/24/2019   Procedure: CYSTOSCOPY WITH RETROGRADE PYELOGRAM, URETEROSCOPY AND STENT PLACEMENT;  Surgeon: 12/26/2019, MD;   Location: [facility];  Service: Urology;  Laterality: Right;   CYSTOSCOPY/URETEROSCOPY/HOLMIUM LASER/STENT PLACEMENT Right 01/27/2020   Procedure: CYSTOSCOPY/URETEROSCOPY/HOLMIUM LASER/STENT PLACEMENT;  Surgeon: 01/29/2020, MD;  Location: [facility];  Service: Urology;  Laterality: Right;   ESOPHAGOGASTRODUODENOSCOPY N/A 07/26/2015   Procedure: ESOPHAGOGASTRODUODENOSCOPY (EGD);  Surgeon: 07/28/2015, MD;  [facility];  Service: Endoscopy;  Laterality: N/A;    OB History     Gravida  1   Para  1   Term  1   Preterm      AB      Living         SAB      IAB      Ectopic      Multiple      Live Births               Home Medications    Prior to Admission medications   Medication Sig Start Date End Date Taking? Authorizing Provider  acetaminophen (TYLENOL) 500 MG tablet Take 1,000 mg by mouth every 6 (six) hours as needed (pain.).    [provider]  amLODipine (NORVASC) 10 MG tablet Take 10 mg by mouth daily.    [provider]  atorvastatin (LIPITOR) 10 MG tablet Take 10 mg by mouth every evening. 12/22/19   [provider]  ibuprofen (ADVIL) 800 MG tablet Take 1 tablet (800 mg total) by mouth every 8 (eight) hours as needed.  01/27/20   Noel Christmas, MD  ibuprofen (ADVIL,MOTRIN) 800 MG tablet Take 800 mg by mouth every 8 (eight) hours as needed for headache, mild pain or moderate pain. 03/12/17   [provider]  metoprolol tartrate (LOPRESSOR) 25 MG tablet Take 1 tablet by mouth twice daily Patient taking differently: Take 25 mg by mouth 2 (two) times daily. 10/23/18   Antoine Poche, MD  NOVOLIN N FLEXPEN RELION 100 UNIT/ML Kiwkpen Inject into the skin daily as needed (for blood sugar over 170). 10/04/19   [provider]  SitaGLIPtin-MetFORMIN HCl (JANUMET XR) [phone] MG TB24 Take 1 tablet by mouth every evening. Patient receives SAMPLES    [provider]  tamsulosin (FLOMAX) 0.4 MG CAPS  capsule Take 0.4 mg by mouth daily as needed (kidney stone). 01/20/20   [provider]  telmisartan (MICARDIS) 40 MG tablet Take 40 mg by mouth daily. 12/29/19   [provider]    Family History Family History  Problem Relation Age of Onset   Coronary artery disease Mother    Coronary artery disease Father        Died age 6   Hypertension Brother    Arrhythmia Sister    Colon cancer Neg Hx     Social History Social History   Tobacco Use   Smoking status: Never   Smokeless tobacco: Never  Vaping Use   Vaping Use: Never used  Substance Use Topics   Alcohol use: No    Alcohol/week: 0.0 standard drinks   Drug use: No     Allergies   Patient has no known allergies.   Review of Systems Review of Systems Pertinent negatives listed in HPI  Physical Exam Triage Vital Signs ED Triage Vitals  Enc Vitals Group     BP 09/06/20 1046 (!) 167/92     Pulse Rate 09/06/20 1046 69     Resp 09/06/20 1046 17     Temp 09/06/20 1046 98 F (36.7 C)     Temp src --      SpO2 09/06/20 1046 95 %     Weight --      Height --      Head Circumference --      Peak Flow --      Pain Score 09/06/20 1045 8     Pain Loc --      Pain Edu? --      Excl. in GC? --    No data found.  Updated Vital Signs BP (!) 167/92   Pulse 69   Temp 98 F (36.7 C)   Resp 17   LMP 07/24/2015 Comment: currently experiencing cycle at this time  SpO2 95%   Visual Acuity Right Eye Distance:   Left Eye Distance:   Bilateral Distance:    Right Eye Near:   Left Eye Near:    Bilateral Near:     Physical Exam Eyes:     Extraocular Movements:     Right eye: Normal extraocular motion.     Left eye: Abnormal extraocular motion and nystagmus present.     Comments: Sclerae erythematous (left) Impaired vision left eye    Cardiovascular:     Rate and Rhythm: Normal rate and regular rhythm.  Pulmonary:     Effort: Pulmonary effort is normal.     Breath sounds: Normal breath  sounds and air entry.  Neurological:     General: No focal deficit present.     Mental Status: She is  alert.     GCS: GCS eye subscore is 4. GCS verbal subscore is 5. GCS motor subscore is 6.  Psychiatric:        Attention and Perception: Attention and perception normal.        Mood and Affect: Mood normal.        Speech: Speech normal.     UC Treatments / Results  Labs (all labs ordered are listed, but only abnormal results are displayed) Labs Reviewed  POCT FASTING CBG [facility] MANUAL ENTRY - Abnormal; Notable for the following components:      Result Value   POCT Glucose ([facility]) 163 (*)    All other components within normal limits    EKG   Radiology No results found.  Procedures Procedures (including critical care time)  Medications Ordered in UC Medications - No data to display  Initial Impression / Assessment and Plan / UC Course  I have reviewed the triage vital signs and the nursing notes.  Pertinent labs & imaging results that were available during my care of the patient were reviewed by me and considered in my medical decision making (see chart for details).   Given patient is experiencing significant diminished vision, patient remained in clinic and was able to assist with securing an appointment with ophthalmologist today in [place]. Patient has a family who will transport to appointment as she is unable to see. Advised to hold off on picking up any medication until she is seen and evaluated by eye doctor. Final Clinical Impressions(s) / UC Diagnoses   Final diagnoses:  Acute left eye pain  Blurry vision, left eye     Discharge Instructions      Keep 2pm appointment with eye doctor. Hold picking up prescriptions I sent to pharmacy until you are evaluated by the ophthalmologist.       ED Prescriptions     Medication Sig Dispense Auth. Provider   gentamicin (GARAMYCIN) 0.3 % ophthalmic solution Place 2 drops into the left eye 2 (two) times daily  for 5 days. 1 mL Bing Neighbors, FNP   prednisoLONE acetate (PRED FORTE) 1 % ophthalmic suspension Place 1 drop into the left eye 3 (three) times daily for 3 days. 0.5 mL Bing Neighbors, FNP      PDMP not reviewed this encounter.   Bing Neighbors, FNP 09/12/20 2038

## 2020-09-06 NOTE — ED Triage Notes (Signed)
Pt is present today with left eye drainage and irritation. Pt states that she noticed the drainage last night and continued throughout the night. Pt states that she is also experiencing blurry vision and it feels like something is in her eye

## 2020-12-11 ENCOUNTER — Encounter: Payer: Self-pay | Admitting: Emergency Medicine

## 2020-12-11 ENCOUNTER — Ambulatory Visit
Admission: EM | Admit: 2020-12-11 | Discharge: 2020-12-11 | Disposition: A | Discharge status: Home / Self Care | Payer: BC Managed Care – PPO | Attending: Urgent Care | Admitting: Urgent Care

## 2020-12-11 ENCOUNTER — Other Ambulatory Visit: Payer: Self-pay

## 2020-12-11 DIAGNOSIS — E119 Type 2 diabetes mellitus without complications: Secondary | ICD-10-CM

## 2020-12-11 DIAGNOSIS — J069 Acute upper respiratory infection, unspecified: Secondary | ICD-10-CM | POA: Diagnosis not present

## 2020-12-11 DIAGNOSIS — R0989 Other specified symptoms and signs involving the circulatory and respiratory systems: Secondary | ICD-10-CM

## 2020-12-11 DIAGNOSIS — Z794 Long term (current) use of insulin: Secondary | ICD-10-CM

## 2020-12-11 MED ORDER — CETIRIZINE HCL 10 MG PO TABS: 10.0000 mg | ORAL_TABLET | Freq: Every day | ORAL | 0 refills | Status: DC

## 2020-12-11 MED ORDER — BENZONATATE 100 MG PO CAPS: 100.0000 mg | ORAL_CAPSULE | Freq: Three times a day (TID) | ORAL | 0 refills | Status: DC | PRN

## 2020-12-11 MED ORDER — PSEUDOEPHEDRINE HCL 30 MG PO TABS: 30.0000 mg | ORAL_TABLET | Freq: Three times a day (TID) | ORAL | 0 refills | Status: DC | PRN

## 2020-12-11 MED ORDER — PROMETHAZINE-DM 6.25-15 MG/5ML PO SYRP: 5.0000 mL | ORAL_SOLUTION | Freq: Every evening | ORAL | 0 refills | Status: DC | PRN

## 2020-12-11 NOTE — ED Provider Notes (Signed)
[facility]   MRN: [id number] DOB: 03/21/1965  Subjective:   Melissa Casey is a 55 y.o. female presenting for 2-day history of acute onset productive cough, chest congestion.  The cough does elicit some chest tightness and pain.  No history of asthma, patient is non-smoker.  She is a diabetic, treated with insulin on a sliding scale.  Denies fever, body aches, headache, throat pain.  Does not have any specific sick contacts that she knows about.  No current facility-administered medications for this encounter.  Current Outpatient Medications:    acetaminophen (TYLENOL) 500 MG tablet, Take 1,000 mg by mouth every 6 (six) hours as needed (pain.)., Disp: , Rfl:    amLODipine (NORVASC) 10 MG tablet, Take 10 mg by mouth daily., Disp: , Rfl:    atorvastatin (LIPITOR) 10 MG tablet, Take 10 mg by mouth every evening., Disp: , Rfl:    ibuprofen (ADVIL) 800 MG tablet, Take 1 tablet (800 mg total) by mouth every 8 (eight) hours as needed., Disp: 20 tablet, Rfl: 0   ibuprofen (ADVIL,MOTRIN) 800 MG tablet, Take 800 mg by mouth every 8 (eight) hours as needed for headache, mild pain or moderate pain., Disp: , Rfl:    metoprolol tartrate (LOPRESSOR) 25 MG tablet, Take 1 tablet by mouth twice daily (Patient taking differently: Take 25 mg by mouth 2 (two) times daily.), Disp: 15 tablet, Rfl: 0   NOVOLIN N FLEXPEN RELION 100 UNIT/ML Kiwkpen, Inject into the skin daily as needed (for blood sugar over 170)., Disp: , Rfl:    SitaGLIPtin-MetFORMIN HCl (JANUMET XR) [phone] MG TB24, Take 1 tablet by mouth every evening. Patient receives SAMPLES, Disp: , Rfl:    tamsulosin (FLOMAX) 0.4 MG CAPS capsule, Take 0.4 mg by mouth daily as needed (kidney stone)., Disp: , Rfl:    telmisartan (MICARDIS) 40 MG tablet, Take 40 mg by mouth daily., Disp: , Rfl:   Facility-Administered Medications Ordered in Other Encounters:    diatrizoate meglumine (CYSTOGRAFIN/HYPAQUE) 30 % solution, , , PRN, Heloise Purpura, MD, 20 mL at 12/24/19 2143   No Known Allergies  Past Medical History:  Diagnosis Date   DVT (deep venous thrombosis) (HCC)    remote past, over 20 years ago, left leg   Essential hypertension, benign    History of COVID-19 08/2019   History of kidney stones    IBS (irritable bowel syndrome)    Premature ventricular contractions (PVCs) (VPCs)    Per Dr. Loleta Chance   Type 2 diabetes mellitus [facility])      Past Surgical History:  Procedure Laterality Date   COLONOSCOPY N/A 07/26/2015   Procedure: COLONOSCOPY;  Surgeon: West Bali, MD;  [facility];  Service: Endoscopy;  Laterality: N/A;  130 - moved to 6/26 @1 :30 - office to notify pt   CYSTOSCOPY WITH RETROGRADE PYELOGRAM, URETEROSCOPY AND STENT PLACEMENT Right 12/24/2019   Procedure: CYSTOSCOPY WITH RETROGRADE PYELOGRAM, URETEROSCOPY AND STENT PLACEMENT;  Surgeon: 12/26/2019, MD;  Location: [facility];  Service: Urology;  Laterality: Right;   CYSTOSCOPY/URETEROSCOPY/HOLMIUM LASER/STENT PLACEMENT Right 01/27/2020   Procedure: CYSTOSCOPY/URETEROSCOPY/HOLMIUM LASER/STENT PLACEMENT;  Surgeon: 01/29/2020, MD;  Location[facility];  Service: Urology;  Laterality: Right;   ESOPHAGOGASTRODUODENOSCOPY N/A 07/26/2015   Procedure: ESOPHAGOGASTRODUODENOSCOPY (EGD);  Surgeon: 07/28/2015, MD;  [facility];  Service: Endoscopy;  Laterality: N/A;    Family History  Problem Relation Age of Onset   Coronary artery disease Mother    Coronary artery disease Father  Died age 40   Hypertension Brother    Arrhythmia Sister    Colon cancer Neg Hx     Social History   Tobacco Use   Smoking status: Never   Smokeless tobacco: Never  Vaping Use   Vaping Use: Never used  Substance Use Topics   Alcohol use: No    Alcohol/week: 0.0 standard drinks   Drug use: No    ROS   Objective:   Vitals: BP (!) 177/92 (BP Location: Right Arm)   Pulse 80   Temp 98.6 F (37 C) (Oral)   Resp 14   LMP 07/24/2015  Comment: currently experiencing cycle at this time  SpO2 95%   Physical Exam Constitutional:      General: She is not in acute distress.    Appearance: Normal appearance. She is well-developed. She is not ill-appearing, toxic-appearing or diaphoretic.  HENT:     Head: Normocephalic and atraumatic.     Right Ear: Tympanic membrane, ear canal and external ear normal. No drainage or tenderness. No middle ear effusion. Tympanic membrane is not erythematous.     Left Ear: Tympanic membrane, ear canal and external ear normal. No drainage or tenderness.  No middle ear effusion. Tympanic membrane is not erythematous.     Nose: Nose normal. No congestion or rhinorrhea.     Mouth/Throat:     Mouth: Mucous membranes are moist. No oral lesions.     Pharynx: No pharyngeal swelling, oropharyngeal exudate, posterior oropharyngeal erythema or uvula swelling.     Tonsils: No tonsillar exudate or tonsillar abscesses.  Eyes:     General: No scleral icterus.       Right eye: No discharge.        Left eye: No discharge.     Extraocular Movements: Extraocular movements intact.     Right eye: Normal extraocular motion.     Left eye: Normal extraocular motion.     Pupils: Pupils are equal, round, and reactive to light.  Cardiovascular:     Rate and Rhythm: Normal rate and regular rhythm.     Pulses: Normal pulses.     Heart sounds: Normal heart sounds. No murmur heard.   No friction rub. No gallop.  Pulmonary:     Effort: Pulmonary effort is normal. No respiratory distress.     Breath sounds: Normal breath sounds. No stridor. No wheezing, rhonchi or rales.  Musculoskeletal:     Cervical back: Normal range of motion and neck supple.  Lymphadenopathy:     Cervical: No cervical adenopathy.  Skin:    General: Skin is warm and dry.     Findings: No rash.  Neurological:     General: No focal deficit present.     Mental Status: She is alert and oriented to person, place, and time.  Psychiatric:         Mood and Affect: Mood normal.        Behavior: Behavior normal.        Thought Content: Thought content normal.        Judgment: Judgment normal.    Assessment and Plan :   PDMP not reviewed this encounter.  1. Viral URI with cough   2. Chest congestion   3. Type 2 diabetes mellitus treated with insulin [facility])    Deferred imaging given clear cardiopulmonary exam, hemodynamically stable vital signs. COVID and flu test pending.  We will otherwise manage for viral upper respiratory infection.  Physical exam findings reassuring and vital signs  stable for discharge. Advised supportive care, offered symptomatic relief. Counseled patient on potential for adverse effects with medications prescribed/recommended today, ER and return-to-clinic precautions discussed, patient verbalized understanding.      Wallis Bamberg, PA-C 12/11/20 1445

## 2020-12-11 NOTE — ED Triage Notes (Signed)
Pt presents today with c/o of chest congestion with cough x 2 days. Denies fever.

## 2020-12-11 NOTE — Discharge Instructions (Signed)
We will notify you of your test results as they arrive and may take between 48-72 hours.  I encourage you to sign up for MyChart if you have not already done so as this can be the easiest way for us to communicate results to you online or through a phone app.  Generally, we only contact you if it is a positive test result.  In the meantime, if you develop worsening symptoms including fever, chest pain, shortness of breath despite our current treatment plan then please report to the emergency room as this may be a sign of worsening status from possible viral infection.  Otherwise, we will manage this as a viral syndrome. For sore throat or cough try using a honey-based tea. Use 3 teaspoons of honey with juice squeezed from half lemon. Place shaved pieces of ginger into 1/2-1 cup of water and warm over stove top. Then mix the ingredients and repeat every 4 hours as needed. Please take Tylenol 500mg-650mg every 6 hours for aches and pains, fevers. Hydrate very well with at least 2 liters of water. Eat light meals such as soups to replenish electrolytes and soft fruits, veggies. Start an antihistamine like Zyrtec for postnasal drainage, sinus congestion.  You can take this together with pseudoephedrine (Sudafed) at a dose of 30 mg 2-3 times a day as needed for the same kind of congestion.    

## 2020-12-12 LAB — COVID-19, FLU A+B NAA
Influenza A, NAA: NOT DETECTED
Influenza B, NAA: NOT DETECTED
SARS-CoV-2, NAA: NOT DETECTED

## 2020-12-13 ENCOUNTER — Telehealth: Payer: Self-pay | Admitting: Family Medicine

## 2020-12-13 NOTE — Telephone Encounter (Signed)
Patient requesting work note, work note generated.

## 2021-02-14 ENCOUNTER — Emergency Department (HOSPITAL_COMMUNITY): Payer: BC Managed Care – PPO

## 2021-02-14 ENCOUNTER — Other Ambulatory Visit: Payer: Self-pay

## 2021-02-14 ENCOUNTER — Emergency Department (HOSPITAL_COMMUNITY)
Admission: EM | Admit: 2021-02-14 | Discharge: 2021-02-14 | Disposition: A | Discharge status: Home / Self Care | Payer: BC Managed Care – PPO | Attending: Emergency Medicine | Admitting: Emergency Medicine

## 2021-02-14 ENCOUNTER — Encounter (HOSPITAL_COMMUNITY): Payer: Self-pay | Admitting: *Deleted

## 2021-02-14 DIAGNOSIS — U071 COVID-19: Secondary | ICD-10-CM | POA: Diagnosis not present

## 2021-02-14 DIAGNOSIS — R509 Fever, unspecified: Secondary | ICD-10-CM | POA: Diagnosis present

## 2021-02-14 LAB — RESP PANEL BY RT-PCR (FLU A&B, COVID) ARPGX2
Influenza A by PCR: NEGATIVE
Influenza B by PCR: NEGATIVE
SARS Coronavirus 2 by RT PCR: POSITIVE — AB

## 2021-02-14 LAB — I-STAT CREATININE, ED: Creatinine, Ser: 0.8 mg/dL (ref 0.44–1.00)

## 2021-02-14 MED ORDER — PROMETHAZINE-DM 6.25-15 MG/5ML PO SYRP: 5.0000 mL | ORAL_SOLUTION | Freq: Four times a day (QID) | ORAL | 0 refills | Status: DC | PRN

## 2021-02-14 MED ORDER — BENZONATATE 100 MG PO CAPS: 200.0000 mg | ORAL_CAPSULE | Freq: Three times a day (TID) | ORAL | 0 refills | Status: DC | PRN

## 2021-02-14 MED ORDER — NIRMATRELVIR/RITONAVIR (PAXLOVID)TABLET: 3.0000 | ORAL_TABLET | Freq: Two times a day (BID) | ORAL | 0 refills | Status: AC

## 2021-02-14 NOTE — ED Triage Notes (Signed)
Pt c/o chest congestion and cough; pt states she took a home covid test x 4 days ago and it was positive

## 2021-02-14 NOTE — Discharge Instructions (Addendum)
Rest make sure you are drinking plenty of fluids.  You are a candidate for an antiviral medication called Paxlovid which may help you get over this infection easier and quicker.  Get rechecked for any worsening symptoms including weakness or shortness of breath.  Your lab test of your kidney function and your chest x-ray today are reassuring.  You have tested positive for COVID-19, but remember that our test can be positive for up to 3 months after you have had active COVID as this is an antibody test.  Your quarantine period should be up starting Thursday as well as you are feeling improved you can return to work on this day.

## 2021-02-17 NOTE — ED Provider Notes (Signed)
[facility] EMERGENCY DEPARTMENT Provider Note   CSN: [id number] Arrival date & time: 02/14/21  1104     History  Chief Complaint  Patient presents with   Cough    Melissa Casey is a 56 y.o. female who tested positive for Covid per home covid testing 4 days ago, presenting with persistent cough.  She reports initially having upper nasal congestion with clear rhinorrhea, myalgia and subjective fever, but the congestion appears to have moved to her chest.  She endorses occasional expectoration of clear sputum.  She denies sob, persistent fever, does continue to have some achiness and of course persistent cough.  She was prescribed promethazine-dextromethorphan cough syrup with a prior cough illness and was using this until she ran out, stating it was helpful.  She has had no other treatments prior to arrival.  The history is provided by the patient.      Home Medications Prior to Admission medications   Medication Sig Start Date End Date Taking? Authorizing Provider  benzonatate (TESSALON) 100 MG capsule Take 2 capsules (200 mg total) by mouth 3 (three) times daily as needed. 02/14/21  Yes Dennies Coate, Raynelle FanningJulie, PA-C  nirmatrelvir/ritonavir EUA (PAXLOVID) 20 x 150 MG & 10 x 100MG  TABS Take 3 tablets by mouth 2 (two) times daily for 5 days. Patient GFR is >60. Take nirmatrelvir (150 mg) two tablets twice daily for 5 days and ritonavir (100 mg) one tablet twice daily for 5 days. 02/14/21 02/19/21 Yes Marka Treloar, Raynelle FanningJulie, PA-C  promethazine-dextromethorphan (PROMETHAZINE-DM) 6.25-15 MG/5ML syrup Take 5 mLs by mouth 4 (four) times daily as needed for cough. 02/14/21  Yes Manisha Cancel, Raynelle FanningJulie, PA-C  acetaminophen (TYLENOL) 500 MG tablet Take 1,000 mg by mouth every 6 (six) hours as needed (pain.).    [provider]  amLODipine (NORVASC) 10 MG tablet Take 10 mg by mouth daily.    [provider]  atorvastatin (LIPITOR) 10 MG tablet Take 10 mg by mouth every evening. 12/22/19   [provider]  cetirizine (ZYRTEC ALLERGY) 10 MG tablet Take 1 tablet (10 mg total) by mouth daily. 12/11/20   Wallis BambergMani, Mario, PA-C  ibuprofen (ADVIL) 800 MG tablet Take 1 tablet (800 mg total) by mouth every 8 (eight) hours as needed. 01/27/20   Noel ChristmasPace, Maryellen D, MD  ibuprofen (ADVIL,MOTRIN) 800 MG tablet Take 800 mg by mouth every 8 (eight) hours as needed for headache, mild pain or moderate pain. 03/12/17   [provider]  metoprolol tartrate (LOPRESSOR) 25 MG tablet Take 1 tablet by mouth twice daily Patient taking differently: Take 25 mg by mouth 2 (two) times daily. 10/23/18   Antoine PocheBranch, Jonathan F, MD  NOVOLIN N FLEXPEN RELION 100 UNIT/ML Kiwkpen Inject into the skin daily as needed (for blood sugar over 170). 10/04/19   [provider]  pseudoephedrine (SUDAFED) 30 MG tablet Take 1 tablet (30 mg total) by mouth every 8 (eight) hours as needed for congestion. 12/11/20   Wallis BambergMani, Mario, PA-C  SitaGLIPtin-MetFORMIN HCl (JANUMET XR) [phone] MG TB24 Take 1 tablet by mouth every evening. Patient receives SAMPLES    [provider]  tamsulosin (FLOMAX) 0.4 MG CAPS capsule Take 0.4 mg by mouth daily as needed (kidney stone). 01/20/20   [provider]  telmisartan (MICARDIS) 40 MG tablet Take 40 mg by mouth daily. 12/29/19   [provider]      Allergies    Patient has no known allergies.    Review of Systems   Review of Systems  Constitutional:  Negative for chills and fever.  HENT:  Positive for congestion and rhinorrhea. Negative for ear pain, sinus pressure, sore throat, trouble swallowing and voice change.   Eyes:  Negative for discharge.  Respiratory:  Positive for cough. Negative for shortness of breath, wheezing and stridor.   Cardiovascular:  Negative for chest pain.  Gastrointestinal:  Negative for abdominal pain, diarrhea, nausea and vomiting.  Genitourinary: Negative.   Musculoskeletal:  Positive for myalgias.   Physical Exam Updated Vital Signs BP  (!) 169/94 (BP Location: Right Arm)    Pulse 82    Temp 98.3 F (36.8 C) (Oral)    Resp 16    Ht 5\' 9"  (1.753 m)    Wt 117.9 kg    LMP 07/24/2015 Comment: currently experiencing cycle at this time   SpO2 97%    BMI 38.40 kg/m  Physical Exam Constitutional:      Appearance: She is well-developed.  HENT:     Head: Normocephalic and atraumatic.     Right Ear: Tympanic membrane and ear canal normal.     Left Ear: Tympanic membrane and ear canal normal.     Nose: Mucosal edema and rhinorrhea present.     Mouth/Throat:     Mouth: Mucous membranes are moist.     Pharynx: Oropharynx is clear. Uvula midline. No oropharyngeal exudate or posterior oropharyngeal erythema.     Tonsils: No tonsillar abscesses.  Eyes:     Conjunctiva/sclera: Conjunctivae normal.  Cardiovascular:     Rate and Rhythm: Normal rate.     Heart sounds: Normal heart sounds.  Pulmonary:     Effort: Pulmonary effort is normal. No respiratory distress.     Breath sounds: No wheezing or rales.     Comments: Frequent dry sounding cough. No respiratory distress. Abdominal:     Palpations: Abdomen is soft.     Tenderness: There is no abdominal tenderness.  Musculoskeletal:        General: Normal range of motion.  Skin:    General: Skin is warm and dry.     Findings: No rash.  Neurological:     Mental Status: She is alert and oriented to person, place, and time.    ED Results / Procedures / Treatments   Labs (all labs ordered are listed, but only abnormal results are displayed) Labs Reviewed  RESP PANEL BY RT-PCR (FLU A&B, COVID) ARPGX2 - Abnormal; Notable for the following components:      Result Value   SARS Coronavirus 2 by RT PCR POSITIVE (*)    All other components within normal limits  I-STAT CHEM 8, ED  I-STAT CREATININE, ED    EKG None  Radiology No results found.  Procedures Procedures    Medications Ordered in ED Medications - No data to display  ED Course/ Medical Decision Making/ A&P                            Medical Decision Making Pt with positive covid, day 5 of sx, with persistent cough, no sob, current fever, n/v, chest pain.  Amount and/or Complexity of Data Reviewed Labs: ordered.    Details: creatinine ordered to ensure safety with prescribing Paxlovid. Confirmed covid 19 here. Radiology: ordered and independent interpretation performed.    Details: cxr clear without pneumonia  Risk Prescription drug management.  Prescribed additional promethezine/dex for cough.  Pt stable for dc, no hypoxia or respiratory distress.  Work note given, discussed  other home tx for sx relief.  Discussed CDC rec for home quarantine.  SOPHIEA UEDA was evaluated in Emergency Department on 02/17/2021 for the symptoms described in the history of present illness. She was evaluated in the context of the global COVID-19 pandemic, which necessitated consideration that the patient might be at risk for infection with the SARS-CoV-2 virus that causes COVID-19. Institutional protocols and algorithms that pertain to the evaluation of patients at risk for COVID-19 are in a state of rapid change based on information released by regulatory bodies including the CDC and federal and state organizations. These policies and algorithms were followed during the patient's care in the ED.          Final Clinical Impression(s) / ED Diagnoses Final diagnoses:  COVID-19    Rx / DC Orders ED Discharge Orders          Ordered    benzonatate (TESSALON) 100 MG capsule  3 times daily PRN        02/14/21 1323    promethazine-dextromethorphan (PROMETHAZINE-DM) 6.25-15 MG/5ML syrup  4 times daily PRN        02/14/21 1323    nirmatrelvir/ritonavir EUA (PAXLOVID) 20 x 150 MG & 10 x 100MG  TABS  2 times daily        02/14/21 1516              02/16/21, PA-C 02/17/21 1411    02/19/21, MD 02/23/21 [phone]

## 2021-05-17 NOTE — Progress Notes (Incomplete)
?Cardiology Office Note:   ? ?Date:  05/17/2021  ? ?ID:  Melissa Casey, DOB [birth year], MRN [id number] ? ?PCP:  Iona Beard, MD ?  ?[facility] Providers ?Cardiologist:  Carlyle Dolly, MD { ?Click to update primary MD,subspecialty MD or APP then REFRESH:1}   ? ?Referring MD: Iona Beard, MD  ? ?No chief complaint on file. ? ? ?History of Present Illness:   ? ?Melissa Casey is a 56 y.o. female with a hx of DVT (LLE, 20+ years ago), hypertension, type 2 diabetes mellitus, and nephrolithiasis, here for the evaluation of chest heaviness at the request of Dr. Berdine Addison. She was last seen in cardiology by Bernerd Pho, PA-C 03/2017 after presenting to the ED 03/2017 with concerns of intermittent palpitations and nausea. She was found to have frequent PVCs in the setting of hypokalemia. On 02/14/2021 she was seen in the ED with persistent cough, having tested positive for COVID 4 days prior at home testing. She then saw Dr. Berdine Addison 03/14/2021 reporting chest heaviness for 3 nights, each episode lasting 10 minutes. ? ?Today, *** ? ? ?Past Medical History:  ?Diagnosis Date  ? DVT (deep venous thrombosis) ([place])   ? remote past, over 20 years ago, left leg  ? Essential hypertension, benign   ? History of COVID-19 08/2019  ? History of kidney stones   ? IBS (irritable bowel syndrome)   ? Premature ventricular contractions (PVCs) (VPCs)   ? Per Dr. Berdine Addison  ? Type 2 diabetes mellitus ([place])   ? ? ?Past Surgical History:  ?Procedure Laterality Date  ? COLONOSCOPY N/A 07/26/2015  ? Procedure: COLONOSCOPY;  Surgeon: Danie Binder, MD;  [facility];  Service: Endoscopy;  Laterality: N/A;  130 - moved to 6/26 @1 :30 - office to notify pt  ? CYSTOSCOPY WITH RETROGRADE PYELOGRAM, URETEROSCOPY AND STENT PLACEMENT Right 12/24/2019  ? Procedure: CYSTOSCOPY WITH RETROGRADE PYELOGRAM, URETEROSCOPY AND STENT PLACEMENT;  Surgeon: Raynelle Bring, MD;  Location: [facility];  Service: Urology;  Laterality: Right;  ?  CYSTOSCOPY/URETEROSCOPY/HOLMIUM LASER/STENT PLACEMENT Right 01/27/2020  ? Procedure: CYSTOSCOPY/URETEROSCOPY/HOLMIUM LASER/STENT PLACEMENT;  Surgeon: Robley Fries, MD;  Location: [facility];  Service: Urology;  Laterality: Right;  ? ESOPHAGOGASTRODUODENOSCOPY N/A 07/26/2015  ? Procedure: ESOPHAGOGASTRODUODENOSCOPY (EGD);  Surgeon: Danie Binder, MD;  [facility];  Service: Endoscopy;  Laterality: N/A;  ? ? ?Current Medications: ?No outpatient medications have been marked as taking for the 05/19/21 encounter (Appointment) with Skeet Latch, MD.  ?  ? ?Allergies:   Patient has no known allergies.  ? ?Social History  ? ?Socioeconomic History  ? Marital status: Married  ?  Spouse name: Not on file  ? Number of children: 1  ? Years of education: Not on file  ? Highest education level: Not on file  ?Occupational History  ? 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 Occupation: [employer]   ?  Comment: part time  ? Occupation:    ?Tobacco Use  ? Smoking status: Never  ? Smokeless tobacco: Never  ?Vaping Use  ? Vaping Use: Never used  ?Substance and Sexual Activity  ? Alcohol use: No  ?  Alcohol/week: 0.0 standard drinks  ? Drug use: No  ? Sexual activity: Not on file  ?Other Topics Concern  ? Not on file  ?Social History Narrative  ? Not on file  ? ?Social Determinants of Health  ? ?Financial Resource Strain: Not  on file  ?Food Insecurity: Not on file  ?Transportation Needs: Not on file  ?Physical Activity: Not on file  ?Stress: Not on file  ?Social Connections: Not on file  ?  ? ?Family History: ?The patient's family history includes Arrhythmia in her sister; Coronary artery disease in her father and mother; Hypertension in her brother. There is no history of Colon cancer. ? ?ROS:   ?Please see the history of present illness.    ?All other systems reviewed and are negative. ? ?EKGs/Labs/Other Studies Reviewed:   ? ?The following studies were  reviewed today: ? ?Echo 03/23/2017: ?Study Conclusions  ? ?- Left ventricle: The cavity size was normal. Wall thickness was  ?  increased in a pattern of mild LVH. Systolic function was normal.  ?  The estimated ejection fraction was in the range of 60% to 65%.  ?  Wall motion was normal; there were no regional wall motion  ?  abnormalities. Left ventricular diastolic function parameters  ?  were normal for the patient&'s age.  ?- Mitral valve: There was trivial regurgitation.  ?- Left atrium: The atrium was at the upper limits of normal in  ?  size.  ?- Right atrium: Central venous pressure (est): 3 mm Hg.  ?- Tricuspid valve: There was trivial regurgitation.  ?- Pulmonary arteries: Systolic pressure could not be accurately  ?  estimated.  ?- Pericardium, extracardiac: There was no pericardial effusion.  ? ? ?EKG:   EKG is personally reviewed. ?05/19/2021: Sinus ***. Rate *** bpm. ? ?Recent Labs: ?02/14/2021: Creatinine, Ser 0.80  ? ?Recent Lipid Panel ?No results found for: CHOL, TRIG, HDL, CHOLHDL, VLDL, LDLCALC, LDLDIRECT ? ? ?Risk Assessment/Calculations:   ?{Does this patient have ATRIAL FIBRILLATION?:[phone]} ? ?    ? ?Physical Exam:   ? ?Wt Readings from Last 3 Encounters:  ?02/14/21 260 lb (117.9 kg)  ?01/27/20 263 lb 3.2 oz (119.4 kg)  ?01/26/20 263 lb 5 oz (119.4 kg)  ?  ? ?VS:  LMP 07/24/2015 Comment: currently experiencing cycle at this time , BMI There is no height or weight on file to calculate BMI. ?GENERAL:  Well appearing ?HEENT: Pupils equal round and reactive, fundi not visualized, oral mucosa unremarkable ?NECK:  No jugular venous distention, waveform within normal limits, carotid upstroke brisk and symmetric, no bruits, no thyromegaly ?LYMPHATICS:  No cervical adenopathy ?LUNGS:  Clear to auscultation bilaterally ?HEART:  RRR.  PMI not displaced or sustained,S1 and S2 within normal limits, no S3, no S4, no clicks, no rubs, *** murmurs ?ABD:  Flat, positive bowel sounds normal in frequency in  pitch, no bruits, no rebound, no guarding, no midline pulsatile mass, no hepatomegaly, no splenomegaly ?EXT:  2 plus pulses throughout, no edema, no cyanosis no clubbing ?SKIN:  No rashes no nodules ?NEURO:  Cranial nerves II through XII grossly intact, motor grossly intact throughout ?PSYCH:  Cognitively intact, oriented to person place and time ? ? ?ASSESSMENT:   ? ?No diagnosis found. ?PLAN:   ? ?No problem-specific Assessment & Plan notes found for this encounter. ? ? ? ?{Are you ordering a CV Procedure (e.g. stress test, cath, DCCV, TEE, etc)?   Press F2        :[id number]  ? ?Disposition: ?FU with Tiffany C. Oval Linsey, MD, [facility] in *** ? ?Medication Adjustments/Labs and Tests Ordered: ?Current medicines are reviewed at length with the patient today.  Concerns regarding medicines are outlined above.  ? ?No orders of the defined types were placed in this encounter. ? ?  No orders of the defined types were placed in this encounter. ? ?There are no Patient Instructions on file for this visit.  ? ?I,Mathew Stumpf,acting as a Education administrator for Skeet Latch, MD.,have documented all relevant documentation on the behalf of Skeet Latch, MD,as directed by  Skeet Latch, MD while in the presence of Skeet Latch, MD. ? ?*** ? ?Signed, ?Madelin Rear  ?05/17/2021 11:09 AM    ?[place] ?

## 2021-05-19 ENCOUNTER — Ambulatory Visit (HOSPITAL_BASED_OUTPATIENT_CLINIC_OR_DEPARTMENT_OTHER): Payer: BC Managed Care – PPO | Admitting: Cardiovascular Disease

## 2021-05-19 NOTE — Progress Notes (Signed)
?Cardiology Office Note:   ? ?Date:  05/23/2021  ? ?ID:  Melissa LanaCassandra N Casey, DOB Mar 13, 1965, MRN [id number] ? ?PCP:  Mirna MiresHill, Gerald, MD  ?Cardiologist:  Dina RichBranch, Jonathan, MD   ? ?Referring MD: Mirna MiresHill, Gerald, MD  ? ?No chief complaint on file. ? ? ?History of Present Illness:   ? ?Melissa Casey is a 56 y.o. female with a hx of hypertension, hyperlipidemia, diabetes milltus type 2, and obesity here today for the evaluation and management of chest heaviness at the request of Dr. Loleta ChanceHill. She saw Dr. Loleta ChanceHill 03/2021 and complained chest discomfort/heaviness with associated sweating, nausea, and L arm pain. The heaviness occurred for 3 nights and each episode lasted 10 minutes. Of note, she endorsed a family history of heart disease in her sister who died of MI at 56 years old. She saw [place] PA-C 03/2017 after presenting to the ED for palpitations and nausea. Monitor showed frequent PVCs in the setting of hypokalemia. She was started on metoprolol tartrate 25 mg BID. She had COVID-19 01/2021. She wore a holter monitor 01/2017 with frequent PVCs and occasional PACs. Echo 03/2017 revealed LVEF 60-65% with normal diastolic function.  ? ?Today, she is doing well. 2.5 weeks ago, she started feeling nightly chest discomfort that she describes as feeling like indigestion. The discomfort primarily occurs at night when she is laying down. She occasional feels the discomfort during the day. She tried taking a Tums but the discomfort did not resolve. She endorses intermittent L arm tingling that occurs regardless of exertion with associated L-sided neck and shoulder pain. For exercise, she enjoys walking. After walking, she feels her heart palpitate and she becomes tired and short of breath. She endorses occasional LE swelling. About a month ago, she was given an "orange pill" to take daily for the swelling. Her blood pressure at home is typically within 140s over 80s. This morning, her blood pressure was 146/83. She does not  smoke or drink alcohol. For diet, she avoids salt in her food. She does not drink alcohol or smoke. 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 She also works at [employer]. Her little sister passed away 12/2020 of MI at 56 years old. Her mother, father, and brother all had cardiovascular disease. She denies any lightheadedness, headaches, syncope, orthopnea, or PND. ? ?Past Medical History:  ?Diagnosis Date  ? DVT (deep venous thrombosis) ([facility])   ? remote past, over 20 years ago, left leg  ? Essential hypertension, benign   ? History of COVID-19 08/2019  ? History of kidney stones   ? IBS (irritable bowel syndrome)   ? Premature ventricular contractions (PVCs) ([facility])   ? Per Dr. Loleta ChanceHill  ? Pure hypercholesterolemia 05/23/2021  ? Type 2 diabetes mellitus ([facility])   ? ? ?Past Surgical History:  ?Procedure Laterality Date  ? COLONOSCOPY N/A 07/26/2015  ? Procedure: COLONOSCOPY;  Surgeon: West BaliSandi L Fields, MD;  [facility];  Service: Endoscopy;  Laterality: N/A;  130 - moved to 6/26 @1 :30 - office to notify pt  ? CYSTOSCOPY WITH RETROGRADE PYELOGRAM, URETEROSCOPY AND STENT PLACEMENT Right 12/24/2019  ? Procedure: CYSTOSCOPY WITH RETROGRADE PYELOGRAM, URETEROSCOPY AND STENT PLACEMENT;  Surgeon: Heloise PurpuraBorden, Lester, MD;  Location: [facility];  Service: Urology;  Laterality: Right;  ? CYSTOSCOPY/URETEROSCOPY/HOLMIUM LASER/STENT PLACEMENT Right 01/27/2020  ? Procedure: CYSTOSCOPY/URETEROSCOPY/HOLMIUM LASER/STENT PLACEMENT;  Surgeon: Noel ChristmasPace, Maryellen D, MD;  Location: [facility];  Service: Urology;  Laterality: Right;  ?  ESOPHAGOGASTRODUODENOSCOPY N/A 07/26/2015  ? Procedure: ESOPHAGOGASTRODUODENOSCOPY (EGD);  Surgeon: West Bali, MD;  [facility];  Service: Endoscopy;  Laterality: N/A;  ? ? ?Current Medications: ?Current Meds  ?Medication Sig  ? acetaminophen (TYLENOL) 500 MG tablet Take 1,000 mg by mouth every 6 (six) hours as needed (pain.).  ?  amLODipine (NORVASC) 10 MG tablet Take 10 mg by mouth daily.  ? atorvastatin (LIPITOR) 10 MG tablet Take 10 mg by mouth every evening.  ? cetirizine (ZYRTEC ALLERGY) 10 MG tablet Take 1 tablet (10 mg total) by mouth daily.  ? hydrochlorothiazide (HYDRODIURIL) 25 MG tablet Take 25 mg by mouth daily.  ? ibuprofen (ADVIL,MOTRIN) 800 MG tablet Take 800 mg by mouth every 8 (eight) hours as needed for headache, mild pain or moderate pain.  ? metoprolol tartrate (LOPRESSOR) 25 MG tablet Take 1 tablet by mouth twice daily (Patient taking differently: Take 25 mg by mouth 2 (two) times daily.)  ? NOVOLIN N FLEXPEN RELION 100 UNIT/ML Kiwkpen Inject into the skin daily as needed (for blood sugar over 170).  ? SitaGLIPtin-MetFORMIN HCl (JANUMET XR) [phone] MG TB24 Take 1 tablet by mouth every evening. Patient receives SAMPLES  ? telmisartan (MICARDIS) 40 MG tablet Take 40 mg by mouth daily.  ?  ? ?Allergies:   Patient has no known allergies.  ? ?Social History  ? ?Socioeconomic History  ? Marital status: Married  ?  Spouse name: Not on file  ? Number of children: 1  ? Years of education: Not on file  ? Highest education level: Not on file  ?Occupational History  ? 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 Occupation: [employer]   ?  Comment: part time  ? Occupation:    ?Tobacco Use  ? Smoking status: Never  ? Smokeless tobacco: Never  ?Vaping Use  ? Vaping Use: Never used  ?Substance and Sexual Activity  ? Alcohol use: No  ?  Alcohol/week: 0.0 standard drinks  ? Drug use: No  ? Sexual activity: Not on file  ?Other Topics Concern  ? Not on file  ?Social History Narrative  ? Not on file  ? ?Social Determinants of Health  ? ?Financial Resource Strain: Low Risk   ? Difficulty of Paying Living Expenses: Not hard at all  ?Food Insecurity: No Food Insecurity  ? Worried About Programme researcher, broadcasting/film/video in the Last Year: Never true  ? Ran Out of Food in the Last Year: Never true   ?Transportation Needs: No Transportation Needs  ? Lack of Transportation (Medical): No  ? Lack of Transportation (Non-Medical): No  ?Physical Activity: Inactive  ? Days of Exercise per Week: 0 days  ? Minutes of Exercise per Session: 0 min  ?Stress: Not on file  ?Social Connections: Not on file  ?  ? ?Family History: ?The patient's family history includes Arrhythmia in her sister; Coronary artery disease in her father and mother; Heart attack (age of onset: 81) in her sister; Heart disease in her brother and father; Hypertension in her brother. There is no history of Colon cancer. ? ?ROS:   ?Please see the history of present illness.    ?(+) Chest discomfort ?(+) L arm tingling ?(+) L-sided neck/shoulder pain ?(+) Palpitations ?(+) Shortness of breath with exertion ?(+) LE edema ?All other systems reviewed and negative.  ? ?EKGs/Labs/Other Studies Reviewed:   ? ?The following studies were reviewed today: ?Echo 03/23/17 ?-  Left ventricle: The cavity size was normal. Wall thickness was  ?  increased in a pattern of mild LVH. Systolic function was normal.  ?  The estimated ejection fraction was in the range of 60% to 65%.  ?  Wall motion was normal; there were no regional wall motion  ?  abnormalities. Left ventricular diastolic function parameters  ?  were normal for the patient&'s age.  ?- Mitral valve: There was trivial regurgitation.  ?- Left atrium: The atrium was at the upper limits of normal in  ?  size.  ?- Right atrium: Central venous pressure (est): 3 mm Hg.  ?- Tricuspid valve: There was trivial regurgitation.  ?- Pulmonary arteries: Systolic pressure could not be accurately  ?  estimated.  ?- Pericardium, extracardiac: There was no pericardial effusion.  ? ?Monitor 02/28/17 ?Sinus rhythm with frequent PVC's and occasional PAC's. ?No diary was returned. ? ?EKG:   ?05/23/21: Sinus rhythm rate 73 bpm; cannot rule out prior inferior infarct and poor R-wave progression ? ?Recent Labs: ?02/14/2021: Creatinine, Ser  0.80  ? ?Recent Lipid Panel ?No results found for: CHOL, TRIG, HDL, CHOLHDL, VLDL, LDLCALC, LDLDIRECT ? ? ?Physical Exam:   ? ?VS:  BP (!) 144/82 (BP Location: Right Arm, Patient Position: Sitting, Cuff Size: Lar

## 2021-05-23 ENCOUNTER — Encounter (HOSPITAL_BASED_OUTPATIENT_CLINIC_OR_DEPARTMENT_OTHER): Payer: Self-pay | Admitting: Cardiovascular Disease

## 2021-05-23 ENCOUNTER — Encounter (HOSPITAL_BASED_OUTPATIENT_CLINIC_OR_DEPARTMENT_OTHER): Payer: Self-pay | Admitting: *Deleted

## 2021-05-23 ENCOUNTER — Ambulatory Visit (INDEPENDENT_AMBULATORY_CARE_PROVIDER_SITE_OTHER): Payer: BC Managed Care – PPO | Admitting: Cardiovascular Disease

## 2021-05-23 VITALS — BP 144/82 | HR 73 | Ht 69.0 in | Wt 274.5 lb

## 2021-05-23 DIAGNOSIS — Z5181 Encounter for therapeutic drug level monitoring: Secondary | ICD-10-CM

## 2021-05-23 DIAGNOSIS — I493 Ventricular premature depolarization: Secondary | ICD-10-CM | POA: Diagnosis not present

## 2021-05-23 DIAGNOSIS — R072 Precordial pain: Secondary | ICD-10-CM

## 2021-05-23 DIAGNOSIS — I10 Essential (primary) hypertension: Secondary | ICD-10-CM

## 2021-05-23 DIAGNOSIS — E7800 Pure hypercholesterolemia, unspecified: Secondary | ICD-10-CM | POA: Insufficient documentation

## 2021-05-23 HISTORY — DX: Pure hypercholesterolemia, unspecified: E78.00

## 2021-05-23 MED ORDER — TELMISARTAN 80 MG PO TABS: 80.0000 mg | ORAL_TABLET | Freq: Every day | ORAL | 3 refills | Status: DC

## 2021-05-23 NOTE — Assessment & Plan Note (Signed)
Continue metoprolol. 

## 2021-05-23 NOTE — Assessment & Plan Note (Signed)
Symptoms are atypical and not with exertion.  However, she does have exertional dyspnea and her sister recently died of MI at age 56.  We will get a coronary CTA to rule out obstructive CAD.   ?

## 2021-05-23 NOTE — Assessment & Plan Note (Signed)
Continue atorvastatin

## 2021-05-23 NOTE — Patient Instructions (Signed)
Medication Instructions:  ?INCREASE YOUR TELMISARTAN TO 80 MG DAILY  ? ?TAKE 4 OF YOUR METOPROLOL 25 MG 2 HOURS PRIOR TO YOUR CARDIAC CT  ? ?*If you need a refill on your cardiac medications before your next appointment, please call your pharmacy ? ?Lab Work: ?BMET IN 1 WEEK  ? ?If you have labs (blood work) drawn today and your tests are completely normal, you will receive your results only by: ?MyChart Message (if you have MyChart) OR ?A paper copy in the mail ?If you have any lab test that is abnormal or we need to change your treatment, we will call you to review the results. ? ? ?Testing/Procedures: ?Your physician has requested that you have cardiac CT. Cardiac computed tomography (CT) is a painless test that uses an x-ray machine to take clear, detailed pictures of your heart. For further information please visit [email or web address]. Please follow instruction sheet as given. ? ? ?Follow-Up: ?At [facility], you and your health needs are our priority.  As part of our continuing mission to provide you with exceptional heart care, we have created designated Provider Care Teams.  These Care Teams include your primary Cardiologist (physician) and Advanced Practice Providers (APPs -  Physician Assistants and Nurse Practitioners) who all work together to provide you with the care you need, when you need it. ? ?We recommend signing up for the patient portal called "MyChart".  Sign up information is provided on this After Visit Summary.  MyChart is used to connect with patients for Virtual Visits (Telemedicine).  Patients are able to view lab/test results, encounter notes, upcoming appointments, etc.  Non-urgent messages can be sent to your provider as well.   ?To learn more about what you can do with MyChart, go to [email or web address].   ? ?Your next appointment:   ?  ?07/15/2021 AT 11:40 AM WITH DR Armada  ? ? ?Other Instructions ? ? ? ? ?Your cardiac CT will be scheduled at one of the below locations:   ? ?[facility] ?[address] ?[place], [place] [address] ?[phone] ? ?OR ? ?[facility] ?[address] ?[place], [place] [address] ?[phone] ?If scheduled at [facility], please arrive at the [facility] and Children's Entrance (Entrance C2) of [facility] 30 minutes prior to test start time. ?You can use the FREE valet parking offered at entrance C (encouraged to control the heart rate for the test)  ?Proceed to the [facility] Radiology Department (first floor) to check-in and test prep. ? ?All radiology patients and guests should use entrance C2 at [facility], accessed from [facility], even though the hospital's physical address listed is [address]. ? ? ? ?If scheduled at [facility], please arrive 15 mins early for check-in and test prep. ? ?Please follow these instructions carefully (unless otherwise directed): ? ?Hold all erectile dysfunction medications at least 3 days (72 hrs) prior to test. ? ?On the Night Before the Test: ?Be sure to Drink plenty of water. ?Do not consume any caffeinated/decaffeinated beverages or chocolate 12 hours prior to your test. ?Do not take any antihistamines 12 hours prior to your test. ?If the patient has contrast allergy: ?Patient will need a prescription for Prednisone and very clear instructions (as follows): ?Prednisone 50 mg - take 13 hours prior to test ?Take another Prednisone 50 mg 7 hours prior to test ?Take another Prednisone 50 mg 1 hour prior to test ?Take  Benadryl 50 mg 1 hour prior to test ?Patient must complete all four doses of above prophylactic medications. ?Patient will need a ride after test due to Benadryl. ? ?On the Day of the Test: ?Drink plenty of water until 1 hour prior to the test. ?Do not eat any food 4 hours prior to the test. ?You may take your regular medications prior to the test.  ?Take metoprolol  (Lopressor) two hours prior to test. ?HOLD Furosemide/Hydrochlorothiazide morning of the test. ?FEMALES- please wear underwire-free bra if available, avoid dresses & tight clothing ?     ?After the Test: ?Drink plenty of water. ?After receiving IV contrast, you may experience a mild flushed feeling. This is normal. ?On occasion, you may experience a mild rash up to 24 hours after the test. This is not dangerous. If this occurs, you can take Benadryl 25 mg and increase your fluid intake. ?If you experience trouble breathing, this can be serious. If it is severe call 911 IMMEDIATELY. If it is mild, please call our office. ?If you take any of these medications: Glipizide/Metformin, Avandament, Glucavance, please do not take 48 hours after completing test unless otherwise instructed. ? ?We will call to schedule your test 2-4 weeks out understanding that some insurance companies will need an authorization prior to the service being performed.  ? ?For non-scheduling related questions, please contact the cardiac imaging nurse navigator should you have any questions/concerns: ?Marchia Bond, Cardiac Imaging Nurse Navigator ?Gordy Clement, Cardiac Imaging Nurse Navigator ?[facility] ?Direct Office Dial: [phone]  ? ?For scheduling needs, including cancellations and rescheduling, please call [place], [phone]. ? ?Cardiac CT Angiogram ?A cardiac CT angiogram is a procedure to look at the heart and the area around the heart. It may be done to help find the cause of chest pains or other symptoms of heart disease. During this procedure, a substance called contrast dye is injected into the blood vessels in the area to be checked. A large X-ray machine, called a CT scanner, then takes detailed pictures of the heart and the surrounding area. The procedure is also sometimes called a coronary CT angiogram, coronary artery scanning, or CTA. ?A cardiac CT angiogram allows the health care provider to  see how well blood is flowing to and from the heart. The health care provider will be able to see if there are any problems, such as: ?Blockage or narrowing of the coronary arteries in the heart. ?Fluid around the heart. ?Signs of weakness or disease in the muscles, valves, and tissues of the heart. ?Tell a health care provider about: ?Any allergies you have. This is especially important if you have had a previous allergic reaction to contrast dye. ?All medicines you are taking, including vitamins, herbs, eye drops, creams, and over-the-counter medicines. ?Any blood disorders you have. ?Any surgeries you have had. ?Any medical conditions you have. ?Whether you are pregnant or may be pregnant. ?Any anxiety disorders, chronic pain, or other conditions you have that may increase your stress or prevent you from lying still. ?What are the risks? ?Generally, this is a safe procedure. However, problems may occur, including: ?Bleeding. ?Infection. ?Allergic reactions to medicines or dyes. ?Damage to other structures or organs. ?Kidney damage from the contrast dye that is used. ?Increased risk of cancer from radiation exposure. This risk is low. Talk with your health care provider about: ?The risks and benefits of testing. ?How you can receive the lowest dose of radiation. ?What happens before the procedure? ?Wear comfortable  clothing and remove any jewelry, glasses, dentures, and hearing aids. ?Follow instructions from your health care provider about eating and drinking. This may include: ?For 12 hours before the procedure -- avoid caffeine. This includes tea, coffee, soda, energy drinks, and diet pills. Drink plenty of water or other fluids that do not have caffeine in them. Being well hydrated can prevent complications. ?For 4-6 hours before the procedure -- stop eating and drinking. The contrast dye can cause nausea, but this is less likely if your stomach is empty. ?Ask your health care provider about changing or  stopping your regular medicines. This is especially important if you are taking diabetes medicines, blood thinners, or medicines to treat problems with erections (erectile dysfunction). ?What happens during the procedu

## 2021-05-23 NOTE — Assessment & Plan Note (Signed)
Blood pressure at home has been in the 140-150s/80.s  Goal is <130/80.  We will increase telmisartan to 80mg .  Check a BMP in one week.  She prefers to go to our [place] office.  After getting her coronary CT-A she will increase exercise if cleared by her imaging.  Continue to limit sodium intake.  ?

## 2021-06-06 ENCOUNTER — Telehealth (HOSPITAL_COMMUNITY): Payer: Self-pay | Admitting: Emergency Medicine

## 2021-06-06 NOTE — Telephone Encounter (Signed)
Reaching out to patient to offer assistance regarding upcoming cardiac imaging study; pt verbalizes understanding of appt date/time, parking situation and where to check in, pre-test NPO status and medications ordered, and verified current allergies; name and call back number provided for further questions should they arise ?Rockwell Alexandria RN Navigator Cardiac Imaging ?[facility] ?[phone] office ?[phone] cell ? ?Difficult iv  ?100mg  metoprolol tartrate ?Arrival 1030 ?

## 2021-06-07 ENCOUNTER — Ambulatory Visit (HOSPITAL_COMMUNITY)
Admission: RE | Admit: 2021-06-07 | Discharge: 2021-06-07 | Disposition: A | Discharge status: Home / Self Care | Payer: BC Managed Care – PPO | Source: Ambulatory Visit | Attending: Cardiovascular Disease | Admitting: Cardiovascular Disease

## 2021-06-07 ENCOUNTER — Ambulatory Visit (HOSPITAL_COMMUNITY): Payer: BC Managed Care – PPO

## 2021-06-07 ENCOUNTER — Encounter (HOSPITAL_COMMUNITY): Payer: Self-pay

## 2021-06-07 DIAGNOSIS — I2510 Atherosclerotic heart disease of native coronary artery without angina pectoris: Secondary | ICD-10-CM

## 2021-06-07 DIAGNOSIS — R072 Precordial pain: Secondary | ICD-10-CM | POA: Diagnosis present

## 2021-06-07 DIAGNOSIS — R931 Abnormal findings on diagnostic imaging of heart and coronary circulation: Secondary | ICD-10-CM | POA: Insufficient documentation

## 2021-06-07 DIAGNOSIS — E7800 Pure hypercholesterolemia, unspecified: Secondary | ICD-10-CM | POA: Diagnosis present

## 2021-06-07 DIAGNOSIS — I493 Ventricular premature depolarization: Secondary | ICD-10-CM | POA: Insufficient documentation

## 2021-06-07 DIAGNOSIS — I10 Essential (primary) hypertension: Secondary | ICD-10-CM | POA: Diagnosis present

## 2021-06-07 MED ORDER — IOHEXOL 350 MG/ML SOLN
100.0000 mL | Freq: Once | INTRAVENOUS | Status: AC | PRN
  Administered 2021-06-07: 100 mL via INTRAVENOUS

## 2021-06-07 MED ORDER — NITROGLYCERIN 0.4 MG SL SUBL
0.8000 mg | SUBLINGUAL_TABLET | Freq: Once | SUBLINGUAL | Status: AC
  Administered 2021-06-07: 0.8 mg via SUBLINGUAL

## 2021-06-07 MED ORDER — NITROGLYCERIN 0.4 MG SL SUBL
SUBLINGUAL_TABLET | SUBLINGUAL | Status: AC
  Filled 2021-06-07: qty 2

## 2021-06-08 LAB — POCT I-STAT CREATININE: Creatinine, Ser: 0.6 mg/dL (ref 0.44–1.00)

## 2021-06-09 ENCOUNTER — Ambulatory Visit (HOSPITAL_COMMUNITY)
Admission: RE | Admit: 2021-06-09 | Discharge: 2021-06-09 | Disposition: A | Discharge status: Home / Self Care | Payer: BC Managed Care – PPO | Source: Ambulatory Visit | Attending: Internal Medicine | Admitting: Internal Medicine

## 2021-06-09 ENCOUNTER — Other Ambulatory Visit: Payer: Self-pay | Admitting: Internal Medicine

## 2021-06-09 ENCOUNTER — Ambulatory Visit (HOSPITAL_BASED_OUTPATIENT_CLINIC_OR_DEPARTMENT_OTHER)
Admission: RE | Admit: 2021-06-09 | Discharge: 2021-06-09 | Disposition: A | Discharge status: Home / Self Care | Payer: BC Managed Care – PPO | Source: Ambulatory Visit | Attending: Internal Medicine | Admitting: Internal Medicine

## 2021-06-09 DIAGNOSIS — R931 Abnormal findings on diagnostic imaging of heart and coronary circulation: Secondary | ICD-10-CM

## 2021-06-20 ENCOUNTER — Telehealth (HOSPITAL_BASED_OUTPATIENT_CLINIC_OR_DEPARTMENT_OTHER): Payer: Self-pay | Admitting: *Deleted

## 2021-06-20 DIAGNOSIS — Z5181 Encounter for therapeutic drug level monitoring: Secondary | ICD-10-CM

## 2021-06-20 DIAGNOSIS — E7800 Pure hypercholesterolemia, unspecified: Secondary | ICD-10-CM

## 2021-06-20 DIAGNOSIS — I10 Essential (primary) hypertension: Secondary | ICD-10-CM

## 2021-06-20 MED ORDER — METOPROLOL TARTRATE 50 MG PO TABS: 50.0000 mg | ORAL_TABLET | Freq: Two times a day (BID) | ORAL | 3 refills | Status: DC

## 2021-06-20 NOTE — Telephone Encounter (Signed)
Advised patient of lab results   Patient stated she has not been taking the Atorvastatin, her PCP stopped She is unsure why or when stopped  Will forward to Dr Duke Salvia for review

## 2021-06-20 NOTE — Telephone Encounter (Signed)
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  Recommend that she start aspirin 81mg  daily.  Increase atorvastatin to 20mg  and metoprolol to 50mg  bid.  We will discuss more at her follow up next month.

## 2021-06-23 MED ORDER — ATORVASTATIN CALCIUM 10 MG PO TABS: 10.0000 mg | ORAL_TABLET | Freq: Every evening | ORAL | 3 refills | Status: DC

## 2021-06-23 NOTE — Telephone Encounter (Signed)
Advised patient, verbalized understanding  Mailed lab orders  

## 2021-06-23 NOTE — Telephone Encounter (Signed)
Patient returning call.

## 2021-06-23 NOTE — Telephone Encounter (Signed)
Jun 22, 2021 Chilton Si, MD to Me      7:18 PM OK then start atorvastatin 10mg .  Lipids/CMP in 2-3 months.   Left message to call back

## 2021-07-13 LAB — COMPREHENSIVE METABOLIC PANEL
ALT: 30 IU/L (ref 0–32)
AST: 26 IU/L (ref 0–40)
Albumin/Globulin Ratio: 1.8 (ref 1.2–2.2)
Albumin: 4.6 g/dL (ref 3.8–4.9)
Alkaline Phosphatase: 81 IU/L (ref 44–121)
BUN/Creatinine Ratio: 17 (ref 9–23)
BUN: 12 mg/dL (ref 6–24)
Bilirubin Total: 0.2 mg/dL (ref 0.0–1.2)
CO2: 24 mmol/L (ref 20–29)
Calcium: 9.1 mg/dL (ref 8.7–10.2)
Chloride: 96 mmol/L (ref 96–106)
Creatinine, Ser: 0.7 mg/dL (ref 0.57–1.00)
Globulin, Total: 2.5 g/dL (ref 1.5–4.5)
Glucose: 166 mg/dL — ABNORMAL HIGH (ref 70–99)
Potassium: 3.5 mmol/L (ref 3.5–5.2)
Sodium: 138 mmol/L (ref 134–144)
Total Protein: 7.1 g/dL (ref 6.0–8.5)
eGFR: 101 mL/min/{1.73_m2} (ref >59)

## 2021-07-13 LAB — LIPID PANEL
Chol/HDL Ratio: 2.6 ratio (ref 0.0–4.4)
Cholesterol, Total: 123 mg/dL (ref 100–199)
HDL: 47 mg/dL (ref >39)
LDL Chol Calc (NIH): 58 mg/dL (ref 0–99)
Triglycerides: 98 mg/dL (ref 0–149)
VLDL Cholesterol Cal: 18 mg/dL (ref 5–40)

## 2021-07-15 ENCOUNTER — Other Ambulatory Visit: Payer: Self-pay | Admitting: Cardiovascular Disease

## 2021-07-15 ENCOUNTER — Encounter (HOSPITAL_BASED_OUTPATIENT_CLINIC_OR_DEPARTMENT_OTHER): Payer: Self-pay | Admitting: Cardiovascular Disease

## 2021-07-15 ENCOUNTER — Ambulatory Visit (INDEPENDENT_AMBULATORY_CARE_PROVIDER_SITE_OTHER): Payer: BC Managed Care – PPO | Admitting: Cardiovascular Disease

## 2021-07-15 VITALS — BP 146/80 | HR 70 | Ht 69.0 in | Wt 273.8 lb

## 2021-07-15 DIAGNOSIS — Z01812 Encounter for preprocedural laboratory examination: Secondary | ICD-10-CM

## 2021-07-15 DIAGNOSIS — I493 Ventricular premature depolarization: Secondary | ICD-10-CM

## 2021-07-15 DIAGNOSIS — K219 Gastro-esophageal reflux disease without esophagitis: Secondary | ICD-10-CM

## 2021-07-15 DIAGNOSIS — I10 Essential (primary) hypertension: Secondary | ICD-10-CM

## 2021-07-15 DIAGNOSIS — I2510 Atherosclerotic heart disease of native coronary artery without angina pectoris: Secondary | ICD-10-CM | POA: Diagnosis not present

## 2021-07-15 DIAGNOSIS — E7800 Pure hypercholesterolemia, unspecified: Secondary | ICD-10-CM

## 2021-07-15 DIAGNOSIS — I209 Angina pectoris, unspecified: Secondary | ICD-10-CM

## 2021-07-15 HISTORY — DX: Atherosclerotic heart disease of native coronary artery without angina pectoris: I25.10

## 2021-07-15 HISTORY — DX: Gastro-esophageal reflux disease without esophagitis: K21.9

## 2021-07-15 LAB — CBC WITH DIFFERENTIAL/PLATELET
Basophils Absolute: 0 10*3/uL (ref 0.0–0.2)
Basos: 1 %
EOS (ABSOLUTE): 0.2 10*3/uL (ref 0.0–0.4)
Eos: 2 %
Hematocrit: 40.1 % (ref 34.0–46.6)
Hemoglobin: 12.6 g/dL (ref 11.1–15.9)
Immature Grans (Abs): 0 10*3/uL (ref 0.0–0.1)
Immature Granulocytes: 0 %
Lymphocytes Absolute: 1.9 10*3/uL (ref 0.7–3.1)
Lymphs: 30 %
MCH: 26.8 pg (ref 26.6–33.0)
MCHC: 31.4 g/dL — ABNORMAL LOW (ref 31.5–35.7)
MCV: 85 fL (ref 79–97)
Monocytes Absolute: 0.3 10*3/uL (ref 0.1–0.9)
Monocytes: 5 %
Neutrophils Absolute: 3.9 10*3/uL (ref 1.4–7.0)
Neutrophils: 62 %
Platelets: 283 10*3/uL (ref 150–450)
RBC: 4.71 x10E6/uL (ref 3.77–5.28)
RDW: 14.3 % (ref 11.7–15.4)
WBC: 6.3 10*3/uL (ref 3.4–10.8)

## 2021-07-15 MED ORDER — CARVEDILOL 25 MG PO TABS: 25.0000 mg | ORAL_TABLET | Freq: Two times a day (BID) | ORAL | 3 refills | Status: DC

## 2021-07-15 MED ORDER — PANTOPRAZOLE SODIUM 40 MG PO TBEC: 40.0000 mg | DELAYED_RELEASE_TABLET | Freq: Every day | ORAL | 3 refills | Status: DC

## 2021-07-15 MED ORDER — SODIUM CHLORIDE 0.9% FLUSH: 3.0000 mL | Freq: Two times a day (BID) | INTRAVENOUS | Status: DC

## 2021-07-15 NOTE — Patient Instructions (Addendum)
Medication Instructions:  START PANTOPRAZOLE 40 MG DAILY   STOP METOPROLOL   START CARVEDILOL 25 MG TWICE A DAY    Labwork: CBC TODAY  Testing/Procedures: Your physician has requested that you have a cardiac catheterization. Cardiac catheterization is used to diagnose and/or treat various heart conditions. Doctors may recommend this procedure for a number of different reasons. The most common reason is to evaluate chest pain. Chest pain can be a symptom of coronary artery disease (CAD), and cardiac catheterization can show whether plaque is narrowing or blocking your heart's arteries. This procedure is also used to evaluate the valves, as well as measure the blood flow and oxygen levels in different parts of your heart. For further information please visit [email or web address] Please follow instruction sheet, as given.  Follow-Up: 2 MONTHS WITH DR [facility]   Any Other Special Instructions Will Be Listed Below (If Applicable).  Consider getting the Kardia mobile device to check your heart rhythm when you have palpitations.   [facility] CARDIOLOGY [address] [place] [address] [place] [address] Dept: [phone]  Melissa Casey  07/15/2021  You are scheduled for a Cardiac Catheterization on Thursday, June 22 with Dr. Lance Muss.  1. Please arrive at the Main Entrance A at [facility]: [address] [place], [place] [address] at 5:30 AM (This time is two hours before your procedure to ensure your preparation). Free valet parking service is available.   Special note: Every effort is made to have your procedure done on time. Please understand that emergencies sometimes delay scheduled procedures.  2. Diet: Do not eat solid foods after midnight.  You may have clear liquids until 5 AM upon the day of the procedure.  3. Labs: You will need to have blood drawn TODAY  4. Medication instructions in preparation  for your procedure:   Contrast Allergy: No  HOLD YOUR HYDROCHLOROTHIAZIDE  MORNING OF CATH   Do not take Diabetes Med Janumet (Metformin + Sitagliptin) on the day of the procedure and HOLD 48 HOURS AFTER THE PROCEDURE.  On the morning of your procedure, take Aspirin and any morning medicines NOT listed above.  You may use sips of water.  5. Plan to go home the same day, you will only stay overnight if medically necessary. 6. You MUST have a responsible adult to drive you home. 7. An adult MUST be with you the first 24 hours after you arrive home. 8. Bring a current list of your medications, and the last time and date medication taken. 9. Bring ID and current insurance cards. 10.Please wear clothes that are easy to get on and off and wear slip-on shoes.  Thank you for allowing [place] to care for you!   -- [facility]

## 2021-07-15 NOTE — Assessment & Plan Note (Signed)
Ms. Maes has chest pain when lying down that seems consistent with GERD.  We discussed not eating less than 2 hours before going to bed and sleeping with her head elevated.  We also discussed food triggers.  We have given her pantoprazole 40 mg daily and recommended that she take it 30 minutes prior to eating.  She also has symptoms that are concerning for angina and is undergoing left heart cath as above.

## 2021-07-15 NOTE — Assessment & Plan Note (Signed)
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  On FFR the LAD was indeterminate and the left circumflex was concerning for severe disease.  However it was a small vessel.  Lipids are now well controlled.  She is on aspirin, amlodipine, and beta-blocker and has symptoms that are still concerning for ischemia.  We will plan to proceed with cardiac catheterization.  Switching metoprolol to carvedilol for better blood pressure control as above.

## 2021-07-15 NOTE — Assessment & Plan Note (Signed)
She has been having palpitations but at x0 sustained up to 10 minutes.  This does not only sound like PVCs.  I am concerned she could have atrial fibrillation.  I recommended that she get a Kardia mobile device because it only happened 1 time.  Electrolytes are unremarkable.

## 2021-07-15 NOTE — Assessment & Plan Note (Signed)
Lipids are now well controlled on atorvastatin.  Continue current dose.  We will focus on diet and exercise after her heart cath.

## 2021-07-15 NOTE — Progress Notes (Signed)
Cardiology Office Note:    Date:  07/15/2021   ID:  Melissa Casey, DOB May 17, 1965, MRN [id number]  PCP:  [name], MD  Cardiologist:  Dina Rich, MD    Referring MD: [name], MD   No chief complaint on file.   History of Present Illness:    Melissa Casey is a 56 y.o. female with a hx of hypertension, hyperlipidemia, diabetes milltus type 2, and obesity here today for follow-up. She was initially seen 05/23/2021 for the evaluation and management of chest heaviness at the request of Dr. Loleta Chance. She saw Dr. Loleta Chance 03/2021 and complained of chest discomfort/heaviness with associated sweating, nausea, and L arm pain. The heaviness occurred for 3 nights and each episode lasted 10 minutes. Of note, she endorsed a family history of heart disease in her sister who died of MI at 86 years old. She saw [place], PA-C 03/2017 after presenting to the ED for palpitations and nausea. Monitor showed frequent PVCs in the setting of hypokalemia. She was started on metoprolol tartrate 25 mg BID. She had COVID-19 01/2021. She wore a holter monitor 01/2017 with frequent PVCs and occasional PACs. Echo 03/2017 revealed LVEF 60-65% with normal diastolic function.   At her last appointment, she reported nocturnal chest discomfort with onset 2.5 weeks prior. The discomfort occasionally occurred during the day. She also complained of intermittent left arm tingling with left sided neck and shoulder pain. After walking she would have palpitations and fatigue. At home her BP averaged in the 140's. Telmisartan was increased to 80 mg. To rule out obstructive CAD, a coronary CTA was ordered, which revealed severe CAD in the mid-distal LAD and LCx, CADRADS = 4. Her coronary calcium score was 579.  FFR CT showed significant distal left circumflex disease, but otherwise no clear severe stenoses. Atorvastatin was resumed, and lipids were better controlled on repeat.  Today, she reports feeling a "difference"  in her heart, like it is fluttering and skipping. She denies lightheadedness, although she did feel "a little funny." These episodes typically last for about 5-10 minutes, but do not seem to be very frequent. She remains compliant with her metoprolol. About 3 weeks ago she had an episode of chest discomfort that she believes was related to indigestion. At the time of the discomfort she was working at a computer. 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 While walking, if she feels a brief, sharp chest pain she will sit down to rest. The night before last, she had to sit up while in bed due to a sharp pain in her chest. She has tried taking Pepcid which seemed to help. At home her blood pressure is typically 145/76 on average. This past Monday it was 162/80, but she had forgotten to take her medication. For the past 3 days she has noticed more persistent LE edema. She does not snore, and sometimes wakes up a little fatigued. She denies any shortness of breath. No headaches, syncope, orthopnea, or PND.   Past Medical History:  Diagnosis Date   CAD in native artery 07/15/2021   DVT (deep venous thrombosis) (HCC)    remote past, over 20 years ago, left leg   Essential hypertension, benign    GERD (gastroesophageal reflux disease) 07/15/2021   History of COVID-19 08/2019   History of kidney stones    IBS (irritable bowel syndrome)    Premature ventricular contractions (PVCs) (VPCs)    Per Dr. Loleta Chance   Pure hypercholesterolemia 05/23/2021  Type 2 diabetes mellitus (HCC)     Past Surgical History:  Procedure Laterality Date   COLONOSCOPY N/A 07/26/2015   Procedure: COLONOSCOPY;  Surgeon: [place], MD;  Location: [facility];  Service: Endoscopy;  Laterality: N/A;  130 - moved to 6/26 @1 :30 - office to notify pt   CYSTOSCOPY WITH RETROGRADE PYELOGRAM, URETEROSCOPY AND STENT PLACEMENT Right 12/24/2019   Procedure: CYSTOSCOPY WITH RETROGRADE PYELOGRAM, URETEROSCOPY AND STENT PLACEMENT;  Surgeon:  12/26/2019, MD;  Location: [facility];  Service: Urology;  Laterality: Right;   CYSTOSCOPY/URETEROSCOPY/HOLMIUM LASER/STENT PLACEMENT Right 01/27/2020   Procedure: CYSTOSCOPY/URETEROSCOPY/HOLMIUM LASER/STENT PLACEMENT;  Surgeon: 01/29/2020, MD;  Location: [facility];  Service: Urology;  Laterality: Right;   ESOPHAGOGASTRODUODENOSCOPY N/A 07/26/2015   Procedure: ESOPHAGOGASTRODUODENOSCOPY (EGD);  Surgeon: 07/28/2015, MD;  Location: [facility];  Service: Endoscopy;  Laterality: N/A;    Current Medications: Current Meds  Medication Sig   acetaminophen (TYLENOL) 500 MG tablet Take 1,000 mg by mouth every 6 (six) hours as needed (pain.).   amLODipine (NORVASC) 10 MG tablet Take 10 mg by mouth daily.   aspirin EC 81 MG tablet Take 81 mg by mouth daily. Swallow whole.   atorvastatin (LIPITOR) 10 MG tablet Take 1 tablet (10 mg total) by mouth every evening.   carvedilol (COREG) 25 MG tablet Take 1 tablet (25 mg total) by mouth 2 (two) times daily.   cetirizine (ZYRTEC ALLERGY) 10 MG tablet Take 1 tablet (10 mg total) by mouth daily.   hydrochlorothiazide (HYDRODIURIL) 25 MG tablet Take 25 mg by mouth daily.   ibuprofen (ADVIL,MOTRIN) 800 MG tablet Take 800 mg by mouth every 8 (eight) hours as needed for headache, mild pain or moderate pain.   NOVOLIN N FLEXPEN RELION 100 UNIT/ML Kiwkpen Inject into the skin daily as needed (for blood sugar over 170).   pantoprazole (PROTONIX) 40 MG tablet Take 1 tablet (40 mg total) by mouth daily.   SitaGLIPtin-MetFORMIN HCl (JANUMET XR) [id number] MG TB24 Take 1 tablet by mouth every evening. Patient receives SAMPLES   telmisartan (MICARDIS) 80 MG tablet Take 1 tablet (80 mg total) by mouth daily.   [DISCONTINUED] metoprolol tartrate (LOPRESSOR) 50 MG tablet Take 1 tablet (50 mg total) by mouth 2 (two) times daily.     Allergies:   Patient has no known allergies.   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   Occupation: [employer]     Comment: part time   Occupation:    Tobacco Use   Smoking status: Never   Smokeless tobacco: Never  Vaping Use   Vaping Use: Never used  Substance and Sexual Activity   Alcohol use: No    Alcohol/week: 0.0 standard drinks of alcohol   Drug use: No   Sexual activity: Not on file  Other Topics Concern   Not on file  Social History Narrative   Not on file   Social Determinants of Health   Financial Resource Strain: Low Risk  (05/23/2021)   Overall Financial Resource Strain (CARDIA)    Difficulty of Paying Living Expenses: Not hard at all  Food Insecurity: No Food Insecurity (05/23/2021)  Hunger Vital Sign    Worried About Running Out of Food in the Last Year: Never true    Ran Out of Food in the Last Year: Never true  Transportation Needs: No Transportation Needs (05/23/2021)   PRAPARE - Administrator, Civil Service (Medical): No    Lack of Transportation (Non-Medical): No  Physical Activity: Inactive (05/23/2021)   Exercise Vital Sign    Days of Exercise per Week: 0 days    Minutes of Exercise per Session: 0 min  Stress: Not on file  Social Connections: Not on file     Family History: The patient's family history includes Arrhythmia in her sister; Coronary artery disease in her father and mother; Heart attack (age of onset: 96) in her sister; Heart disease in her brother and father; Hypertension in her brother. There is no history of Colon cancer.  ROS:   Please see the history of present illness.    (+) Palpitations (+) Chest discomfort, sharp pains (+) Fatigue (+) LE edema All other systems reviewed and negative.   EKGs/Labs/Other Studies Reviewed:    The following studies were reviewed today:  CT  FFR Analysis  06/09/2021: FINDINGS: FFRct analysis was performed on the original cardiac CT angiogram dataset. Diagrammatic representation of the FFRct analysis is provided in a separate PDF document in PACS. This dictation was created using the PDF document and an interactive 3D model of the results. 3D model is not available in the EMR/PACS. Normal FFR range is >0.80. Indeterminate (grey) zone is 0.76-0.80.   1. Left Main: FFR = 0.97   2. LAD: Proximal FFR = 0.91, mid FFR = 0.83, Distal FFR = 0.73, D1 FFR = 0.84 3. LCX: Proximal FFR = 0.93, distal FFR = 0.69, OM1 FFR = 0.93 4. RCA: Proximal FFR = 0.95, mid FFR =0.88, distal FFR = 0.80   IMPRESSION: 1. CT FFR analysis showed significant stenosis in the distal LCx lesion, 0.69. The vessel measures approximately 2 mm just proximal to this lesion.   2. Mid-distal LAD lesions does not demonstrate hemodynamically significant stenosis nor significant FFR delta. The distal most LAD has reduced FFR but that may be secondary to vessel tapering and diffuse disease. Vessel is <2 mm distally.  Coronary CT  06/07/2021: FINDINGS: Image quality: Good   Noise artifact is: Significantly reduced signal to noise ratio, affects accuracy of detecting small plaques.   Coronary calcium score is 579, which places the patient in the 99th percentile for age and sex matched control.   Coronary arteries: Normal coronary origins.  Right dominance.   Right Coronary Artery: Small caliber yet dominant RCA, with mild atherosclerotic plaque in the mid and distal RCA, 25-49% stenosis.   Left Main Coronary Artery: No detectable plaque or stenosis.   Left Anterior Descending Coronary Artery: Moderate mixed atherosclerotic plaque in the proximal LAD, mid LAD and proximal first diagonal, 50-69% stenosis. Possible severe stenosis in the mid to distal LAD, 70-99% stenosis.   Left Circumflex Artery: Moderate stenosis in the proximal LCx and possible moderate  stenosis in OM1, 50-69% stenosis. Severe mixed atherosclerotic plaque in the mid-distal LCx, 70-99% stenosis.   Aorta: Normal size, 32 mm at the mid ascending aorta (level of the PA bifurcation) measured double oblique. No calcifications. No dissection.   Aortic Valve: No calcifications.   Other findings:   Normal pulmonary vein drainage into the left atrium.   Normal left atrial appendage without thrombus.   Mild dilation of main pulmonary artery, 30  mm, may indicate pulmonary hypertension.   IMPRESSION: 1. Significantly reduced signal to noise ratio limits the diagnostic quality of this scan.   2. Severe CAD in the mid-distal LAD and LCx, CADRADS = 4. CT FFR will be performed and reported separately.   3. Coronary calcium score is 579, which places the patient in the 99th percentile for age and sex matched control.   4. Normal coronary origins with right dominance.   5. Mild dilation of main pulmonary artery, 30 mm, may indicate pulmonary hypertension.  Echo 03/23/17 - Left ventricle: The cavity size was normal. Wall thickness was    increased in a pattern of mild LVH. Systolic function was normal.    The estimated ejection fraction was in the range of 60% to 65%.    Wall motion was normal; there were no regional wall motion    abnormalities. Left ventricular diastolic function parameters    were normal for the patient&'s age.  - Mitral valve: There was trivial regurgitation.  - Left atrium: The atrium was at the upper limits of normal in    size.  - Right atrium: Central venous pressure (est): 3 mm Hg.  - Tricuspid valve: There was trivial regurgitation.  - Pulmonary arteries: Systolic pressure could not be accurately    estimated.  - Pericardium, extracardiac: There was no pericardial effusion.   Monitor 02/28/17 Sinus rhythm with frequent PVC's and occasional PAC's. No diary was returned.  EKG:  EKG is personally reviewed. 07/15/2021:  EKG was not  ordered. 05/23/21: Sinus rhythm rate 73 bpm; cannot rule out prior inferior infarct and poor R-wave progression  Recent Labs: 07/13/2021: ALT 30; BUN 12; Creatinine, Ser 0.70; Potassium 3.5; Sodium 138   Recent Lipid Panel    Component Value Date/Time   CHOL 123 07/13/2021 0903   TRIG 98 07/13/2021 0903   HDL 47 07/13/2021 0903   CHOLHDL 2.6 07/13/2021 0903   LDLCALC 58 07/13/2021 0903     Physical Exam:    VS:  BP (!) 146/80 (BP Location: Left Arm, Patient Position: Sitting, Cuff Size: Large)   Pulse 70   Ht 5\' 9"  (1.753 m)   Wt 273 lb 12.8 oz (124.2 kg)   LMP 07/24/2015 Comment: currently experiencing cycle at this time  SpO2 93%   BMI 40.43 kg/m  , BMI Body mass index is 40.43 kg/m. GENERAL:  Well appearing HEENT: Pupils equal round and reactive, fundi not visualized, oral mucosa unremarkable NECK:  No jugular venous distention, waveform within normal limits, carotid upstroke brisk and symmetric, no bruits, no thyromegaly LUNGS:  Clear to auscultation bilaterally HEART:  RRR.  PMI not displaced or sustained,S1 and S2 within normal limits, no S3, no S4, no clicks, no rubs, no murmurs ABD:  Flat, positive bowel sounds normal in frequency in pitch, no bruits, no rebound, no guarding, no midline pulsatile mass, no hepatomegaly, no splenomegaly EXT:  2 plus pulses throughout, trace edema, no cyanosis no clubbing SKIN:  No rashes no nodules NEURO:  Cranial nerves II through XII grossly intact, motor grossly intact throughout PSYCH:  Cognitively intact, oriented to person place and time  ASSESSMENT:    1. Pre-procedure lab exam   2. Essential hypertension, benign   3. PVC's (premature ventricular contractions)   4. CAD in native artery   5. Gastroesophageal reflux disease, unspecified whether esophagitis present   6. Pure hypercholesterolemia    PLAN:    Essential hypertension, benign Blood pressure is poorly trolled both here and at  home.  We will switch metoprolol to  carvedilol 25 mg twice daily.  Continue amlodipine, hydrochlorothiazide and telmisartan.  Consider switching to Tribenzor to consolidate her regimen.  PVC's (premature ventricular contractions) She has been having palpitations but at x0 sustained up to 10 minutes.  This does not only sound like PVCs.  I am concerned she could have atrial fibrillation.  I recommended that she get a Kardia mobile device because it only happened 1 time.  Electrolytes are unremarkable.  CAD in native artery Coronary CT was concerning for severe mid LAD stenosis and severe left circumflex stenosis.  On FFR the LAD was indeterminate and the left circumflex was concerning for severe disease.  However it was a small vessel.  Lipids are now well controlled.  She is on aspirin, amlodipine, and beta-blocker and has symptoms that are still concerning for ischemia.  We will plan to proceed with cardiac catheterization.  Switching metoprolol to carvedilol for better blood pressure control as above.  GERD (gastroesophageal reflux disease) Ms. Bottino has chest pain when lying down that seems consistent with GERD.  We discussed not eating less than 2 hours before going to bed and sleeping with her head elevated.  We also discussed food triggers.  We have given her pantoprazole 40 mg daily and recommended that she take it 30 minutes prior to eating.  She also has symptoms that are concerning for angina and is undergoing left heart cath as above.  Pure hypercholesterolemia Lipids are now well controlled on atorvastatin.  Continue current dose.  We will focus on diet and exercise after her heart cath.   Medication Adjustments/Labs and Tests Ordered: Current medicines are reviewed at length with the patient today.  Concerns regarding medicines are outlined above.   Orders Placed This Encounter  Procedures   CBC with Differential/Platelet   Meds ordered this encounter  Medications   carvedilol (COREG) 25 MG tablet    Sig: Take 1  tablet (25 mg total) by mouth 2 (two) times daily.    Dispense:  180 tablet    Refill:  3    D/C METOPROLOL   pantoprazole (PROTONIX) 40 MG tablet    Sig: Take 1 tablet (40 mg total) by mouth daily.    Dispense:  30 tablet    Refill:  3   Disposition: FU with Chasyn Cinque C. Oval Linsey, MD, [facility] in 2 months  I,Mathew Stumpf,acting as a Education administrator for Skeet Latch, MD.,have documented all relevant documentation on the behalf of Skeet Latch, MD,as directed by  Skeet Latch, MD while in the presence of Skeet Latch, MD.  I, [place] Oval Linsey, MD have reviewed all documentation for this visit.  The documentation of the exam, diagnosis, procedures, and orders on 07/15/2021 are all accurate and complete.  Signed, Skeet Latch, MD  07/15/2021 1:12 PM    [facility]

## 2021-07-15 NOTE — Assessment & Plan Note (Signed)
Blood pressure is poorly trolled both here and at home.  We will switch metoprolol to carvedilol 25 mg twice daily.  Continue amlodipine, hydrochlorothiazide and telmisartan.  Consider switching to Tribenzor to consolidate her regimen.

## 2021-07-20 ENCOUNTER — Telehealth: Payer: Self-pay | Admitting: *Deleted

## 2021-07-20 NOTE — Telephone Encounter (Addendum)
Cardiac Catheterization scheduled at [facility] for: Thursday July 21, 2021 7:30 AM Arrival time and place: [facility] Main Entrance A at: 5:30 AM   Nothing to eat after midnight prior to procedure, clear liquids until 5 AM day of procedure.  Medication instructions: -Hold:  HCTZ-AM of procedure  Janumet XR-day of procedure and 48 hours post procedure  Patient reports she only takes Insulin prn-knows not to use AM of procedure -Except hold medications usual morning medications can be taken with sips of water including aspirin 81 mg.  Confirmed patient has responsible adult to drive home post procedure and be with patient first 24 hours after arriving home.  Patient reports no new symptoms concerning for COVID-19 in the past 10 days.  Reviewed procedure instructions with patient.

## 2021-07-21 ENCOUNTER — Encounter (HOSPITAL_COMMUNITY): Payer: Self-pay | Admitting: Interventional Cardiology

## 2021-07-21 ENCOUNTER — Ambulatory Visit (HOSPITAL_COMMUNITY)
Admission: RE | Admit: 2021-07-21 | Discharge: 2021-07-21 | Disposition: A | Discharge status: Home / Self Care | Payer: BC Managed Care – PPO | Attending: Interventional Cardiology | Admitting: Interventional Cardiology

## 2021-07-21 ENCOUNTER — Encounter (HOSPITAL_COMMUNITY)
Admission: RE | Disposition: A | Discharge status: Home / Self Care | Payer: Self-pay | Source: Home / Self Care | Attending: Interventional Cardiology

## 2021-07-21 ENCOUNTER — Other Ambulatory Visit (HOSPITAL_COMMUNITY): Payer: Self-pay

## 2021-07-21 ENCOUNTER — Other Ambulatory Visit: Payer: Self-pay

## 2021-07-21 DIAGNOSIS — I493 Ventricular premature depolarization: Secondary | ICD-10-CM | POA: Insufficient documentation

## 2021-07-21 DIAGNOSIS — Z794 Long term (current) use of insulin: Secondary | ICD-10-CM | POA: Diagnosis not present

## 2021-07-21 DIAGNOSIS — Z7984 Long term (current) use of oral hypoglycemic drugs: Secondary | ICD-10-CM | POA: Insufficient documentation

## 2021-07-21 DIAGNOSIS — I25119 Atherosclerotic heart disease of native coronary artery with unspecified angina pectoris: Secondary | ICD-10-CM

## 2021-07-21 DIAGNOSIS — E669 Obesity, unspecified: Secondary | ICD-10-CM | POA: Insufficient documentation

## 2021-07-21 DIAGNOSIS — K219 Gastro-esophageal reflux disease without esophagitis: Secondary | ICD-10-CM | POA: Diagnosis not present

## 2021-07-21 DIAGNOSIS — Z6841 Body Mass Index (BMI) 40.0 and over, adult: Secondary | ICD-10-CM | POA: Insufficient documentation

## 2021-07-21 DIAGNOSIS — E7800 Pure hypercholesterolemia, unspecified: Secondary | ICD-10-CM | POA: Diagnosis present

## 2021-07-21 DIAGNOSIS — I10 Essential (primary) hypertension: Secondary | ICD-10-CM | POA: Insufficient documentation

## 2021-07-21 DIAGNOSIS — Z955 Presence of coronary angioplasty implant and graft: Secondary | ICD-10-CM

## 2021-07-21 DIAGNOSIS — Z7982 Long term (current) use of aspirin: Secondary | ICD-10-CM | POA: Diagnosis not present

## 2021-07-21 DIAGNOSIS — Z79899 Other long term (current) drug therapy: Secondary | ICD-10-CM | POA: Diagnosis not present

## 2021-07-21 DIAGNOSIS — Z8249 Family history of ischemic heart disease and other diseases of the circulatory system: Secondary | ICD-10-CM | POA: Diagnosis not present

## 2021-07-21 DIAGNOSIS — E119 Type 2 diabetes mellitus without complications: Secondary | ICD-10-CM | POA: Diagnosis not present

## 2021-07-21 DIAGNOSIS — I209 Angina pectoris, unspecified: Secondary | ICD-10-CM

## 2021-07-21 DIAGNOSIS — I1A0 Resistant hypertension: Secondary | ICD-10-CM | POA: Diagnosis present

## 2021-07-21 HISTORY — PX: CORONARY STENT INTERVENTION: CATH118234

## 2021-07-21 HISTORY — PX: LEFT HEART CATH AND CORONARY ANGIOGRAPHY: CATH118249

## 2021-07-21 LAB — GLUCOSE, CAPILLARY
Glucose-Capillary: 192 mg/dL — ABNORMAL HIGH (ref 70–99)
Glucose-Capillary: 189 mg/dL — ABNORMAL HIGH (ref 70–99)

## 2021-07-21 LAB — POCT ACTIVATED CLOTTING TIME
Activated Clotting Time: 269 seconds
Activated Clotting Time: 293 seconds

## 2021-07-21 SURGERY — LEFT HEART CATH AND CORONARY ANGIOGRAPHY
Anesthesia: LOCAL

## 2021-07-21 MED ORDER — IRBESARTAN 150 MG PO TABS: 150.0000 mg | ORAL_TABLET | Freq: Every day | ORAL | Status: DC

## 2021-07-21 MED ORDER — HEPARIN (PORCINE) IN NACL 1000-0.9 UT/500ML-% IV SOLN
INTRAVENOUS | Status: DC | PRN
  Administered 2021-07-21 (×2): 500 mL

## 2021-07-21 MED ORDER — SODIUM CHLORIDE 0.9 % IV SOLN: INTRAVENOUS | Status: AC

## 2021-07-21 MED ORDER — MIDAZOLAM HCL 2 MG/2ML IJ SOLN
INTRAMUSCULAR | Status: DC | PRN
  Administered 2021-07-21: 1 mg via INTRAVENOUS
  Administered 2021-07-21: 2 mg via INTRAVENOUS

## 2021-07-21 MED ORDER — MIDAZOLAM HCL 2 MG/2ML IJ SOLN
INTRAMUSCULAR | Status: AC
  Filled 2021-07-21: qty 2

## 2021-07-21 MED ORDER — SODIUM CHLORIDE 0.9% FLUSH: 3.0000 mL | Freq: Two times a day (BID) | INTRAVENOUS | Status: DC

## 2021-07-21 MED ORDER — HEPARIN SODIUM (PORCINE) 1000 UNIT/ML IJ SOLN
INTRAMUSCULAR | Status: AC
  Filled 2021-07-21: qty 10

## 2021-07-21 MED ORDER — FENTANYL CITRATE (PF) 100 MCG/2ML IJ SOLN
INTRAMUSCULAR | Status: AC
  Filled 2021-07-21: qty 2

## 2021-07-21 MED ORDER — ASPIRIN 81 MG PO TBEC: 81.0000 mg | DELAYED_RELEASE_TABLET | Freq: Every day | ORAL | Status: DC

## 2021-07-21 MED ORDER — PANTOPRAZOLE SODIUM 40 MG PO TBEC: 40.0000 mg | DELAYED_RELEASE_TABLET | Freq: Every day | ORAL | Status: DC

## 2021-07-21 MED ORDER — SODIUM CHLORIDE 0.9% FLUSH: 3.0000 mL | INTRAVENOUS | Status: DC | PRN

## 2021-07-21 MED ORDER — INSULIN ISOPHANE HUMAN 100 UNIT/ML KWIKPEN: 3.0000 [IU] | PEN_INJECTOR | Freq: Every day | SUBCUTANEOUS | Status: DC | PRN

## 2021-07-21 MED ORDER — HEPARIN (PORCINE) IN NACL 1000-0.9 UT/500ML-% IV SOLN
INTRAVENOUS | Status: AC
  Filled 2021-07-21: qty 500

## 2021-07-21 MED ORDER — ASPIRIN 81 MG PO CHEW: 81.0000 mg | CHEWABLE_TABLET | ORAL | Status: DC

## 2021-07-21 MED ORDER — VERAPAMIL HCL 2.5 MG/ML IV SOLN
INTRAVENOUS | Status: AC
  Filled 2021-07-21: qty 2

## 2021-07-21 MED ORDER — CLOPIDOGREL BISULFATE 300 MG PO TABS
ORAL_TABLET | ORAL | Status: AC
  Filled 2021-07-21: qty 1

## 2021-07-21 MED ORDER — IOHEXOL 350 MG/ML SOLN
INTRAVENOUS | Status: DC | PRN
  Administered 2021-07-21: 115 mL

## 2021-07-21 MED ORDER — ONDANSETRON HCL 4 MG/2ML IJ SOLN: 4.0000 mg | Freq: Four times a day (QID) | INTRAMUSCULAR | Status: DC | PRN

## 2021-07-21 MED ORDER — CLOPIDOGREL BISULFATE 75 MG PO TABS: 75.0000 mg | ORAL_TABLET | Freq: Every day | ORAL | Status: DC

## 2021-07-21 MED ORDER — LORATADINE 10 MG PO TABS: 10.0000 mg | ORAL_TABLET | Freq: Every day | ORAL | Status: DC

## 2021-07-21 MED ORDER — SODIUM CHLORIDE 0.9 % IV SOLN: 250.0000 mL | INTRAVENOUS | Status: DC | PRN

## 2021-07-21 MED ORDER — SODIUM CHLORIDE 0.9 % WEIGHT BASED INFUSION
3.0000 mL/kg/h | INTRAVENOUS | Status: AC
  Administered 2021-07-21: 3 mL/kg/h via INTRAVENOUS

## 2021-07-21 MED ORDER — HYDROCHLOROTHIAZIDE 25 MG PO TABS: 25.0000 mg | ORAL_TABLET | Freq: Every day | ORAL | Status: DC

## 2021-07-21 MED ORDER — ATORVASTATIN CALCIUM 40 MG PO TABS
40.0000 mg | ORAL_TABLET | Freq: Every evening | ORAL | 11 refills | Status: DC
  Filled 2021-07-21: qty 30, 30d supply, fill #0

## 2021-07-21 MED ORDER — VERAPAMIL HCL 2.5 MG/ML IV SOLN
INTRAVENOUS | Status: DC | PRN
  Administered 2021-07-21 (×2): 10 mL via INTRA_ARTERIAL

## 2021-07-21 MED ORDER — FAMOTIDINE IN NACL 20-0.9 MG/50ML-% IV SOLN
INTRAVENOUS | Status: AC
  Filled 2021-07-21: qty 50

## 2021-07-21 MED ORDER — NITROGLYCERIN 0.4 MG SL SUBL
0.4000 mg | SUBLINGUAL_TABLET | SUBLINGUAL | 2 refills | Status: AC | PRN
  Filled 2021-07-21: qty 25, 7d supply, fill #0

## 2021-07-21 MED ORDER — LABETALOL HCL 5 MG/ML IV SOLN: 10.0000 mg | INTRAVENOUS | Status: DC | PRN

## 2021-07-21 MED ORDER — NITROGLYCERIN 1 MG/10 ML FOR IR/CATH LAB
INTRA_ARTERIAL | Status: AC
  Filled 2021-07-21: qty 10

## 2021-07-21 MED ORDER — FENTANYL CITRATE (PF) 100 MCG/2ML IJ SOLN
INTRAMUSCULAR | Status: DC | PRN
  Administered 2021-07-21 (×3): 25 ug via INTRAVENOUS

## 2021-07-21 MED ORDER — AMLODIPINE BESYLATE 5 MG PO TABS: 10.0000 mg | ORAL_TABLET | Freq: Every day | ORAL | Status: DC

## 2021-07-21 MED ORDER — LIDOCAINE HCL (PF) 1 % IJ SOLN
INTRAMUSCULAR | Status: DC | PRN
  Administered 2021-07-21: 2 mL

## 2021-07-21 MED ORDER — ACETAMINOPHEN 325 MG PO TABS: 650.0000 mg | ORAL_TABLET | ORAL | Status: DC | PRN

## 2021-07-21 MED ORDER — CLOPIDOGREL BISULFATE 300 MG PO TABS
ORAL_TABLET | ORAL | Status: DC | PRN
  Administered 2021-07-21: 600 mg via ORAL

## 2021-07-21 MED ORDER — ATORVASTATIN CALCIUM 10 MG PO TABS: 10.0000 mg | ORAL_TABLET | Freq: Every evening | ORAL | Status: DC

## 2021-07-21 MED ORDER — NITROGLYCERIN 1 MG/10 ML FOR IR/CATH LAB
INTRA_ARTERIAL | Status: DC | PRN
  Administered 2021-07-21: 500 ug via INTRA_ARTERIAL

## 2021-07-21 MED ORDER — CLOPIDOGREL BISULFATE 75 MG PO TABS
75.0000 mg | ORAL_TABLET | Freq: Every day | ORAL | 5 refills | Status: DC
  Filled 2021-07-21: qty 30, 30d supply, fill #0

## 2021-07-21 MED ORDER — ACETAMINOPHEN 500 MG PO TABS
1000.0000 mg | ORAL_TABLET | Freq: Four times a day (QID) | ORAL | Status: DC | PRN
Start: 2021-07-21 — End: 2021-07-21

## 2021-07-21 MED ORDER — CARVEDILOL 25 MG PO TABS: 25.0000 mg | ORAL_TABLET | Freq: Two times a day (BID) | ORAL | Status: DC

## 2021-07-21 MED ORDER — ASPIRIN 81 MG PO CHEW: 81.0000 mg | CHEWABLE_TABLET | Freq: Every day | ORAL | Status: DC

## 2021-07-21 MED ORDER — SODIUM CHLORIDE 0.9 % WEIGHT BASED INFUSION: 1.0000 mL/kg/h | INTRAVENOUS | Status: DC

## 2021-07-21 MED ORDER — LIDOCAINE HCL (PF) 1 % IJ SOLN
INTRAMUSCULAR | Status: AC
  Filled 2021-07-21: qty 30

## 2021-07-21 MED ORDER — HYDRALAZINE HCL 20 MG/ML IJ SOLN: 10.0000 mg | INTRAMUSCULAR | Status: DC | PRN

## 2021-07-21 MED ORDER — HEPARIN SODIUM (PORCINE) 1000 UNIT/ML IJ SOLN
INTRAMUSCULAR | Status: DC | PRN
  Administered 2021-07-21: 5000 [IU] via INTRAVENOUS
  Administered 2021-07-21: 3000 [IU] via INTRAVENOUS
  Administered 2021-07-21: 7000 [IU] via INTRAVENOUS

## 2021-07-21 MED ORDER — FAMOTIDINE IN NACL 20-0.9 MG/50ML-% IV SOLN
INTRAVENOUS | Status: DC | PRN
  Administered 2021-07-21: 20 mg via INTRAVENOUS

## 2021-07-21 SURGICAL SUPPLY — 22 items
BALL SAPPHIRE NC24 2.50X12 (BALLOONS) ×2
BALLN SAPPHIRE 2.0X15 (BALLOONS) ×2
BALLOON SAPPHIRE 2.0X15 (BALLOONS) IMPLANT
BALLOON SAPPHIRE NC24 2.50X12 (BALLOONS) IMPLANT
BAND CMPR LRG ZPHR (HEMOSTASIS) ×1
BAND ZEPHYR COMPRESS 30 LONG (HEMOSTASIS) ×1 IMPLANT
CATH 5FR JL3.5 JR4 ANG PIG MP (CATHETERS) ×1 IMPLANT
CATH LAUNCHER 6FR EBU3.5 (CATHETERS) ×1 IMPLANT
GLIDESHEATH SLEND SS 6F .021 (SHEATH) ×1 IMPLANT
GUIDEWIRE INQWIRE 1.5J.035X260 (WIRE) IMPLANT
INQWIRE 1.5J .035X260CM (WIRE) ×2
KIT ENCORE 26 ADVANTAGE (KITS) ×1 IMPLANT
KIT HEART LEFT (KITS) ×2 IMPLANT
PACK CARDIAC CATHETERIZATION (CUSTOM PROCEDURE TRAY) ×2 IMPLANT
SHEATH PROBE COVER 6X72 (BAG) ×1 IMPLANT
STENT SYNERGY XD 2.25X20 (Permanent Stent) IMPLANT
SYNERGY XD 2.25X20 (Permanent Stent) ×2 IMPLANT
SYR MEDRAD MARK 7 150ML (SYRINGE) ×2 IMPLANT
TRANSDUCER W/STOPCOCK (MISCELLANEOUS) ×2 IMPLANT
TUBING CIL FLEX 10 FLL-RA (TUBING) ×2 IMPLANT
VALVE COPILOT STAT (MISCELLANEOUS) ×1 IMPLANT
WIRE ASAHI PROWATER 180CM (WIRE) ×1 IMPLANT

## 2021-07-21 NOTE — Interval H&P Note (Signed)
Cath Lab Visit (complete for each Cath Lab visit)  Clinical Evaluation Leading to the Procedure:   ACS: No.  Non-ACS:    Anginal Classification: CCS III  Anti-ischemic medical therapy: Minimal Therapy (1 class of medications)  Non-Invasive Test Results: Intermediate-risk stress test findings: cardiac mortality 1-3%/year  Prior CABG: No previous CABG      History and Physical Interval Note:  07/21/2021 7:41 AM  Melissa Casey  has presented today for surgery, with the diagnosis of chest pain, abnormal ct.  The various methods of treatment have been discussed with the patient and family. After consideration of risks, benefits and other options for treatment, the patient has consented to  Procedure(s): LEFT HEART CATH AND CORONARY ANGIOGRAPHY (N/A) as a surgical intervention.  The patient's history has been reviewed, patient examined, no change in status, stable for surgery.  I have reviewed the patient's chart and labs.  Questions were answered to the patient's satisfaction.     Lance Muss

## 2021-07-21 NOTE — Discharge Summary (Addendum)
Discharge Summary for Same Day PCI   Patient ID: Melissa Casey MRN: [id number]; DOB: 08/20/1965  Admit date: 07/21/2021 Discharge date: 07/21/2021  Primary Care Provider: Iona Beard, MD  Primary Cardiologist: Carlyle Dolly, MD  Primary Electrophysiologist:  None   Discharge Diagnoses    Active Problems:   Angina pectoris [facility])   Essential hypertension, benign   Pure hypercholesterolemia  Diagnostic Studies/Procedures    Cardiac Catheterization 07/21/2021:  Mid Cx lesion is 25% stenosed.   2nd Diag lesion is 50% stenosed.   Mid LAD lesion is 25% stenosed.   Mid Cx to Dist Cx lesion is 75% stenosed.  A drug-eluting stent was successfully placed using a SYNERGY XD 2.25X20, post dilated to 2.5 mm.   Post intervention, there is a 0% residual stenosis.   The left ventricular systolic function is normal.   LV end diastolic pressure is mildly elevated.   The left ventricular ejection fraction is 55-65% by visual estimate.   There is no aortic valve stenosis.   Severe right subclavian tortuosity.  If emergent cath was needed, would not use right radial approach in the future.  May need to consider using destination sheath to help straighten the tortuosity.   Successful PCI of the distal circumflex.  Continue dual antiplatelet therapy for at least 6 months.  Consider clopidogrel monotherapy after that time.  Continue aggressive secondary prevention and risk factor management.   Plan for same-day discharge.  Results conveyed to the son, Quita Skye [id number].   Diagnostic Dominance: Right  Intervention   _____________   History of Present Illness     Melissa Casey is a 56 y.o. female with a hx of hypertension, hyperlipidemia, diabetes milltus type 2, and obesity here today for follow-up. She was initially seen 05/23/2021 for the evaluation and management of chest heaviness at the request of Dr. Berdine Addison. She saw Dr. Berdine Addison 03/2021 and complained of chest discomfort/heaviness with  associated sweating, nausea, and L arm pain. The heaviness occurred for 3 nights and each episode lasted 10 minutes. Of note, she endorsed a family history of heart disease in her sister who died of MI at 55 years old. She saw [place], PA-C 03/2017 after presenting to the ED for palpitations and nausea. Monitor showed frequent PVCs in the setting of hypokalemia. She was started on metoprolol tartrate 25 mg BID. She had COVID-19 01/2021. She wore a holter monitor 01/2017 with frequent PVCs and occasional PACs. Echo 03/2017 revealed LVEF 60-65% with normal diastolic function.    At a recent office visit she reported nocturnal chest discomfort with onset 2.5 weeks prior. The discomfort occasionally occurred during the day. She also complained of intermittent left arm tingling with left sided neck and shoulder pain. After walking she would have palpitations and fatigue. At home her BP averaged in the 140's. Telmisartan was increased to 80 mg. To rule out obstructive CAD, a coronary CTA was ordered, which revealed severe CAD in the mid-distal LAD and LCx, CADRADS = 4. Her coronary calcium score was 579.  FFR CT showed significant distal left circumflex disease, but otherwise no clear severe stenoses. Atorvastatin was resumed, and lipids were better controlled on repeat.   At office visit on 6/16 she reported feeling a "difference" in her heart, like was fluttering and skipping. She denied lightheadedness, although she did feel "a little funny." These episodes typically lasted for about 5-10 minutes, but do not seem to be very frequent. She remained compliant with her metoprolol. About 3 weeks ago  she had an episode of chest discomfort that she believed was related to indigestion. At the time of the discomfort she was working at a computer. 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 While walking, if she feels a brief, sharp chest pain she will sit down to rest. The night before, she had to sit up while in bed  due to a sharp pain in her chest. She has tried taking Pepcid which seemed to help. At home her blood pressure is typically 145/76 on average. For the past 3 days she has noticed more persistent LE edema.   Underwent outpatient coronary CT which was concerning for severe mLAD stenosis along with Lcx disease.   On FFR the LAD was indeterminate and the left circumflex was concerning for severe disease. Given her ongoing symptoms she was set up for outpatient cardiac cath.    Hospital Course     The patient underwent cardiac cath as noted above with dLCx stenosis of 75% with PCI/DES x1, mLcx 25%, 2nd Diag 50%, mLAD 25%. Plan for DAPT with ASA/plavix for at least 6 months. The patient was seen by cardiac rehab while in short stay. There were no observed complications post cath. Radial cath site was re-evaluated prior to discharge and found to be stable without any complications. Instructions/precautions regarding cath site care were given prior to discharge.  Melissa Casey was seen by Dr. Duke Salvia and determined stable for discharge home. Follow up with our office has been arranged. Medications are listed below. Pertinent changes include addition of plavix/ASA along with increase in atorvastatin from 10mg  to 40mg  daily. _____________  Cath/PCI Registry Performance & Quality Measures: Aspirin prescribed? - Yes ADP Receptor Inhibitor (Plavix/Clopidogrel, Brilinta/Ticagrelor or Effient/Prasugrel) prescribed (includes medically managed patients)? - Yes High Intensity Statin (Lipitor 40-80mg  or Crestor 20-40mg ) prescribed? - Yes For EF <40%, was ACEI/ARB prescribed? - Not Applicable (EF >/= 40%) For EF <40%, Aldosterone Antagonist (Spironolactone or Eplerenone) prescribed? - Not Applicable (EF >/= 40%) Cardiac Rehab Phase II ordered (Included Medically managed Patients)? - Yes  _____________   Discharge Vitals Blood pressure (!) 156/81, pulse 67, temperature 97.8 F (36.6 C), temperature source  Temporal, resp. rate 20, height 5\' 9"  (1.753 m), weight 121.6 kg, last menstrual period 07/24/2015, SpO2 97 %.  Filed Weights   07/21/21 0612  Weight: 121.6 kg    Last Labs & Radiologic Studies    CBC No results for input(s): "WBC", "NEUTROABS", "HGB", "HCT", "MCV", "PLT" in the last 72 hours. Basic Metabolic Panel No results for input(s): "NA", "K", "CL", "CO2", "GLUCOSE", "BUN", "CREATININE", "CALCIUM", "MG", "PHOS" in the last 72 hours. Liver Function Tests No results for input(s): "AST", "ALT", "ALKPHOS", "BILITOT", "PROT", "ALBUMIN" in the last 72 hours. No results for input(s): "LIPASE", "AMYLASE" in the last 72 hours. High Sensitivity Troponin:   No results for input(s): "TROPONINIHS" in the last 720 hours.  BNP Invalid input(s): "POCBNP" D-Dimer No results for input(s): "DDIMER" in the last 72 hours. Hemoglobin A1C No results for input(s): "HGBA1C" in the last 72 hours. Fasting Lipid Panel No results for input(s): "CHOL", "HDL", "LDLCALC", "TRIG", "CHOLHDL", "LDLDIRECT" in the last 72 hours. Thyroid Function Tests No results for input(s): "TSH", "T4TOTAL", "T3FREE", "THYROIDAB" in the last 72 hours.  Invalid input(s): "FREET3" _____________  CARDIAC CATHETERIZATION  Result Date: 07/21/2021   Mid Cx lesion is 25% stenosed.   2nd Diag lesion is 50% stenosed.   Mid LAD lesion is 25% stenosed.   Mid Cx to Dist Cx  lesion is 75% stenosed.  A drug-eluting stent was successfully placed using a SYNERGY XD 2.25X20, post dilated to 2.5 mm.   Post intervention, there is a 0% residual stenosis.   The left ventricular systolic function is normal.   LV end diastolic pressure is mildly elevated.   The left ventricular ejection fraction is 55-65% by visual estimate.   There is no aortic valve stenosis.   Severe right subclavian tortuosity.  If emergent cath was needed, would not use right radial approach in the future.  May need to consider using destination sheath to help straighten the  tortuosity. Successful PCI of the distal circumflex.  Continue dual antiplatelet therapy for at least 6 months.  Consider clopidogrel monotherapy after that time.  Continue aggressive secondary prevention and risk factor management. Plan for same-day discharge.  Results conveyed to the son, Madelaine Bhat [phone].    Disposition   Pt is being discharged home today in good condition.  Follow-up Plans & Appointments     Follow-up Information     Alver Sorrow, NP Follow up on 07/28/2021.   Specialty: Cardiology Why: at 2:20pm for your follow up appt with Dr. Sunday Spillers' NP Ernestina Patches information: [address] [place] [address] [phone]                Discharge Instructions     Amb Referral to Cardiac Rehabilitation   Complete by: As directed    Diagnosis: Coronary Stents   After initial evaluation and assessments completed: Virtual Based Care may be provided alone or in conjunction with Phase 2 Cardiac Rehab based on patient barriers.: Yes        Discharge Medications   Allergies as of 07/21/2021   No Known Allergies      Medication List     TAKE these medications    acetaminophen 500 MG tablet Commonly known as: TYLENOL Take 1,000 mg by mouth every 6 (six) hours as needed for moderate pain.   amLODipine 10 MG tablet Commonly known as: NORVASC Take 10 mg by mouth daily.   aspirin EC 81 MG tablet Take 81 mg by mouth daily. Swallow whole.   atorvastatin 40 MG tablet Commonly known as: LIPITOR Take 1 tablet (40 mg total) by mouth every evening. What changed:  medication strength how much to take   carvedilol 25 MG tablet Commonly known as: COREG Take 1 tablet (25 mg total) by mouth 2 (two) times daily.   cetirizine 10 MG tablet Commonly known as: ZyrTEC Allergy Take 1 tablet (10 mg total) by mouth daily. What changed:  when to take this reasons to take this   clopidogrel 75 MG tablet Commonly known as: Plavix Take 1 tablet (75  mg total) by mouth daily.   hydrochlorothiazide 25 MG tablet Commonly known as: HYDRODIURIL Take 25 mg by mouth daily.   ibuprofen 800 MG tablet Commonly known as: ADVIL Take 800 mg by mouth every 8 (eight) hours as needed for headache, mild pain or moderate pain.   Janumet XR [id number] MG Tb24 Generic drug: SitaGLIPtin-MetFORMIN HCl Take 1 tablet by mouth at bedtime. Patient receives SAMPLES   nitroGLYCERIN 0.4 MG SL tablet Commonly known as: NITROSTAT Place 1 tablet (0.4 mg total) under the tongue every 5 (five) minutes as needed.   NovoLIN N FlexPen 100 UNIT/ML Kiwkpen Generic drug: Insulin NPH (Human) (Isophane) Inject 3 Units into the skin daily as needed (For blood sugar over 180).   pantoprazole 40 MG tablet Commonly known as: PROTONIX Take  1 tablet (40 mg total) by mouth daily.   telmisartan 80 MG tablet Commonly known as: MICARDIS Take 1 tablet (80 mg total) by mouth daily.        Allergies No Known Allergies  Outstanding Labs/Studies   FLP/LFTs in 8 weeks   Duration of Discharge Encounter   Greater than 30 minutes including physician time.  Signed, Reino Bellis, NP 07/21/2021, 1:16 PM  I have examined the patient and reviewed assessment and plan and discussed with patient.  Agree with above as stated.  Wrist precautions given.  Spoke to family regarding need for DAPT with clopidogrel.  COntinue aggressive secondary prevention.    Larae Grooms

## 2021-07-21 NOTE — Discharge Instructions (Addendum)
NO JANUMET FOR 2 DAYS °

## 2021-07-21 NOTE — Progress Notes (Signed)
Up and walked and tolerated well; no c/o chest pain or shortness of breath 

## 2021-07-21 NOTE — Progress Notes (Signed)
Laverda Page, NP in to check client and ok to d/c home

## 2021-07-21 NOTE — Progress Notes (Signed)
Seen pt from [phone] pt was educated on stent procedure, restrictions, Aspirin & Plavix & med use, ex guidelines, NTG use, heart healthy diet, and CRPII. Pt is being referred to [facility] in [place]  Faustino Congress 07/21/2021 10:42 AM .

## 2021-07-28 ENCOUNTER — Encounter (HOSPITAL_BASED_OUTPATIENT_CLINIC_OR_DEPARTMENT_OTHER): Payer: Self-pay | Admitting: Family

## 2021-07-28 ENCOUNTER — Ambulatory Visit (INDEPENDENT_AMBULATORY_CARE_PROVIDER_SITE_OTHER): Payer: BC Managed Care – PPO | Admitting: Family

## 2021-07-28 VITALS — BP 148/78 | HR 70 | Ht 69.0 in | Wt 275.0 lb

## 2021-07-28 DIAGNOSIS — I10 Essential (primary) hypertension: Secondary | ICD-10-CM | POA: Diagnosis not present

## 2021-07-28 DIAGNOSIS — I25118 Atherosclerotic heart disease of native coronary artery with other forms of angina pectoris: Secondary | ICD-10-CM | POA: Diagnosis not present

## 2021-07-28 DIAGNOSIS — I493 Ventricular premature depolarization: Secondary | ICD-10-CM

## 2021-07-28 DIAGNOSIS — E785 Hyperlipidemia, unspecified: Secondary | ICD-10-CM | POA: Diagnosis not present

## 2021-07-28 MED ORDER — TELMISARTAN 80 MG PO TABS: 80.0000 mg | ORAL_TABLET | Freq: Every day | ORAL | 3 refills | Status: DC

## 2021-07-28 MED ORDER — HYDRALAZINE HCL 25 MG PO TABS: 25.0000 mg | ORAL_TABLET | Freq: Two times a day (BID) | ORAL | 3 refills | Status: DC

## 2021-07-28 NOTE — Progress Notes (Unsigned)
Office Visit    Patient Name: Melissa Casey Date of Encounter: 07/30/2021  PCP:  [name], MD   [facility]  Cardiologist:  Chilton Si, MD  Advanced Practice Provider:  No care team member to display Electrophysiologist:  None      Chief Complaint    MERIAN WROE is a 56 y.o. female presents today for follow-up after LHC and stent  Past Medical History    Past Medical History:  Diagnosis Date   CAD in native artery 07/15/2021   DVT (deep venous thrombosis) (HCC)    remote past, over 20 years ago, left leg   Essential hypertension, benign    GERD (gastroesophageal reflux disease) 07/15/2021   History of COVID-19 08/2019   History of kidney stones    IBS (irritable bowel syndrome)    Premature ventricular contractions (PVCs) (VPCs)    Per Dr. Loleta Chance   Pure hypercholesterolemia 05/23/2021   Type 2 diabetes mellitus (HCC)    Past Surgical History:  Procedure Laterality Date   COLONOSCOPY N/A 07/26/2015   Procedure: COLONOSCOPY;  Surgeon: West Bali, MD;  Location: [facility];  Service: Endoscopy;  Laterality: N/A;  130 - moved to 6/26 @1 :30 - office to notify pt   CORONARY STENT INTERVENTION N/A 07/21/2021   Procedure: CORONARY STENT INTERVENTION;  Surgeon: 07/23/2021, MD;  Location: [facility] INVASIVE CV LAB;  Service: Cardiovascular;  Laterality: N/A;   CYSTOSCOPY WITH RETROGRADE PYELOGRAM, URETEROSCOPY AND STENT PLACEMENT Right 12/24/2019   Procedure: CYSTOSCOPY WITH RETROGRADE PYELOGRAM, URETEROSCOPY AND STENT PLACEMENT;  Surgeon: 12/26/2019, MD;  Location: [facility] ORS;  Service: Urology;  Laterality: Right;   CYSTOSCOPY/URETEROSCOPY/HOLMIUM LASER/STENT PLACEMENT Right 01/27/2020   Procedure: CYSTOSCOPY/URETEROSCOPY/HOLMIUM LASER/STENT PLACEMENT;  Surgeon: 01/29/2020, MD;  Location: [facility]S;  Service: Urology;  Laterality: Right;   ESOPHAGOGASTRODUODENOSCOPY N/A 07/26/2015   Procedure: ESOPHAGOGASTRODUODENOSCOPY  (EGD);  Surgeon: 07/28/2015, MD;  Location: [facility];  Service: Endoscopy;  Laterality: N/A;   LEFT HEART CATH AND CORONARY ANGIOGRAPHY N/A 07/21/2021   Procedure: LEFT HEART CATH AND CORONARY ANGIOGRAPHY;  Surgeon: 07/23/2021, MD;  Location: [facility] INVASIVE CV LAB;  Service: Cardiovascular;  Laterality: N/A;    Allergies  No Known Allergies  History of Present Illness    Melissa Casey is a 56 y.o. female with a hx of HTN, HLD, DM2, obesity, CAD (07/21/21 DES to distal Cx) last seen for cardiac catheterization.   Prior monitor 03/2017 frequent PVC in setting of hypokalemia which were improved with Metoprolol. Echo 03/2017 EF 60-65% with normal diastolic function. Had COVID-19 01/2021. At follow up 04/2021 noted chest pain with following cardiac CTA with calcium score 579 placing her in 99th percentile. Noted significant stenosis to Cx by FFR. Underwent LHC 07/21/21 with PCI of distal circumflex, mid Cx 25%, 2nd diag 50%, mid LAD 25%. Her EF 55-65%. Recommended for DAPT ASA/Plavix for 6 mos then consider Plavix monotherapy.   Pleasant lady who presents for follow up. Works as a 07/23/21. Involved with her church and serving at [employer] thia week. BP at home 140s/70s. Notes some urgency when going to the restroom. Reviewed that she is not taking diuretic. Reports no shortness of breath nor dyspnea on exertion. Reports no chest pain, pressure, or tightness. No edema, orthopnea, PND. Reports no palpitations.    EKGs/Labs/Other Studies Reviewed:   The following studies were reviewed today:  LHC 07/21/21      Mid Cx lesion is  25% stenosed.   2nd Diag lesion is 50% stenosed.   Mid LAD lesion is 25% stenosed.   Mid Cx to Dist Cx lesion is 75% stenosed.  A drug-eluting stent was successfully placed using a SYNERGY XD 2.25X20, post dilated to 2.5 mm.   Post intervention, there is a 0% residual stenosis.   The left ventricular systolic function is normal.   LV end diastolic pressure is  mildly elevated.   The left ventricular ejection fraction is 55-65% by visual estimate.   There is no aortic valve stenosis.   Severe right subclavian tortuosity.  If emergent cath was needed, would not use right radial approach in the future.  May need to consider using destination sheath to help straighten the tortuosity.   Successful PCI of the distal circumflex.  Continue dual antiplatelet therapy for at least 6 months.  Consider clopidogrel monotherapy after that time.  Continue aggressive secondary prevention and risk factor management.   Plan for same-day discharge.  Results conveyed to the son, Quita Skye [id number].   Diagnostic Dominance: Right  Intervention   Cardiac CTA 06/07/21 IMPRESSION: 1. Significantly reduced signal to noise ratio limits the diagnostic quality of this scan.   2. Severe CAD in the mid-distal LAD and LCx, CADRADS = 4. CT FFR will be performed and reported separately.   3. Coronary calcium score is 579, which places the patient in the 99th percentile for age and sex matched control.   4. Normal coronary origins with right dominance.   5. Mild dilation of main pulmonary artery, 30 mm, may indicate pulmonary hypertension.   1. Left Main: FFR = 0.97   2. LAD: Proximal FFR = 0.91, mid FFR = 0.83, Distal FFR = 0.73, D1 FFR = 0.84 3. LCX: Proximal FFR = 0.93, distal FFR = 0.69, OM1 FFR = 0.93 4. RCA: Proximal FFR = 0.95, mid FFR =0.88, distal FFR = 0.80   IMPRESSION: 1. CT FFR analysis showed significant stenosis in the distal LCx lesion, 0.69. The vessel measures approximately 2 mm just proximal to this lesion.   2. Mid-distal LAD lesions does not demonstrate hemodynamically significant stenosis nor significant FFR delta. The distal most LAD has reduced FFR but that may be secondary to vessel tapering and diffuse disease. Vessel is <2 mm distally.   RECOMMENDATIONS: Guideline-directed medical therapy and aggressive risk factor modification for  secondary prevention of coronary artery disease. If refractory symptoms, consider coronary angiography or alternative imaging modality given reduced quality of coronary CTA.  EKG:  EKG is ordered today.  The ekg ordered today demonstrates NSR 70 bpm.  No acute ST/T wave changes.  Overall flat T waves in lateral leads.  Recent Labs: 07/13/2021: ALT 30; BUN 12; Creatinine, Ser 0.70; Potassium 3.5; Sodium 138 07/15/2021: Hemoglobin 12.6; Platelets 283  Recent Lipid Panel    Component Value Date/Time   CHOL 123 07/13/2021 0903   TRIG 98 07/13/2021 0903   HDL 47 07/13/2021 0903   CHOLHDL 2.6 07/13/2021 0903   LDLCALC 58 07/13/2021 0903    Home Medications   Current Meds  Medication Sig   acetaminophen (TYLENOL) 500 MG tablet Take 1,000 mg by mouth every 6 (six) hours as needed for moderate pain.   amLODipine (NORVASC) 10 MG tablet Take 10 mg by mouth daily.   aspirin EC 81 MG tablet Take 81 mg by mouth daily. Swallow whole.   atorvastatin (LIPITOR) 40 MG tablet Take 1 tablet (40 mg total) by mouth every evening.   carvedilol (COREG)  25 MG tablet Take 1 tablet (25 mg total) by mouth 2 (two) times daily.   cetirizine (ZYRTEC ALLERGY) 10 MG tablet Take 1 tablet (10 mg total) by mouth daily. (Patient taking differently: Take 10 mg by mouth daily as needed for allergies.)   clopidogrel (PLAVIX) 75 MG tablet Take 1 tablet (75 mg total) by mouth daily.   hydrALAZINE (APRESOLINE) 25 MG tablet Take 1 tablet (25 mg total) by mouth in the morning and at bedtime.   hydrochlorothiazide (HYDRODIURIL) 25 MG tablet Take 25 mg by mouth daily.   ibuprofen (ADVIL,MOTRIN) 800 MG tablet Take 800 mg by mouth every 8 (eight) hours as needed for headache, mild pain or moderate pain.   Insulin NPH, Human,, Isophane, (NOVOLIN N FLEXPEN) 100 UNIT/ML Kiwkpen Inject 3 Units into the skin daily as needed (For blood sugar over 180).   nitroGLYCERIN (NITROSTAT) 0.4 MG SL tablet Place 1 tablet (0.4 mg total) under the  tongue every 5 (five) minutes as needed.   pantoprazole (PROTONIX) 40 MG tablet Take 1 tablet (40 mg total) by mouth daily.   SitaGLIPtin-MetFORMIN HCl (JANUMET XR) [phone] MG TB24 Take 1 tablet by mouth at bedtime. Patient receives SAMPLES   [DISCONTINUED] telmisartan (MICARDIS) 80 MG tablet Take 1 tablet (80 mg total) by mouth daily.     Review of Systems      All other systems reviewed and are otherwise negative except as noted above.  Physical Exam    VS:  BP (!) 148/78   Pulse 70   Ht 5\' 9"  (1.753 m)   Wt 275 lb (124.7 kg)   LMP 07/24/2015 Comment: currently experiencing cycle at this time  BMI 40.61 kg/m  , BMI Body mass index is 40.61 kg/m.  Wt Readings from Last 3 Encounters:  07/28/21 275 lb (124.7 kg)  07/21/21 268 lb (121.6 kg)  07/15/21 273 lb 12.8 oz (124.2 kg)     GEN: Well nourished, well developed, in no acute distress. HEENT: normal. Neck: Supple, no JVD, carotid bruits, or masses. Cardiac: RRR, no murmurs, rubs, or gallops. No clubbing, cyanosis, edema.  Radials/PT 2+ and equal bilaterally.  Respiratory:  Respirations regular and unlabored, clear to auscultation bilaterally. GI: Soft, nontender, nondistended. MS: No deformity or atrophy. Skin: Warm and dry, no rash. Neuro:  Strength and sensation are intact. Psych: Normal affect.  Assessment & Plan    CAD - 06/2021 DES to distal Dx. Cath site healing well. Works as 07/2021 - may return to work without restriction 08/05/21 - letter provided. GDMT includes aspirin/plavix (DAPT for at least 6 mos), Amlodipine, Coreg, Atorvastatin. Heart healthy diet and regular cardiovascular exercise encouraged.  Plans to participate in cardiac rehab.   HTN - BP not at goal <130/80. Continue Coreg 25mg  BID, Telmisartan 80mg  QD, Amlodipine 10mg  QD. Start Hydralazine 25mg  BID. Discussed to monitor BP at home at least 2 hours after medications and sitting for 5-10 minutes.   DM2  - Continue to follow with PCP.   HLD, LDL goal  <70 - Continue Atorvastatin 40mg  QD.   Obesity - Weight loss via diet and exercise encouraged. Discussed the impact being overweight would have on cardiovascular risk. Plans to participate in cardiac rehab. Could consider PREP upcoming completion of cardiac rehab.   PVC - Quiescent on Carvedilol  GERD - Continue to follow with PCP. Continue Protonix 40mg  QD.     Cardiac Rehabilitation Eligibility Assessment  The patient is ready to start cardiac rehabilitation from a cardiac standpoint.  Disposition: Follow up in 2 month(s) with Skeet Latch, MD or [facility].  Signed, Loel Dubonnet, NP 07/30/2021, 7:47 PM [place]

## 2021-07-28 NOTE — Patient Instructions (Signed)
Medication Instructions:  Your physician has recommended you make the following change in your medication:   Start: Hydralazine 25mg  twice daily   We sent in refills of Telmisartan 80mg  tablets  *If you need a refill on your cardiac medications before your next appointment, please call your pharmacy*   Lab Work: None ordered today   Testing/Procedures: None ordered today    Follow-Up: At [facility], you and your health needs are our priority.  As part of our continuing mission to provide you with exceptional heart care, we have created designated Provider Care Teams.  These Care Teams include your primary Cardiologist (physician) and Advanced Practice Providers (APPs -  Physician Assistants and Nurse Practitioners) who all work together to provide you with the care you need, when you need it.  We recommend signing up for the patient portal called "MyChart".  Sign up information is provided on this After Visit Summary.  MyChart is used to connect with patients for Virtual Visits (Telemedicine).  Patients are able to view lab/test results, encounter notes, upcoming appointments, etc.  Non-urgent messages can be sent to your provider as well.   To learn more about what you can do with MyChart, go to .    Your next appointment:   Follow up as scheduled   Other Instructions Heart Healthy Diet Recommendations: A low-salt diet is recommended. Meats should be grilled, baked, or boiled. Avoid fried foods. Focus on lean protein sources like fish or chicken with vegetables and fruits. The American Heart Association is a [facility]!  American Heart Association Diet and Lifeystyle Recommendations   Exercise recommendations: The American Heart Association recommends 150 minutes of moderate intensity exercise weekly. Try 30 minutes of moderate intensity exercise 4-5 times per week. This could include walking, jogging, or swimming.   Important Information About  Sugar

## 2021-07-30 ENCOUNTER — Encounter (HOSPITAL_BASED_OUTPATIENT_CLINIC_OR_DEPARTMENT_OTHER): Payer: Self-pay | Admitting: Family

## 2021-08-16 ENCOUNTER — Encounter (HOSPITAL_COMMUNITY): Payer: Self-pay

## 2021-08-16 ENCOUNTER — Encounter (HOSPITAL_COMMUNITY)
Admission: RE | Admit: 2021-08-16 | Discharge: 2021-08-16 | Disposition: A | Discharge status: Home / Self Care | Payer: BC Managed Care – PPO | Source: Ambulatory Visit | Attending: Cardiology | Admitting: Cardiology

## 2021-08-16 VITALS — BP 124/80 | HR 74 | Ht 69.0 in | Wt 274.7 lb

## 2021-08-16 DIAGNOSIS — Z955 Presence of coronary angioplasty implant and graft: Secondary | ICD-10-CM | POA: Insufficient documentation

## 2021-08-16 NOTE — Progress Notes (Signed)
Cardiac Individual Treatment Plan  Patient Details  Name: Melissa Casey MRN: [id number] Date of Birth: 1965/04/29 Referring Provider:   Flowsheet Row CARDIAC REHAB PHASE II ORIENTATION from 08/16/2021 in [facility]  Referring Provider Dr. Irish Lack       Initial Encounter Date:  Flowsheet Row CARDIAC REHAB PHASE II ORIENTATION from 08/16/2021 in [facility]  Date 08/16/21       Visit Diagnosis: Status post insertion of drug eluting coronary artery stent  Patient's Home Medications on Admission:  Current Outpatient Medications:    amLODipine (NORVASC) 10 MG tablet, Take 10 mg by mouth in the morning., Disp: , Rfl:    aspirin EC 81 MG tablet, Take 81 mg by mouth in the morning. Swallow whole., Disp: , Rfl:    atorvastatin (LIPITOR) 40 MG tablet, Take 1 tablet (40 mg total) by mouth every evening., Disp: 30 tablet, Rfl: 11   carvedilol (COREG) 25 MG tablet, Take 1 tablet (25 mg total) by mouth 2 (two) times daily., Disp: 180 tablet, Rfl: 3   cetirizine (ZYRTEC ALLERGY) 10 MG tablet, Take 1 tablet (10 mg total) by mouth daily. (Patient taking differently: Take 10 mg by mouth daily as needed for allergies.), Disp: 30 tablet, Rfl: 0   clopidogrel (PLAVIX) 75 MG tablet, Take 1 tablet (75 mg total) by mouth daily., Disp: 30 tablet, Rfl: 5   hydrALAZINE (APRESOLINE) 25 MG tablet, Take 1 tablet (25 mg total) by mouth in the morning and at bedtime., Disp: 180 tablet, Rfl: 3   hydrochlorothiazide (HYDRODIURIL) 25 MG tablet, Take 25 mg by mouth in the morning., Disp: , Rfl:    ibuprofen (ADVIL,MOTRIN) 800 MG tablet, Take 800 mg by mouth every 8 (eight) hours as needed for headache, mild pain or moderate pain., Disp: , Rfl:    Insulin NPH, Human,, Isophane, (NOVOLIN N FLEXPEN) 100 UNIT/ML Kiwkpen, Inject 3 Units into the skin daily as needed (For blood sugar over 180)., Disp: , Rfl:    nitroGLYCERIN (NITROSTAT) 0.4 MG SL tablet, Place 1 tablet (0.4  mg total) under the tongue every 5 (five) minutes as needed., Disp: 25 tablet, Rfl: 2   pantoprazole (PROTONIX) 40 MG tablet, Take 1 tablet (40 mg total) by mouth daily., Disp: 30 tablet, Rfl: 3   Polyethyl Glycol-Propyl Glycol (LUBRICANT EYE DROPS) 0.4-0.3 % SOLN, Place 1-2 drops into both eyes 3 (three) times daily as needed (dry/irritated eyes.)., Disp: , Rfl:    SitaGLIPtin-MetFORMIN HCl (JANUMET XR) [phone] MG TB24, Take 1 tablet by mouth at bedtime. Patient receives SAMPLES, Disp: , Rfl:    telmisartan (MICARDIS) 80 MG tablet, Take 1 tablet (80 mg total) by mouth daily., Disp: 90 tablet, Rfl: 3 No current facility-administered medications for this encounter.  Facility-Administered Medications Ordered in Other Encounters:    diatrizoate meglumine (CYSTOGRAFIN/HYPAQUE) 30 % solution, , , PRN, Raynelle Bring, MD, 20 mL at 12/24/19 2143  Past Medical History: Past Medical History:  Diagnosis Date   CAD in native artery 07/15/2021   DVT (deep venous thrombosis) (HCC)    remote past, over 20 years ago, left leg   Essential hypertension, benign    GERD (gastroesophageal reflux disease) 07/15/2021   History of COVID-19 08/2019   History of kidney stones    IBS (irritable bowel syndrome)    Premature ventricular contractions (PVCs) (VPCs)    Per Dr. Berdine Addison   Pure hypercholesterolemia 05/23/2021   Type 2 diabetes mellitus ([place])     Tobacco Use: Social History  Tobacco Use  Smoking Status Never  Smokeless Tobacco Never    Labs: Review Flowsheet       Latest Ref Rng & Units 01/26/2020 07/13/2021  Labs for ITP Cardiac and Pulmonary Rehab  Cholestrol 100 - 199 mg/dL - 123   LDL (calc) 0 - 99 mg/dL - 58   HDL-C >39 mg/dL - 47   Trlycerides 0 - 149 mg/dL - 98   Hemoglobin A1c 4.8 - 5.6 % 6.7  -    Capillary Blood Glucose: Lab Results  Component Value Date   GLUCAP 189 (H) 07/21/2021   GLUCAP 192 (H) 07/21/2021   GLUCAP 174 (H) 01/26/2020   GLUCAP 124 (H) 12/24/2019   GLUCAP  105 (H) 12/24/2019    POCT Glucose     Row Name 08/16/21 0840             POCT Blood Glucose   Pre-Exercise 191 mg/dL                Exercise Target Goals: Exercise Program Goal: Individual exercise prescription set using results from initial 6 min walk test and THRR while considering  patient's activity barriers and safety.   Exercise Prescription Goal: Starting with aerobic activity 30 plus minutes a day, 3 days per week for initial exercise prescription. Provide home exercise prescription and guidelines that participant acknowledges understanding prior to discharge.  Activity Barriers & Risk Stratification:  Activity Barriers & Cardiac Risk Stratification - 08/16/21 0851       Activity Barriers & Cardiac Risk Stratification   Activity Barriers Arthritis;Joint Problems    Cardiac Risk Stratification High             6 Minute Walk:  6 Minute Walk     Row Name 08/16/21 1011         6 Minute Walk   Phase Initial     Distance 1500 feet     Walk Time 6 minutes     # of Rest Breaks 0     MPH 2.84     METS 3.2     RPE 12     VO2 Peak 11.22     Symptoms No     Resting HR 72 bpm     Resting BP 124/80     Resting Oxygen Saturation  97 %     Exercise Oxygen Saturation  during 6 min walk 97 %     Max Ex. HR 90 bpm     Max Ex. BP 140/80     2 Minute Post BP 122/88              Oxygen Initial Assessment:   Oxygen Re-Evaluation:   Oxygen Discharge (Final Oxygen Re-Evaluation):   Initial Exercise Prescription:  Initial Exercise Prescription - 08/16/21 1000       Date of Initial Exercise RX and Referring Provider   Date 08/16/21    Referring Provider Dr. Irish Lack    Expected Discharge Date 11/09/21      Treadmill   MPH 1.9    Grade 0    Minutes 17      Recumbant Elliptical   Level 1    RPM 45    Minutes 22      Prescription Details   Frequency (times per week) 3    Duration Progress to 30 minutes of continuous aerobic without  signs/symptoms of physical distress      Intensity   THRR 40-80% of Max Heartrate 66-132    Ratings  of Perceived Exertion 11-13      Resistance Training   Training Prescription Yes    Weight 3    Reps 10-15             Perform Capillary Blood Glucose checks as needed.  Exercise Prescription Changes:   Exercise Comments:   Exercise Goals and Review:   Exercise Goals     Row Name 08/16/21 1014             Exercise Goals   Increase Physical Activity Yes       Intervention Provide advice, education, support and counseling about physical activity/exercise needs.;Develop an individualized exercise prescription for aerobic and resistive training based on initial evaluation findings, risk stratification, comorbidities and participant's personal goals.       Expected Outcomes Short Term: Attend rehab on a regular basis to increase amount of physical activity.;Long Term: Add in home exercise to make exercise part of routine and to increase amount of physical activity.;Long Term: Exercising regularly at least 3-5 days a week.       Increase Strength and Stamina Yes       Intervention Provide advice, education, support and counseling about physical activity/exercise needs.;Develop an individualized exercise prescription for aerobic and resistive training based on initial evaluation findings, risk stratification, comorbidities and participant's personal goals.       Expected Outcomes Short Term: Increase workloads from initial exercise prescription for resistance, speed, and METs.;Short Term: Perform resistance training exercises routinely during rehab and add in resistance training at home;Long Term: Improve cardiorespiratory fitness, muscular endurance and strength as measured by increased METs and functional capacity ( )       Able to understand and use rate of perceived exertion (RPE) scale Yes       Intervention Provide education and explanation on how to use RPE scale        Expected Outcomes Short Term: Able to use RPE daily in rehab to express subjective intensity level;Long Term:  Able to use RPE to guide intensity level when exercising independently       Knowledge and understanding of Target Heart Rate Range (THRR) Yes       Intervention Provide education and explanation of THRR including how the numbers were predicted and where they are located for reference       Expected Outcomes Short Term: Able to state/look up THRR;Long Term: Able to use THRR to govern intensity when exercising independently;Short Term: Able to use daily as guideline for intensity in rehab       Able to check pulse independently Yes       Intervention Provide education and demonstration on how to check pulse in carotid and radial arteries.;Review the importance of being able to check your own pulse for safety during independent exercise       Expected Outcomes Short Term: Able to explain why pulse checking is important during independent exercise;Long Term: Able to check pulse independently and accurately       Understanding of Exercise Prescription Yes       Intervention Provide education, explanation, and written materials on patient's individual exercise prescription       Expected Outcomes Short Term: Able to explain program exercise prescription;Long Term: Able to explain home exercise prescription to exercise independently                Exercise Goals Re-Evaluation :    Discharge Exercise Prescription (Final Exercise Prescription Changes):   Nutrition:  Target Goals: Understanding of nutrition  guidelines, daily intake of sodium 1500mg , cholesterol 200mg , calories 30% from fat and 7% or less from saturated fats, daily to have 5 or more servings of fruits and vegetables.  Biometrics:  Pre Biometrics - 08/16/21 1015       Pre Biometrics   Height 5\' 9"  (1.753 m)    Weight 274 lb 11.1 oz (124.6 kg)    Waist Circumference 47 inches    Hip Circumference 50 inches    Waist  to Hip Ratio 0.94 %    BMI (Calculated) 40.55    Triceps Skinfold 55 mm    % Body Fat 51.8 %    Grip Strength 23.7 kg    Flexibility 0 in    Single Leg Stand 13.27 seconds              Nutrition Therapy Plan and Nutrition Goals:  Nutrition Therapy & Goals - 08/16/21 0854       Intervention Plan   Intervention Nutrition handout(s) given to patient.    Expected Outcomes Short Term Goal: Understand basic principles of dietary content, such as calories, fat, sodium, cholesterol and nutrients.             Nutrition Assessments:  Nutrition Assessments - 08/16/21 0854       MEDFICTS Scores   Pre Score 36            MEDIFICTS Score Key: ?70 Need to make dietary changes  40-70 Heart Healthy Diet ? 40 Therapeutic Level Cholesterol Diet   Picture Your Plate Scores: [id number] Unhealthy dietary pattern with much room for improvement. 41-50 Dietary pattern unlikely to meet recommendations for good health and room for improvement. 51-60 More healthful dietary pattern, with some room for improvement.  >60 Healthy dietary pattern, although there may be some specific behaviors that could be improved.    Nutrition Goals Re-Evaluation:   Nutrition Goals Discharge (Final Nutrition Goals Re-Evaluation):   Psychosocial: Target Goals: Acknowledge presence or absence of significant depression and/or stress, maximize coping skills, provide positive support system. Participant is able to verbalize types and ability to use techniques and skills needed for reducing stress and depression.  Initial Review & Psychosocial Screening:  Initial Psych Review & Screening - 08/16/21 0852       Initial Review   Current issues with None Identified      Family Dynamics   Good Support System? Yes    Comments Her son is her main support system.      Barriers   Psychosocial barriers to participate in program There are no identifiable barriers or psychosocial needs.      Screening  Interventions   Interventions Encouraged to exercise    Expected Outcomes Short Term goal: Identification and review with participant of any Quality of Life or Depression concerns found by scoring the questionnaire.             Quality of Life Scores:  Quality of Life - 08/16/21 1016       Quality of Life   Select Quality of Life      Quality of Life Scores   Health/Function Pre 28.88 %    Socioeconomic Pre 30 %    Psych/Spiritual Pre 30 %    Family Pre 25.1 %    GLOBAL Pre 58.77 %            Scores of 19 and below usually indicate a poorer quality of life in these areas.  A difference of  2-3 points is a  clinically meaningful difference.  A difference of 2-3 points in the total score of the Quality of Life Index has been associated with significant improvement in overall quality of life, self-image, physical symptoms, and general health in studies assessing change in quality of life.  PHQ-9: Review Flowsheet       08/16/2021  Depression screen PHQ 2/9  Decreased Interest 0  Down, Depressed, Hopeless 0  PHQ - 2 Score 0  Altered sleeping 0  Tired, decreased energy 0  Change in appetite 0  Feeling bad or failure about yourself  0  Trouble concentrating 0  Moving slowly or fidgety/restless 0  Suicidal thoughts 0  PHQ-9 Score 0  Difficult doing work/chores Not difficult at all   Interpretation of Total Score  Total Score Depression Severity:  1-4 = Minimal depression, 5-9 = Mild depression, 10-14 = Moderate depression, 15-19 = Moderately severe depression, 20-27 = Severe depression   Psychosocial Evaluation and Intervention:  Psychosocial Evaluation - 08/16/21 0932       Psychosocial Evaluation & Interventions   Interventions Encouraged to exercise with the program and follow exercise prescription    Comments Pt has no barriers to participating in CR. She has no identifiable psychosocial issues. She scored a 0 on her PHQ-9. She reports feeling much better since  receiving her coronary stent, and that she has been walking frequently since. 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 She lists her son as her support system. Her goals while int eh program are to lose weight and to be able to come off of some of her BP medications. She is eager to start the program.    Expected Outcomes Pt will continue to have no identifiable psychosocial issues.    Continue Psychosocial Services  No Follow up required             Psychosocial Re-Evaluation:   Psychosocial Discharge (Final Psychosocial Re-Evaluation):   Vocational Rehabilitation: Provide vocational rehab assistance to qualifying candidates.   Vocational Rehab Evaluation & Intervention:  Vocational Rehab - 08/16/21 0488       Initial Vocational Rehab Evaluation & Intervention   Assessment shows need for Vocational Rehabilitation No      Vocational Rehab Re-Evaulation   Comments She has already returned to work without difficulty             Education: Education Goals: Education classes will be provided on a weekly basis, covering required topics. Participant will state understanding/return demonstration of topics presented.  Learning Barriers/Preferences:  Learning Barriers/Preferences - 08/16/21 0856       Learning Barriers/Preferences   Learning Barriers None    Learning Preferences Skilled Demonstration;Individual Instruction             Education Topics: Hypertension, Hypertension Reduction -Define heart disease and high blood pressure. Discus how high blood pressure affects the body and ways to reduce high blood pressure.   Exercise and Your Heart -Discuss why it is important to exercise, the FITT principles of exercise, normal and abnormal responses to exercise, and how to exercise safely.   Angina -Discuss definition of angina, causes of angina, treatment of angina, and how to  decrease risk of having angina.   Cardiac Medications -Review what the following cardiac medications are used for, how they affect the body, and side effects that may occur when taking the medications.  Medications include Aspirin, Beta blockers, calcium channel blockers, ACE  Inhibitors, angiotensin receptor blockers, diuretics, digoxin, and antihyperlipidemics.   Congestive Heart Failure -Discuss the definition of CHF, how to live with CHF, the signs and symptoms of CHF, and how keep track of weight and sodium intake.   Heart Disease and Intimacy -Discus the effect sexual activity has on the heart, how changes occur during intimacy as we age, and safety during sexual activity.   Smoking Cessation / COPD -Discuss different methods to quit smoking, the health benefits of quitting smoking, and the definition of COPD.   Nutrition I: Fats -Discuss the types of cholesterol, what cholesterol does to the heart, and how cholesterol levels can be controlled.   Nutrition II: Labels -Discuss the different components of food labels and how to read food label   Heart Parts/Heart Disease and PAD -Discuss the anatomy of the heart, the pathway of blood circulation through the heart, and these are affected by heart disease.   Stress I: Signs and Symptoms -Discuss the causes of stress, how stress may lead to anxiety and depression, and ways to limit stress.   Stress II: Relaxation -Discuss different types of relaxation techniques to limit stress.   Warning Signs of Stroke / TIA -Discuss definition of a stroke, what the signs and symptoms are of a stroke, and how to identify when someone is having stroke.   Knowledge Questionnaire Score:  Knowledge Questionnaire Score - 08/16/21 0829       Knowledge Questionnaire Score   Pre Score 17/28             Core Components/Risk Factors/Patient Goals at Admission:  Personal Goals and Risk Factors at Admission - 08/16/21 0902       Core  Components/Risk Factors/Patient Goals on Admission    Weight Management Yes;Obesity;Weight Loss    Intervention Obesity: Provide education and appropriate resources to help participant work on and attain dietary goals.;Weight Management/Obesity: Establish reasonable short term and long term weight goals.;Weight Management: Provide education and appropriate resources to help participant work on and attain dietary goals.;Weight Management: Develop a combined nutrition and exercise program designed to reach desired caloric intake, while maintaining appropriate intake of nutrient and fiber, sodium and fats, and appropriate energy expenditure required for the weight goal.    Expected Outcomes Short Term: Continue to assess and modify interventions until short term weight is achieved;Long Term: Adherence to nutrition and physical activity/exercise program aimed toward attainment of established weight goal;Weight Maintenance: Understanding of the daily nutrition guidelines, which includes 25-35% calories from fat, 7% or less cal from saturated fats, less than 200mg  cholesterol, less than 1.5gm of sodium, & 5 or more servings of fruits and vegetables daily;Weight Loss: Understanding of general recommendations for a balanced deficit meal plan, which promotes 1-2 lb weight loss per week and includes a negative energy balance of [phone] kcal/d;Understanding recommendations for meals to include 15-35% energy as protein, 25-35% energy from fat, 35-60% energy from carbohydrates, less than 200mg  of dietary cholesterol, 20-35 gm of total fiber daily;Understanding of distribution of calorie intake throughout the day with the consumption of 4-5 meals/snacks    Diabetes Yes    Intervention Provide education about signs/symptoms and action to take for hypo/hyperglycemia.;Provide education about proper nutrition, including hydration, and aerobic/resistive exercise prescription along with prescribed medications to achieve blood  glucose in normal ranges: Fasting glucose 65-99 mg/dL    Expected Outcomes Short Term: Participant verbalizes understanding of the signs/symptoms and immediate care of hyper/hypoglycemia, proper foot care and importance of medication, aerobic/resistive exercise and nutrition plan  for blood glucose control.;Long Term: Attainment of HbA1C < 7%.    Hypertension Yes    Intervention Provide education on lifestyle modifcations including regular physical activity/exercise, weight management, moderate sodium restriction and increased consumption of fresh fruit, vegetables, and low fat dairy, alcohol moderation, and smoking cessation.;Monitor prescription use compliance.    Expected Outcomes Short Term: Continued assessment and intervention until BP is < 140/68mm HG in hypertensive participants. < 130/3mm HG in hypertensive participants with diabetes, heart failure or chronic kidney disease.;Long Term: Maintenance of blood pressure at goal levels.    Lipids Yes    Intervention Provide education and support for participant on nutrition & aerobic/resistive exercise along with prescribed medications to achieve LDL 70mg , HDL >40mg .    Expected Outcomes Short Term: Participant states understanding of desired cholesterol values and is compliant with medications prescribed. Participant is following exercise prescription and nutrition guidelines.;Long Term: Cholesterol controlled with medications as prescribed, with individualized exercise RX and with personalized nutrition plan. Value goals: LDL < 70mg , HDL > 40 mg.             Core Components/Risk Factors/Patient Goals Review:    Core Components/Risk Factors/Patient Goals at Discharge (Final Review):    ITP Comments:   Comments: Patient arrived for 1st visit/orientation/education at 0800. Patient was referred to CR by Dr. Irish Lack due to status post insertion of drug eluting coronary artery stent (Z95.5). During orientation advised patient on arrival and  appointment times what to wear, what to do before, during and after exercise. Reviewed attendance and class policy.  Pt is scheduled to return Cardiac Rehab on 08/19/2021 at 0815. Pt was advised to come to class 15 minutes before class starts.  Discussed RPE/Dpysnea scales. Patient participated in warm up stretches. Patient was able to complete 6 minute walk test.  Telemetry:NSR. Patient was measured for the equipment. Discussed equipment safety with patient. Took patient pre-anthropometric measurements. Patient finished visit at [phone].

## 2021-08-19 ENCOUNTER — Encounter (HOSPITAL_COMMUNITY)
Admission: RE | Admit: 2021-08-19 | Discharge: 2021-08-19 | Disposition: A | Discharge status: Home / Self Care | Payer: BC Managed Care – PPO | Source: Ambulatory Visit | Attending: Cardiology | Admitting: Cardiology

## 2021-08-19 DIAGNOSIS — Z955 Presence of coronary angioplasty implant and graft: Secondary | ICD-10-CM

## 2021-08-19 NOTE — Progress Notes (Signed)
Daily Session Note  Patient Details  Name: Melissa Casey MRN: [id number] Date of Birth: 06-02-1965 Referring Provider:   Flowsheet Row CARDIAC REHAB PHASE II ORIENTATION from 08/16/2021 in [facility]  Referring Provider Dr. Irish Lack       Encounter Date: 08/19/2021  Check In:  Session Check In - 08/19/21 0814       Check-In   Supervising physician immediately available to respond to emergencies [facility] MD immediately available    Physician(s) Dr. Harl Bowie    Location [facility]    Staff Present Redge Gainer, BS, Exercise Physiologist;Dalton Kris Mouton, MS, ACSM-CEP, Exercise Physiologist;Delorse Shane Hassell Done, RN, BSN    Virtual Visit No    Medication changes reported     No    Fall or balance concerns reported    No    Tobacco Cessation No Change    Warm-up and Cool-down Performed as group-led instruction    Resistance Training Performed Yes    VAD Patient? No    PAD/SET Patient? No      Pain Assessment   Currently in Pain? No/denies    Multiple Pain Sites No             Capillary Blood Glucose: No results found for this or any previous visit (from the past 24 hour(s)).    Social History   Tobacco Use  Smoking Status Never  Smokeless Tobacco Never    Goals Met:  Independence with exercise equipment Exercise tolerated well No report of concerns or symptoms today Strength training completed today  Goals Unmet:  Not Applicable  Comments: checkout at 0915.   Dr. Carlyle Dolly is Medical Director for [facility]

## 2021-08-22 ENCOUNTER — Encounter (HOSPITAL_COMMUNITY): Payer: BC Managed Care – PPO

## 2021-08-24 ENCOUNTER — Encounter (HOSPITAL_COMMUNITY): Payer: BC Managed Care – PPO

## 2021-08-26 ENCOUNTER — Encounter (HOSPITAL_COMMUNITY): Payer: BC Managed Care – PPO

## 2021-08-29 ENCOUNTER — Encounter (HOSPITAL_COMMUNITY): Payer: BC Managed Care – PPO

## 2021-08-31 ENCOUNTER — Encounter (HOSPITAL_COMMUNITY): Payer: BC Managed Care – PPO

## 2021-09-02 ENCOUNTER — Encounter (HOSPITAL_COMMUNITY): Payer: BC Managed Care – PPO

## 2021-09-05 ENCOUNTER — Encounter (HOSPITAL_COMMUNITY): Payer: BC Managed Care – PPO

## 2021-09-07 ENCOUNTER — Encounter (HOSPITAL_COMMUNITY): Payer: BC Managed Care – PPO

## 2021-09-08 ENCOUNTER — Other Ambulatory Visit (HOSPITAL_COMMUNITY): Payer: Self-pay

## 2021-09-08 NOTE — Progress Notes (Signed)
Discharge Progress Report  Patient Details  Name: Melissa Casey MRN: [id number] Date of Birth: 1965-04-21 Referring Provider:   Flowsheet Row CARDIAC REHAB PHASE II ORIENTATION from 08/16/2021 in [facility] CARDIAC REHABILITATION  Referring Provider Dr. Eldridge Dace        Number of Visits: 2  Reason for Discharge:  Early Exit:  Lack of attendance  Smoking History:  Social History   Tobacco Use  Smoking Status Never  Smokeless Tobacco Never    Diagnosis:  Status post insertion of drug eluting coronary artery stent  ADL UCSD:   Initial Exercise Prescription:  Initial Exercise Prescription - 08/16/21 1000       Date of Initial Exercise RX and Referring Provider   Date 08/16/21    Referring Provider Dr. Eldridge Dace    Expected Discharge Date 11/09/21      Treadmill   MPH 1.9    Grade 0    Minutes 17      Recumbant Elliptical   Level 1    RPM 45    Minutes 22      Prescription Details   Frequency (times per week) 3    Duration Progress to 30 minutes of continuous aerobic without signs/symptoms of physical distress      Intensity   THRR 40-80% of Max Heartrate 66-132    Ratings of Perceived Exertion 11-13      Resistance Training   Training Prescription Yes    Weight 3    Reps 10-15             Discharge Exercise Prescription (Final Exercise Prescription Changes):  Exercise Prescription Changes - 08/19/21 0900       Response to Exercise   Blood Pressure (Admit) 142/70    Blood Pressure (Exercise) 150/78    Blood Pressure (Exit) 128/78    Heart Rate (Admit) 87 bpm    Heart Rate (Exercise) 111 bpm    Heart Rate (Exit) 96 bpm    Rating of Perceived Exertion (Exercise) 12    Duration Continue with 30 min of aerobic exercise without signs/symptoms of physical distress.    Intensity THRR unchanged      Progression   Progression Continue to progress workloads to maintain intensity without signs/symptoms of physical distress.      Resistance  Training   Training Prescription Yes    Weight 3    Reps 10-15    Time 10 Minutes      Treadmill   MPH 1.9    Grade 0    Minutes 22    METs 2.45      Recumbant Elliptical   Level 1    RPM 64    Minutes 17    METs 3.1             Functional Capacity:  6 Minute Walk     Row Name 08/16/21 1011         6 Minute Walk   Phase Initial     Distance 1500 feet     Walk Time 6 minutes     # of Rest Breaks 0     MPH 2.84     METS 3.2     RPE 12     VO2 Peak 11.22     Symptoms No     Resting HR 72 bpm     Resting BP 124/80     Resting Oxygen Saturation  97 %     Exercise Oxygen Saturation  during 6 min walk  97 %     Max Ex. HR 90 bpm     Max Ex. BP 140/80     2 Minute Post BP 122/88              Psychological, QOL, Others - Outcomes: PHQ 2/9:    08/16/2021    8:29 AM  Depression screen PHQ 2/9  Decreased Interest 0  Down, Depressed, Hopeless 0  PHQ - 2 Score 0  Altered sleeping 0  Tired, decreased energy 0  Change in appetite 0  Feeling bad or failure about yourself  0  Trouble concentrating 0  Moving slowly or fidgety/restless 0  Suicidal thoughts 0  PHQ-9 Score 0  Difficult doing work/chores Not difficult at all    Quality of Life:  Quality of Life - 08/16/21 1016       Quality of Life   Select Quality of Life      Quality of Life Scores   Health/Function Pre 28.88 %    Socioeconomic Pre 30 %    Psych/Spiritual Pre 30 %    Family Pre 25.1 %    GLOBAL Pre 58.77 %             Personal Goals: Goals established at orientation with interventions provided to work toward goal.  Personal Goals and Risk Factors at Admission - 08/16/21 0902       Core Components/Risk Factors/Patient Goals on Admission    Weight Management Yes;Obesity;Weight Loss    Intervention Obesity: Provide education and appropriate resources to help participant work on and attain dietary goals.;Weight Management/Obesity: Establish reasonable short term and long term  weight goals.;Weight Management: Provide education and appropriate resources to help participant work on and attain dietary goals.;Weight Management: Develop a combined nutrition and exercise program designed to reach desired caloric intake, while maintaining appropriate intake of nutrient and fiber, sodium and fats, and appropriate energy expenditure required for the weight goal.    Expected Outcomes Short Term: Continue to assess and modify interventions until short term weight is achieved;Long Term: Adherence to nutrition and physical activity/exercise program aimed toward attainment of established weight goal;Weight Maintenance: Understanding of the daily nutrition guidelines, which includes 25-35% calories from fat, 7% or less cal from saturated fats, less than 200mg  cholesterol, less than 1.5gm of sodium, & 5 or more servings of fruits and vegetables daily;Weight Loss: Understanding of general recommendations for a balanced deficit meal plan, which promotes 1-2 lb weight loss per week and includes a negative energy balance of [phone] kcal/d;Understanding recommendations for meals to include 15-35% energy as protein, 25-35% energy from fat, 35-60% energy from carbohydrates, less than 200mg  of dietary cholesterol, 20-35 gm of total fiber daily;Understanding of distribution of calorie intake throughout the day with the consumption of 4-5 meals/snacks    Diabetes Yes    Intervention Provide education about signs/symptoms and action to take for hypo/hyperglycemia.;Provide education about proper nutrition, including hydration, and aerobic/resistive exercise prescription along with prescribed medications to achieve blood glucose in normal ranges: Fasting glucose 65-99 mg/dL    Expected Outcomes Short Term: Participant verbalizes understanding of the signs/symptoms and immediate care of hyper/hypoglycemia, proper foot care and importance of medication, aerobic/resistive exercise and nutrition plan for blood  glucose control.;Long Term: Attainment of HbA1C < 7%.    Hypertension Yes    Intervention Provide education on lifestyle modifcations including regular physical activity/exercise, weight management, moderate sodium restriction and increased consumption of fresh fruit, vegetables, and low fat dairy, alcohol moderation, and smoking cessation.;Monitor prescription  use compliance.    Expected Outcomes Short Term: Continued assessment and intervention until BP is < 140/49mm HG in hypertensive participants. < 130/67mm HG in hypertensive participants with diabetes, heart failure or chronic kidney disease.;Long Term: Maintenance of blood pressure at goal levels.    Lipids Yes    Intervention Provide education and support for participant on nutrition & aerobic/resistive exercise along with prescribed medications to achieve LDL 70mg , HDL >40mg .    Expected Outcomes Short Term: Participant states understanding of desired cholesterol values and is compliant with medications prescribed. Participant is following exercise prescription and nutrition guidelines.;Long Term: Cholesterol controlled with medications as prescribed, with individualized exercise RX and with personalized nutrition plan. Value goals: LDL < 70mg , HDL > 40 mg.              Personal Goals Discharge:   Exercise Goals and Review:  Exercise Goals     Row Name 08/16/21 1014             Exercise Goals   Increase Physical Activity Yes       Intervention Provide advice, education, support and counseling about physical activity/exercise needs.;Develop an individualized exercise prescription for aerobic and resistive training based on initial evaluation findings, risk stratification, comorbidities and participant's personal goals.       Expected Outcomes Short Term: Attend rehab on a regular basis to increase amount of physical activity.;Long Term: Add in home exercise to make exercise part of routine and to increase amount of physical  activity.;Long Term: Exercising regularly at least 3-5 days a week.       Increase Strength and Stamina Yes       Intervention Provide advice, education, support and counseling about physical activity/exercise needs.;Develop an individualized exercise prescription for aerobic and resistive training based on initial evaluation findings, risk stratification, comorbidities and participant's personal goals.       Expected Outcomes Short Term: Increase workloads from initial exercise prescription for resistance, speed, and METs.;Short Term: Perform resistance training exercises routinely during rehab and add in resistance training at home;Long Term: Improve cardiorespiratory fitness, muscular endurance and strength as measured by increased METs and functional capacity (6MWT)       Able to understand and use rate of perceived exertion (RPE) scale Yes       Intervention Provide education and explanation on how to use RPE scale       Expected Outcomes Short Term: Able to use RPE daily in rehab to express subjective intensity level;Long Term:  Able to use RPE to guide intensity level when exercising independently       Knowledge and understanding of Target Heart Rate Range (THRR) Yes       Intervention Provide education and explanation of THRR including how the numbers were predicted and where they are located for reference       Expected Outcomes Short Term: Able to state/look up THRR;Long Term: Able to use THRR to govern intensity when exercising independently;Short Term: Able to use daily as guideline for intensity in rehab       Able to check pulse independently Yes       Intervention Provide education and demonstration on how to check pulse in carotid and radial arteries.;Review the importance of being able to check your own pulse for safety during independent exercise       Expected Outcomes Short Term: Able to explain why pulse checking is important during independent exercise;Long Term: Able to check  pulse independently and accurately  Understanding of Exercise Prescription Yes       Intervention Provide education, explanation, and written materials on patient's individual exercise prescription       Expected Outcomes Short Term: Able to explain program exercise prescription;Long Term: Able to explain home exercise prescription to exercise independently                Exercise Goals Re-Evaluation:   Nutrition & Weight - Outcomes:  Pre Biometrics - 08/16/21 1015       Pre Biometrics   Height 5\' 9"  (1.753 m)    Weight 274 lb 11.1 oz (124.6 kg)    Waist Circumference 47 inches    Hip Circumference 50 inches    Waist to Hip Ratio 0.94 %    BMI (Calculated) 40.55    Triceps Skinfold 55 mm    % Body Fat 51.8 %    Grip Strength 23.7 kg    Flexibility 0 in    Single Leg Stand 13.27 seconds              Nutrition:  Nutrition Therapy & Goals - 08/16/21 0854       Intervention Plan   Intervention Nutrition handout(s) given to patient.    Expected Outcomes Short Term Goal: Understand basic principles of dietary content, such as calories, fat, sodium, cholesterol and nutrients.             Nutrition Discharge:  Nutrition Assessments - 08/16/21 0854       MEDFICTS Scores   Pre Score 36             Education Questionnaire Score:  Knowledge Questionnaire Score - 08/16/21 0829       Knowledge Questionnaire Score   Pre Score 17/28             Pt discharged from [facility] on 09/08/2021 due to lack of attendance. We left a VM on her phone on 09/01/2021 and sent a letter the following day. Despite this, pt never contacted 11/01/2021 back, so we will discharge her.

## 2021-09-08 NOTE — Addendum Note (Signed)
Encounter addended by: Alisia Ferrari on: 09/08/2021 8:22 AM  Actions taken: Episode resolved, Flowsheet accepted, Clinical Note Signed

## 2021-09-09 ENCOUNTER — Encounter (HOSPITAL_COMMUNITY): Payer: BC Managed Care – PPO

## 2021-09-12 ENCOUNTER — Encounter (HOSPITAL_COMMUNITY): Payer: BC Managed Care – PPO

## 2021-09-14 ENCOUNTER — Encounter (HOSPITAL_COMMUNITY): Payer: BC Managed Care – PPO

## 2021-09-16 ENCOUNTER — Encounter (HOSPITAL_COMMUNITY): Payer: BC Managed Care – PPO

## 2021-09-19 ENCOUNTER — Encounter (HOSPITAL_COMMUNITY): Payer: BC Managed Care – PPO

## 2021-09-21 ENCOUNTER — Ambulatory Visit (HOSPITAL_BASED_OUTPATIENT_CLINIC_OR_DEPARTMENT_OTHER): Payer: Self-pay | Admitting: Cardiovascular Disease

## 2021-09-21 ENCOUNTER — Encounter (HOSPITAL_COMMUNITY): Payer: BC Managed Care – PPO

## 2021-09-23 ENCOUNTER — Encounter (HOSPITAL_BASED_OUTPATIENT_CLINIC_OR_DEPARTMENT_OTHER): Payer: Self-pay | Admitting: Family

## 2021-09-23 ENCOUNTER — Encounter (HOSPITAL_COMMUNITY): Payer: BC Managed Care – PPO

## 2021-09-23 ENCOUNTER — Ambulatory Visit (INDEPENDENT_AMBULATORY_CARE_PROVIDER_SITE_OTHER): Payer: BC Managed Care – PPO | Admitting: Family

## 2021-09-23 VITALS — BP 128/82 | HR 69 | Ht 69.0 in | Wt 274.7 lb

## 2021-09-23 DIAGNOSIS — E785 Hyperlipidemia, unspecified: Secondary | ICD-10-CM

## 2021-09-23 DIAGNOSIS — I10 Essential (primary) hypertension: Secondary | ICD-10-CM

## 2021-09-23 DIAGNOSIS — I493 Ventricular premature depolarization: Secondary | ICD-10-CM

## 2021-09-23 DIAGNOSIS — I25118 Atherosclerotic heart disease of native coronary artery with other forms of angina pectoris: Secondary | ICD-10-CM

## 2021-09-23 NOTE — Progress Notes (Signed)
Office Visit    Patient Name: Melissa Casey Date of Encounter: 09/23/2021  PCP:  Iona Beard, MD   [place]  Cardiologist:  Skeet Latch, MD  Advanced Practice Provider:  No care team member to display Electrophysiologist:  None      Chief Complaint    Melissa Casey is a 56 y.o. female presents today for follow-up of CAD  Past Medical History    Past Medical History:  Diagnosis Date   CAD in native artery 07/15/2021   DVT (deep venous thrombosis) ([place]    remote past, over 20 years ago, left leg   Essential hypertension, benign    GERD (gastroesophageal reflux disease) 07/15/2021   History of COVID-19 08/2019   History of kidney stones    IBS (irritable bowel syndrome)    Premature ventricular contractions (PVCs) (VPCs)    Per Dr. Berdine Addison   Pure hypercholesterolemia 05/23/2021   Type 2 diabetes mellitus ([place])    Past Surgical History:  Procedure Laterality Date   COLONOSCOPY N/A 07/26/2015   Procedure: COLONOSCOPY;  Surgeon: Danie Binder, MD;  Location[facility];  Service: Endoscopy;  Laterality: N/A;  130 - moved to 6/26 @1 :30 - office to notify pt   CORONARY STENT INTERVENTION N/A 07/21/2021   Procedure: CORONARY STENT INTERVENTION;  Surgeon: Jettie Booze, MD;  Location: [facility];  Service: Cardiovascular;  Laterality: N/A;   CYSTOSCOPY WITH RETROGRADE PYELOGRAM, URETEROSCOPY AND STENT PLACEMENT Right 12/24/2019   Procedure: CYSTOSCOPY WITH RETROGRADE PYELOGRAM, URETEROSCOPY AND STENT PLACEMENT;  Surgeon: Raynelle Bring, MD;  Location: [facility];  Service: Urology;  Laterality: Right;   CYSTOSCOPY/URETEROSCOPY/HOLMIUM LASER/STENT PLACEMENT Right 01/27/2020   Procedure: CYSTOSCOPY/URETEROSCOPY/HOLMIUM LASER/STENT PLACEMENT;  Surgeon: Robley Fries, MD;  Location: [facility];  Service: Urology;  Laterality: Right;   ESOPHAGOGASTRODUODENOSCOPY N/A 07/26/2015   Procedure: ESOPHAGOGASTRODUODENOSCOPY (EGD);  Surgeon:  Danie Binder, MD;  Location: [facility];  Service: Endoscopy;  Laterality: N/A;   LEFT HEART CATH AND CORONARY ANGIOGRAPHY N/A 07/21/2021   Procedure: LEFT HEART CATH AND CORONARY ANGIOGRAPHY;  Surgeon: Jettie Booze, MD;  Location: [facility];  Service: Cardiovascular;  Laterality: N/A;    Allergies  No Known Allergies  History of Present Illness    Melissa Casey is a 56 y.o. female with a hx of HTN, HLD, DM2, obesity, CAD (07/21/21 DES to distal Cx) last seen 07/28/21  Prior monitor 03/2017 frequent PVC in setting of hypokalemia which were improved with Metoprolol. Echo 03/2017 EF 60-65% with normal diastolic function. Had COVID-19 01/2021. At follow up 04/2021 noted chest pain with following cardiac CTA with calcium score 579 placing her in 99th percentile. Noted significant stenosis to Cx by FFR. Underwent LHC 07/21/21 with PCI of distal circumflex, mid Cx 25%, 2nd diag 50%, mid LAD 25%. Her EF 55-65%. Recommended for DAPT ASA/Plavix for 6 mos then consider Plavix monotherapy. At follow up 07/28/21 she was doing well from cardiac perspective and encouraged to participate in cardiac rehab. Hydralazine added due to elevated BP.  Pleasant lady who presents for follow up. 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 Not monitoring BP at home. Reports no shortness of breath nor dyspnea on exertion. Reports no chest pain, pressure, or tightness. No edema, orthopnea, PND. Reports occasional palpitations at night.   EKGs/Labs/Other Studies Reviewed:   The following studies were reviewed today:  LHC 07/21/21      Mid Cx lesion  is 25% stenosed.   2nd Diag lesion is 50% stenosed.   Mid LAD lesion is 25% stenosed.   Mid Cx to Dist Cx lesion is 75% stenosed.  A drug-eluting stent was successfully placed using a SYNERGY XD 2.25X20, post dilated to 2.5 mm.   Post intervention, there is a 0% residual stenosis.   The left ventricular systolic function is normal.   LV end  diastolic pressure is mildly elevated.   The left ventricular ejection fraction is 55-65% by visual estimate.   There is no aortic valve stenosis.   Severe right subclavian tortuosity.  If emergent cath was needed, would not use right radial approach in the future.  May need to consider using destination sheath to help straighten the tortuosity.   Successful PCI of the distal circumflex.  Continue dual antiplatelet therapy for at least 6 months.  Consider clopidogrel monotherapy after that time.  Continue aggressive secondary prevention and risk factor management.   Plan for same-day discharge.  Results conveyed to the son, Madelaine Bhat [phone].   Diagnostic Dominance: Right  Intervention   Cardiac CTA 06/07/21 IMPRESSION: 1. Significantly reduced signal to noise ratio limits the diagnostic quality of this scan.   2. Severe CAD in the mid-distal LAD and LCx, CADRADS = 4. CT FFR will be performed and reported separately.   3. Coronary calcium score is 579, which places the patient in the 99th percentile for age and sex matched control.   4. Normal coronary origins with right dominance.   5. Mild dilation of main pulmonary artery, 30 mm, may indicate pulmonary hypertension.   1. Left Main: FFR = 0.97   2. LAD: Proximal FFR = 0.91, mid FFR = 0.83, Distal FFR = 0.73, D1 FFR = 0.84 3. LCX: Proximal FFR = 0.93, distal FFR = 0.69, OM1 FFR = 0.93 4. RCA: Proximal FFR = 0.95, mid FFR =0.88, distal FFR = 0.80   IMPRESSION: 1. CT FFR analysis showed significant stenosis in the distal LCx lesion, 0.69. The vessel measures approximately 2 mm just proximal to this lesion.   2. Mid-distal LAD lesions does not demonstrate hemodynamically significant stenosis nor significant FFR delta. The distal most LAD has reduced FFR but that may be secondary to vessel tapering and diffuse disease. Vessel is <2 mm distally.   RECOMMENDATIONS: Guideline-directed medical therapy and aggressive risk  factor modification for secondary prevention of coronary artery disease. If refractory symptoms, consider coronary angiography or alternative imaging modality given reduced quality of coronary CTA.  EKG:  No EKG is ordered today  Recent Labs: 07/13/2021: ALT 30; BUN 12; Creatinine, Ser 0.70; Potassium 3.5; Sodium 138 07/15/2021: Hemoglobin 12.6; Platelets 283  Recent Lipid Panel    Component Value Date/Time   CHOL 123 07/13/2021 0903   TRIG 98 07/13/2021 0903   HDL 47 07/13/2021 0903   CHOLHDL 2.6 07/13/2021 0903   LDLCALC 58 07/13/2021 0903    Home Medications   Current Meds  Medication Sig   amLODipine (NORVASC) 10 MG tablet Take 10 mg by mouth in the morning.   aspirin EC 81 MG tablet Take 81 mg by mouth in the morning. Swallow whole.   atorvastatin (LIPITOR) 40 MG tablet Take 1 tablet (40 mg total) by mouth every evening.   carvedilol (COREG) 25 MG tablet Take 1 tablet (25 mg total) by mouth 2 (two) times daily.   cetirizine (ZYRTEC ALLERGY) 10 MG tablet Take 1 tablet (10 mg total) by mouth daily. (Patient taking differently: Take 10 mg  by mouth daily as needed for allergies.)   clopidogrel (PLAVIX) 75 MG tablet Take 1 tablet (75 mg total) by mouth daily.   hydrALAZINE (APRESOLINE) 25 MG tablet Take 1 tablet (25 mg total) by mouth in the morning and at bedtime.   hydrochlorothiazide (HYDRODIURIL) 25 MG tablet Take 25 mg by mouth in the morning.   ibuprofen (ADVIL,MOTRIN) 800 MG tablet Take 800 mg by mouth every 8 (eight) hours as needed for headache, mild pain or moderate pain.   Insulin NPH, Human,, Isophane, (NOVOLIN N FLEXPEN) 100 UNIT/ML Kiwkpen Inject 3 Units into the skin daily as needed (For blood sugar over 180).   pantoprazole (PROTONIX) 40 MG tablet Take 1 tablet (40 mg total) by mouth daily.   Polyethyl Glycol-Propyl Glycol (LUBRICANT EYE DROPS) 0.4-0.3 % SOLN Place 1-2 drops into both eyes 3 (three) times daily as needed (dry/irritated eyes.).   SitaGLIPtin-MetFORMIN  HCl (JANUMET XR) [phone] MG TB24 Take 1 tablet by mouth at bedtime. Patient receives SAMPLES   telmisartan (MICARDIS) 80 MG tablet Take 1 tablet (80 mg total) by mouth daily.     Review of Systems      All other systems reviewed and are otherwise negative except as noted above.  Physical Exam    VS:  BP 128/82 (BP Location: Right Arm, Patient Position: Sitting, Cuff Size: Normal)   Pulse 69   Ht 5\' 9"  (1.753 m)   Wt 274 lb 11.2 oz (124.6 kg)   LMP 07/24/2015 Comment: currently experiencing cycle at this time  SpO2 98%   BMI 40.57 kg/m  , BMI Body mass index is 40.57 kg/m.  Wt Readings from Last 3 Encounters:  09/23/21 274 lb 11.2 oz (124.6 kg)  08/16/21 274 lb 11.1 oz (124.6 kg)  07/28/21 275 lb (124.7 kg)     GEN: Well nourished, well developed, in no acute distress. HEENT: normal. Neck: Supple, no JVD, carotid bruits, or masses. Cardiac: RRR, no murmurs, rubs, or gallops. No clubbing, cyanosis, edema.  Radials/PT 2+ and equal bilaterally.  Respiratory:  Respirations regular and unlabored, clear to auscultation bilaterally. GI: Soft, nontender, nondistended. MS: No deformity or atrophy. Skin: Warm and dry, no rash. Neuro:  Strength and sensation are intact. Psych: Normal affect.  Assessment & Plan    CAD - 06/2021 DES to distal Dx. Stable with no anginal symptoms. No indication for ischemic evaluation.   GDMT includes aspirin/plavix (DAPT for at least 6 mos), Amlodipine, Coreg, Atorvastatin. Heart healthy diet and regular cardiovascular exercise encouraged.  Update CBC due to bruising with DAPT.   HTN - BP not at goal <130/80. Continue Coreg 25mg  BID, Telmisartan 80mg  QD, Amlodipine 10mg  QD, Hydralazine 25mg  BID. Discussed to monitor BP at home at least 2 hours after medications and sitting for 5-10 minutes.   DM2  - Continue to follow with PCP.   HLD, LDL goal <70 - Continue Atorvastatin 40mg  QD.   Obesity - Weight loss via diet and exercise encouraged. Discussed the  impact being overweight would have on cardiovascular risk.   PVC - Mild, not bothersome.  Continue Carvedilol. Recommend increasing fluid intake, avoiding caffeine/etoh, managing stress well.   GERD - Continue to follow with PCP. Continue Protonix 40mg  QD.      Disposition: Follow up in 6 month(s) with 07/2021, MD or APP.  Signed, , NP 09/23/2021, 5:06 PM [facility]

## 2021-09-23 NOTE — Patient Instructions (Signed)
Medication Instructions:  COnitnue your current medications.   *If you need a refill on your cardiac medications before your next appointment, please call your pharmacy*   Lab Work: Your physician recommends that you return for lab work next week in [place] for CBC  If you have labs (blood work) drawn today and your tests are completely normal, you will receive your results only by: [employer] (if you have MyChart) OR A paper copy in the mail If you have any lab test that is abnormal or we need to change your treatment, we will call you to review the results.   Testing/Procedures: None ordered today.    Follow-Up: At [facility], you and your health needs are our priority.  As part of our continuing mission to provide you with exceptional heart care, we have created designated Provider Care Teams.  These Care Teams include your primary Cardiologist (physician) and Advanced Practice Providers (APPs -  Physician Assistants and Nurse Practitioners) who all work together to provide you with the care you need, when you need it.  We recommend signing up for the patient portal called "MyChart".  Sign up information is provided on this After Visit Summary.  MyChart is used to connect with patients for Virtual Visits (Telemedicine).  Patients are able to view lab/test results, encounter notes, upcoming appointments, etc.  Non-urgent messages can be sent to your provider as well.   To learn more about what you can do with MyChart, go to [email or web address].    Your next appointment:   4-6 month(s)  The format for your next appointment:   In Person  Provider:   Chilton Si, MD or Gillian Shields, NP    Other Instructions Heart Healthy Diet Recommendations: A low-salt diet is recommended. Meats should be grilled, baked, or boiled. Avoid fried foods. Focus on lean protein sources like fish or chicken with vegetables and fruits. The American Heart Association is a Personal assistant!  American Heart Association Diet and Lifeystyle Recommendations   Exercise recommendations: The American Heart Association recommends 150 minutes of moderate intensity exercise weekly. Try 30 minutes of moderate intensity exercise 4-5 times per week. This could include walking, jogging, or swimming.   Important Information About Sugar

## 2021-09-26 ENCOUNTER — Ambulatory Visit (HOSPITAL_BASED_OUTPATIENT_CLINIC_OR_DEPARTMENT_OTHER): Payer: BC Managed Care – PPO | Admitting: Family

## 2021-09-26 ENCOUNTER — Encounter (HOSPITAL_COMMUNITY): Payer: BC Managed Care – PPO

## 2021-09-28 ENCOUNTER — Encounter (HOSPITAL_COMMUNITY): Payer: BC Managed Care – PPO

## 2021-09-30 ENCOUNTER — Encounter (HOSPITAL_COMMUNITY): Payer: BC Managed Care – PPO

## 2021-10-03 ENCOUNTER — Encounter (HOSPITAL_COMMUNITY): Payer: BC Managed Care – PPO

## 2021-10-05 ENCOUNTER — Encounter (HOSPITAL_COMMUNITY): Payer: BC Managed Care – PPO

## 2021-10-07 ENCOUNTER — Encounter (HOSPITAL_COMMUNITY): Payer: BC Managed Care – PPO

## 2021-10-10 ENCOUNTER — Encounter (HOSPITAL_COMMUNITY): Payer: BC Managed Care – PPO

## 2021-10-12 ENCOUNTER — Encounter (HOSPITAL_COMMUNITY): Payer: BC Managed Care – PPO

## 2021-10-14 ENCOUNTER — Encounter (HOSPITAL_COMMUNITY): Payer: BC Managed Care – PPO

## 2021-10-17 ENCOUNTER — Encounter (HOSPITAL_COMMUNITY): Payer: BC Managed Care – PPO

## 2021-10-19 ENCOUNTER — Encounter (HOSPITAL_COMMUNITY): Payer: BC Managed Care – PPO

## 2021-10-21 ENCOUNTER — Encounter (HOSPITAL_COMMUNITY): Payer: BC Managed Care – PPO

## 2021-10-24 ENCOUNTER — Encounter (HOSPITAL_COMMUNITY): Payer: BC Managed Care – PPO

## 2021-10-26 ENCOUNTER — Encounter (HOSPITAL_COMMUNITY): Payer: BC Managed Care – PPO

## 2021-10-28 ENCOUNTER — Encounter (HOSPITAL_COMMUNITY): Payer: BC Managed Care – PPO

## 2021-10-31 ENCOUNTER — Encounter (HOSPITAL_COMMUNITY): Payer: BC Managed Care – PPO

## 2021-11-01 ENCOUNTER — Other Ambulatory Visit (HOSPITAL_COMMUNITY)
Admission: RE | Admit: 2021-11-01 | Discharge: 2021-11-01 | Disposition: A | Discharge status: Home / Self Care | Payer: BC Managed Care – PPO | Source: Ambulatory Visit | Attending: Family Medicine | Admitting: Family Medicine

## 2021-11-01 ENCOUNTER — Other Ambulatory Visit (HOSPITAL_COMMUNITY)
Admission: RE | Admit: 2021-11-01 | Discharge: 2021-11-01 | Disposition: A | Discharge status: Home / Self Care | Payer: BC Managed Care – PPO | Source: Ambulatory Visit | Attending: Family | Admitting: Family

## 2021-11-01 DIAGNOSIS — I10 Essential (primary) hypertension: Secondary | ICD-10-CM | POA: Insufficient documentation

## 2021-11-01 DIAGNOSIS — I25118 Atherosclerotic heart disease of native coronary artery with other forms of angina pectoris: Secondary | ICD-10-CM | POA: Insufficient documentation

## 2021-11-01 DIAGNOSIS — E1169 Type 2 diabetes mellitus with other specified complication: Secondary | ICD-10-CM | POA: Diagnosis present

## 2021-11-01 DIAGNOSIS — R233 Spontaneous ecchymoses: Secondary | ICD-10-CM | POA: Insufficient documentation

## 2021-11-01 LAB — CBC
HCT: 37.4 % (ref 36.0–46.0)
Hemoglobin: 11.7 g/dL — ABNORMAL LOW (ref 12.0–15.0)
MCH: 26.2 pg (ref 26.0–34.0)
MCHC: 31.3 g/dL (ref 30.0–36.0)
MCV: 83.9 fL (ref 80.0–100.0)
Platelets: 287 10*3/uL (ref 150–400)
RBC: 4.46 MIL/uL (ref 3.87–5.11)
RDW: 15.2 % (ref 11.5–15.5)
WBC: 5.4 10*3/uL (ref 4.0–10.5)
nRBC: 0 % (ref 0.0–0.2)

## 2021-11-01 LAB — COMPREHENSIVE METABOLIC PANEL
ALT: 31 U/L (ref 0–44)
AST: 24 U/L (ref 15–41)
Albumin: 3.7 g/dL (ref 3.5–5.0)
Alkaline Phosphatase: 71 U/L (ref 38–126)
Anion gap: 10 (ref 5–15)
BUN: 14 mg/dL (ref 6–20)
CO2: 24 mmol/L (ref 22–32)
Calcium: 8.9 mg/dL (ref 8.9–10.3)
Chloride: 106 mmol/L (ref 98–111)
Creatinine, Ser: 0.68 mg/dL (ref 0.44–1.00)
GFR, Estimated: >60 mL/min (ref >60)
Glucose, Bld: 223 mg/dL — ABNORMAL HIGH (ref 70–99)
Potassium: 3.5 mmol/L (ref 3.5–5.1)
Sodium: 140 mmol/L (ref 135–145)
Total Bilirubin: 0.5 mg/dL (ref 0.3–1.2)
Total Protein: 7.1 g/dL (ref 6.5–8.1)

## 2021-11-01 LAB — HEMOGLOBIN A1C
Hgb A1c MFr Bld: 9.5 % — ABNORMAL HIGH (ref 4.8–5.6)
Mean Plasma Glucose: 225.95 mg/dL

## 2021-11-02 ENCOUNTER — Encounter (HOSPITAL_COMMUNITY): Payer: BC Managed Care – PPO

## 2021-11-04 ENCOUNTER — Encounter (HOSPITAL_COMMUNITY): Payer: BC Managed Care – PPO

## 2021-11-07 ENCOUNTER — Encounter (HOSPITAL_COMMUNITY): Payer: BC Managed Care – PPO

## 2021-11-11 ENCOUNTER — Ambulatory Visit (INDEPENDENT_AMBULATORY_CARE_PROVIDER_SITE_OTHER): Payer: BC Managed Care – PPO | Admitting: Family

## 2021-11-11 ENCOUNTER — Encounter (HOSPITAL_BASED_OUTPATIENT_CLINIC_OR_DEPARTMENT_OTHER): Payer: Self-pay | Admitting: Family

## 2021-11-11 VITALS — BP 122/74 | HR 70 | Ht 69.0 in | Wt 277.0 lb

## 2021-11-11 DIAGNOSIS — R600 Localized edema: Secondary | ICD-10-CM

## 2021-11-11 DIAGNOSIS — I25118 Atherosclerotic heart disease of native coronary artery with other forms of angina pectoris: Secondary | ICD-10-CM | POA: Diagnosis not present

## 2021-11-11 DIAGNOSIS — E785 Hyperlipidemia, unspecified: Secondary | ICD-10-CM

## 2021-11-11 DIAGNOSIS — I493 Ventricular premature depolarization: Secondary | ICD-10-CM

## 2021-11-11 DIAGNOSIS — I10 Essential (primary) hypertension: Secondary | ICD-10-CM

## 2021-11-11 MED ORDER — HYDROCHLOROTHIAZIDE 25 MG PO TABS: 25.0000 mg | ORAL_TABLET | Freq: Every day | ORAL | 3 refills | Status: DC

## 2021-11-11 NOTE — Patient Instructions (Addendum)
Medication Instructions:  Your physician has recommended you make the following change in your medication:   START  Hydrochlorothiazide one 25mg  tablet daily  CONTINUE Aspirin and Clopidogrel (Plavix). You may stop your Clopidogrel (Plavix) on 01/21/2022  *If you need a refill on your cardiac medications before your next appointment, please call your pharmacy*   Lab Work: Your physician recommends that you return for lab work in 2 weeks for BMP   If you have labs (blood work) drawn today and your tests are completely normal, you will receive your results only by: [place] (if you have MyChart) OR A paper copy in the mail If you have any lab test that is abnormal or we need to change your treatment, we will call you to review the results.  Please return for Lab work. You may come to the...   [facility] (3rd floor) [address], [place], [place] [address]  Open: 8am-Noon and 1pm-4:30pm  Please ring the doorbell on the small table when you exit the elevator and the Lab Tech will come get you  [place] [address] [place], [place], [place] [address] Open: 8am-1pm, then 2pm-4:30pm   [place]- Please see attached locations sheet stapled to your lab work with address and hours.    Testing/Procedures: None ordered today.   Follow-Up: At [facility], you and your health needs are our priority.  As part of our continuing mission to provide you with exceptional heart care, we have created designated Provider Care Teams.  These Care Teams include your primary Cardiologist (physician) and Advanced Practice Providers (APPs -  Physician Assistants and Nurse Practitioners) who all work together to provide you with the care you need, when you need it.  We recommend signing up for the patient portal called "MyChart".  Sign up information is provided on this After Visit Summary.  MyChart is used to connect with patients  for Virtual Visits (Telemedicine).  Patients are able to view lab/test results, encounter notes, upcoming appointments, etc.  Non-urgent messages can be sent to your provider as well.   To learn more about what you can do with MyChart, go to [email or web address].    Your next appointment:   As scheduled  Other Instructions  To prevent or reduce lower extremity swelling: Eat a low salt diet. Salt makes the body hold onto extra fluid which causes swelling. Sit with legs elevated. For example, in the recliner or on an [place].  Wear knee-high compression stockings during the daytime. Ones labeled 15-20 mmHg provide good compression.  Heart Healthy Diet Recommendations: A low-salt diet is recommended. Meats should be grilled, baked, or boiled. Avoid fried foods. Focus on lean protein sources like fish or chicken with vegetables and fruits. The American Heart Association is a Microbiologist!  American Heart Association Diet and Lifeystyle Recommendations   Exercise recommendations: The American Heart Association recommends 150 minutes of moderate intensity exercise weekly. Try 30 minutes of moderate intensity exercise 4-5 times per week. This could include walking, jogging, or swimming.   Important Information About Sugar

## 2021-11-11 NOTE — Progress Notes (Unsigned)
Office Visit    Patient Name: Melissa Casey Date of Encounter: 11/11/2021  PCP:  [name], MD   [facility]  Cardiologist:  Chilton Si, MD  Advanced Practice Provider:  No care team member to display Electrophysiologist:  None      Chief Complaint    Melissa Casey is a 56 y.o. female presents today for lower extremity edema.  Past Medical History    Past Medical History:  Diagnosis Date   CAD in native artery 07/15/2021   DVT (deep venous thrombosis) (HCC)    remote past, over 20 years ago, left leg   Essential hypertension, benign    GERD (gastroesophageal reflux disease) 07/15/2021   History of COVID-19 08/2019   History of kidney stones    IBS (irritable bowel syndrome)    Premature ventricular contractions (PVCs) (VPCs)    Per Dr. Loleta Chance   Pure hypercholesterolemia 05/23/2021   Type 2 diabetes mellitus (HCC)    Past Surgical History:  Procedure Laterality Date   COLONOSCOPY N/A 07/26/2015   Procedure: COLONOSCOPY;  Surgeon: West Bali, MD;  Location: [facility];  Service: Endoscopy;  Laterality: N/A;  130 - moved to 6/26 @1 :30 - office to notify pt   CORONARY STENT INTERVENTION N/A 07/21/2021   Procedure: CORONARY STENT INTERVENTION;  Surgeon: 07/23/2021, MD;  Location: [facility] INVASIVE CV LAB;  Service: Cardiovascular;  Laterality: N/A;   CYSTOSCOPY WITH RETROGRADE PYELOGRAM, URETEROSCOPY AND STENT PLACEMENT Right 12/24/2019   Procedure: CYSTOSCOPY WITH RETROGRADE PYELOGRAM, URETEROSCOPY AND STENT PLACEMENT;  Surgeon: 12/26/2019, MD;  Location: [facility] ORS;  Service: Urology;  Laterality: Right;   CYSTOSCOPY/URETEROSCOPY/HOLMIUM LASER/STENT PLACEMENT Right 01/27/2020   Procedure: CYSTOSCOPY/URETEROSCOPY/HOLMIUM LASER/STENT PLACEMENT;  Surgeon: 01/29/2020, MD;  Location: [facility] ORS;  Service: Urology;  Laterality: Right;   ESOPHAGOGASTRODUODENOSCOPY N/A 07/26/2015   Procedure: ESOPHAGOGASTRODUODENOSCOPY (EGD);   Surgeon: 07/28/2015, MD;  Location: [facility];  Service: Endoscopy;  Laterality: N/A;   LEFT HEART CATH AND CORONARY ANGIOGRAPHY N/A 07/21/2021   Procedure: LEFT HEART CATH AND CORONARY ANGIOGRAPHY;  Surgeon: 07/23/2021, MD;  Location: [facility] INVASIVE CV LAB;  Service: Cardiovascular;  Laterality: N/A;    Allergies  No Known Allergies  History of Present Illness    Melissa Casey is a 56 y.o. female with a hx of HTN, HLD, DM2, obesity, CAD (07/21/21 DES to distal Cx) last seen  09/23/21.  Prior monitor 03/2017 frequent PVC in setting of hypokalemia which were improved with Metoprolol. Echo 03/2017 EF 60-65% with normal diastolic function. Had COVID-19 01/2021. At follow up 04/2021 noted chest pain with following cardiac CTA with calcium score 579 placing her in 99th percentile. Noted significant stenosis to Cx by FFR. Underwent LHC 07/21/21 with PCI of distal circumflex, mid Cx 25%, 2nd diag 50%, mid LAD 25%. Her EF 55-65%. Recommended for DAPT ASA/Plavix for 6 mos then consider Plavix monotherapy. At follow up 07/28/21 she was doing well from cardiac perspective and encouraged to participate in cardiac rehab. Hydralazine added due to elevated BP.  Last seen 09/23/21 doing well from cardiac perspective and no changes made. ***  Presents today for LE edema. 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 Weight up 3 pounds since last clinic visit. Notes lower extremity edema bilateral ankles. It is about the same all day long. 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 Notes some swelling in her bilateral hands. BP at home 140/74.   ***  EKGs/Labs/Other Studies Reviewed:   The following studies were reviewed today:  LHC 07/21/21      Mid Cx lesion is 25% stenosed.   2nd Diag lesion is 50% stenosed.   Mid LAD lesion is 25% stenosed.   Mid Cx to Dist Cx lesion is 75% stenosed.  A drug-eluting stent was successfully placed using a SYNERGY XD 2.25X20, post dilated to  2.5 mm.   Post intervention, there is a 0% residual stenosis.   The left ventricular systolic function is normal.   LV end diastolic pressure is mildly elevated.   The left ventricular ejection fraction is 55-65% by visual estimate.   There is no aortic valve stenosis.   Severe right subclavian tortuosity.  If emergent cath was needed, would not use right radial approach in the future.  May need to consider using destination sheath to help straighten the tortuosity.   Successful PCI of the distal circumflex.  Continue dual antiplatelet therapy for at least 6 months.  Consider clopidogrel monotherapy after that time.  Continue aggressive secondary prevention and risk factor management.   Plan for same-day discharge.  Results conveyed to the son, Quita Skye [id number].   Diagnostic Dominance: Right  Intervention   Cardiac CTA 06/07/21 IMPRESSION: 1. Significantly reduced signal to noise ratio limits the diagnostic quality of this scan.   2. Severe CAD in the mid-distal LAD and LCx, CADRADS = 4. CT FFR will be performed and reported separately.   3. Coronary calcium score is 579, which places the patient in the 99th percentile for age and sex matched control.   4. Normal coronary origins with right dominance.   5. Mild dilation of main pulmonary artery, 30 mm, may indicate pulmonary hypertension.   1. Left Main: FFR = 0.97   2. LAD: Proximal FFR = 0.91, mid FFR = 0.83, Distal FFR = 0.73, D1 FFR = 0.84 3. LCX: Proximal FFR = 0.93, distal FFR = 0.69, OM1 FFR = 0.93 4. RCA: Proximal FFR = 0.95, mid FFR =0.88, distal FFR = 0.80   IMPRESSION: 1. CT FFR analysis showed significant stenosis in the distal LCx lesion, 0.69. The vessel measures approximately 2 mm just proximal to this lesion.   2. Mid-distal LAD lesions does not demonstrate hemodynamically significant stenosis nor significant FFR delta. The distal most LAD has reduced FFR but that may be secondary to vessel tapering  and diffuse disease. Vessel is <2 mm distally.   RECOMMENDATIONS: Guideline-directed medical therapy and aggressive risk factor modification for secondary prevention of coronary artery disease. If refractory symptoms, consider coronary angiography or alternative imaging modality given reduced quality of coronary CTA.  EKG:  No EKG is ordered today  Recent Labs: 11/01/2021: ALT 31; BUN 14; Creatinine, Ser 0.68; Hemoglobin 11.7; Platelets 287; Potassium 3.5; Sodium 140  Recent Lipid Panel    Component Value Date/Time   CHOL 123 07/13/2021 0903   TRIG 98 07/13/2021 0903   HDL 47 07/13/2021 0903   CHOLHDL 2.6 07/13/2021 0903   LDLCALC 58 07/13/2021 0903    Home Medications   No outpatient medications have been marked as taking for the 11/11/21 encounter (Appointment) with Loel Dubonnet, NP.     Review of Systems      All other systems reviewed and are otherwise negative except as noted above.  Physical Exam    VS:  LMP 07/24/2015 Comment: currently experiencing cycle at this time , BMI There is no height or weight on file to calculate BMI.  Wt Readings  from Last 3 Encounters:  09/23/21 274 lb 11.2 oz (124.6 kg)  08/16/21 274 lb 11.1 oz (124.6 kg)  07/28/21 275 lb (124.7 kg)     GEN: Well nourished, well developed, in no acute distress. HEENT: normal. Neck: Supple, no JVD, carotid bruits, or masses. Cardiac: RRR, no murmurs, rubs, or gallops. No clubbing, cyanosis, edema.  Radials/PT 2+ and equal bilaterally.  Respiratory:  Respirations regular and unlabored, clear to auscultation bilaterally. GI: Soft, nontender, nondistended. MS: No deformity or atrophy. Skin: Warm and dry, no rash. Neuro:  Strength and sensation are intact. Psych: Normal affect.  Assessment & Plan    CAD - 06/2021 DES to distal Dx. Stable with no anginal symptoms. No indication for ischemic evaluation.   GDMT includes aspirin/plavix (DAPT for at least 6 mos), Amlodipine, Coreg, Atorvastatin.  Heart healthy diet and regular cardiovascular exercise encouraged.  Update CBC due to bruising with DAPT. ***  HTN - BP not at goal <130/80. Continue Coreg 25mg  BID, Telmisartan 80mg  QD, Amlodipine 10mg  QD, Hydralazine 25mg  BID. Discussed to monitor BP at home at least 2 hours after medications and sitting for 5-10 minutes. ***  DM2  - Continue to follow with PCP. ***  HLD, LDL goal <70 - Continue Atorvastatin 40mg  QD. ***  Obesity - Weight loss via diet and exercise encouraged. Discussed the impact being overweight would have on cardiovascular risk. ***  PVC - Mild, not bothersome.  Continue Carvedilol. Recommend increasing fluid intake, avoiding caffeine/etoh, managing stress well. ***  GERD - Continue to follow with PCP. Continue Protonix 40mg  QD. ***     Disposition: Follow up in 6 month(s) ***with , MD or APP.  Signed, , NP 11/11/2021, 1:43 PM [facility]

## 2021-11-12 ENCOUNTER — Encounter (HOSPITAL_BASED_OUTPATIENT_CLINIC_OR_DEPARTMENT_OTHER): Payer: Self-pay | Admitting: Family

## 2021-12-11 ENCOUNTER — Other Ambulatory Visit (HOSPITAL_BASED_OUTPATIENT_CLINIC_OR_DEPARTMENT_OTHER): Payer: Self-pay | Admitting: Cardiovascular Disease

## 2021-12-12 NOTE — Telephone Encounter (Signed)
Rx(s) sent to pharmacy electronically.  

## 2021-12-16 ENCOUNTER — Telehealth: Payer: Self-pay | Admitting: Cardiovascular Disease

## 2021-12-16 NOTE — Telephone Encounter (Signed)
Patient was returning call, but we got disconnect. Please advise

## 2021-12-16 NOTE — Telephone Encounter (Signed)
Returned call to patient, patient needed to know if she could get her labs drawn without her slips. Advised patient that as long as she is going to a [facility] they should be able to draw her.

## 2021-12-16 NOTE — Telephone Encounter (Signed)
Returned call to patient. Patient was seen by PCP on Monday and her feet are swollen and the back of her calf is hurting. PCP encouraged her to make an appointment to be seen. Pain started about a week ago, no heat, redness or discoloration in the area. Endorses some weight gain recently, about 8 pounds in the last week or 2. Patient is overdue for labs and RN encouraged her to have those drawn either today or Monday. Patient scheduled for 10/27 with Gillian Shields, NP.   Routing to Gillian Shields, NP as Lorain Childes to keep eye out for labs.

## 2021-12-16 NOTE — Telephone Encounter (Signed)
Pt c/o swelling: STAT is pt has developed SOB within 24 hours  If swelling, where is the swelling located? feet  How much weight have you gained and in what time span? Not sure   Have you gained 3 pounds in a day or 5 pounds in a week? Not sure  Do you have a log of your daily weights (if so, list)? Not sure   Are you currently taking a fluid pill? yes  Are you currently SOB? No   Have you traveled recently? No  Pt states she recently saw her primary and was advised to reach out to C. Dan Humphreys, NP about this issue

## 2021-12-17 LAB — BASIC METABOLIC PANEL WITH GFR
BUN/Creatinine Ratio: 21 (ref 9–23)
BUN: 13 mg/dL (ref 6–24)
CO2: 24 mmol/L (ref 20–29)
Calcium: 9.1 mg/dL (ref 8.7–10.2)
Chloride: 99 mmol/L (ref 96–106)
Creatinine, Ser: 0.63 mg/dL (ref 0.57–1.00)
Glucose: 152 mg/dL — ABNORMAL HIGH (ref 70–99)
Potassium: 3.6 mmol/L (ref 3.5–5.2)
Sodium: 138 mmol/L (ref 134–144)
eGFR: 104 mL/min/{1.73_m2}

## 2021-12-19 ENCOUNTER — Telehealth (HOSPITAL_BASED_OUTPATIENT_CLINIC_OR_DEPARTMENT_OTHER): Payer: Self-pay

## 2021-12-19 DIAGNOSIS — R600 Localized edema: Secondary | ICD-10-CM

## 2021-12-19 MED ORDER — FUROSEMIDE 20 MG PO TABS: 20.0000 mg | ORAL_TABLET | Freq: Every day | ORAL | 3 refills | Status: DC

## 2021-12-19 NOTE — Telephone Encounter (Addendum)
Results called to patient who verbalizes understanding! Patient has an appointment on 11/27, advised patient we will recheck labs at that appointment.     ----- Message from Alver Sorrow, NP sent at 12/18/2021  8:44 PM EST ----- Normal kidney function and electrolytes.   Due to LE edema, stop HCTZ and start Lasix 20mg  daily with BMP in 1 week.

## 2021-12-26 ENCOUNTER — Ambulatory Visit (INDEPENDENT_AMBULATORY_CARE_PROVIDER_SITE_OTHER): Payer: BC Managed Care – PPO | Admitting: Family

## 2021-12-26 ENCOUNTER — Encounter (HOSPITAL_BASED_OUTPATIENT_CLINIC_OR_DEPARTMENT_OTHER): Payer: Self-pay

## 2021-12-26 ENCOUNTER — Encounter (HOSPITAL_BASED_OUTPATIENT_CLINIC_OR_DEPARTMENT_OTHER): Payer: Self-pay | Admitting: Family

## 2021-12-26 VITALS — BP 148/92 | HR 70 | Ht 69.0 in | Wt 279.0 lb

## 2021-12-26 DIAGNOSIS — I25118 Atherosclerotic heart disease of native coronary artery with other forms of angina pectoris: Secondary | ICD-10-CM

## 2021-12-26 DIAGNOSIS — I493 Ventricular premature depolarization: Secondary | ICD-10-CM

## 2021-12-26 DIAGNOSIS — R600 Localized edema: Secondary | ICD-10-CM

## 2021-12-26 DIAGNOSIS — I10 Essential (primary) hypertension: Secondary | ICD-10-CM

## 2021-12-26 DIAGNOSIS — E785 Hyperlipidemia, unspecified: Secondary | ICD-10-CM

## 2021-12-26 DIAGNOSIS — R002 Palpitations: Secondary | ICD-10-CM

## 2021-12-26 MED ORDER — DILTIAZEM HCL ER COATED BEADS 120 MG PO CP24: 120.0000 mg | ORAL_CAPSULE | Freq: Every day | ORAL | 5 refills | Status: DC

## 2021-12-26 MED ORDER — DILTIAZEM HCL ER 120 MG PO TB24: 120.0000 mg | ORAL_TABLET | Freq: Every day | ORAL | 5 refills | Status: DC

## 2021-12-26 NOTE — Patient Instructions (Addendum)
Medication Instructions:  Your physician has recommended you make the following change in your medication:  STOP Amlodipine  START Diltiazem one 120mg  tablet daily  *If you need a refill on your cardiac medications before your next appointment, please call your pharmacy*   Lab Work: Your physician recommends that you return for lab work today: CBC, BMP, magnesium, TSH  If you have labs (blood work) drawn today and your tests are completely normal, you will receive your results only by: MyChart Message (if you have MyChart) OR A paper copy in the mail If you have any lab test that is abnormal or we need to change your treatment, we will call you to review the results.   Testing/Procedures:  Your EKG today was stable with no signs of new blockage or problems with your stent - this is a good result!  Your provider has requested you have a lower extremity duplex at either our [facility] office or [facility].  Your physician has requested that you have an echocardiogram at either our [facility] office or [facility]. Echocardiography is a painless test that uses sound waves to create images of your heart. It provides your doctor with information about the size and shape of your heart and how well your heart's chambers and valves are working. This procedure takes approximately one hour. There are no restrictions for this procedure. Please do NOT wear cologne, perfume, aftershave, or lotions (deodorant is allowed). Please arrive 15 minutes prior to your appointment time.    Follow-Up: At [facility], you and your health needs are our priority.  As part of our continuing mission to provide you with exceptional heart care, we have created designated Provider Care Teams.  These Care Teams include your primary Cardiologist (physician) and Advanced Practice Providers (APPs -  Physician Assistants and Nurse Practitioners) who all work together to provide you with the care you need, when you need  it.  We recommend signing up for the patient portal called "MyChart".  Sign up information is provided on this After Visit Summary.  MyChart is used to connect with patients for Virtual Visits (Telemedicine).  Patients are able to view lab/test results, encounter notes, upcoming appointments, etc.  Non-urgent messages can be sent to your provider as well.   To learn more about what you can do with MyChart, go to [facility].    Your next appointment:   As scheduled with [email or web address]  Other Instructions  To prevent or reduce lower extremity swelling: Eat a low salt diet. Salt makes the body hold onto extra fluid which causes swelling. Sit with legs elevated. For example, in the recliner or on an ottoman.  Wear knee-high compression stockings during the daytime. Ones labeled 15-20 mmHg provide good compression.  Heart Healthy Diet Recommendations: A low-salt diet is recommended. Meats should be grilled, baked, or boiled. Avoid fried foods. Focus on lean protein sources like fish or chicken with vegetables and fruits. The American Heart Association is a [place]!  American Heart Association Diet and Lifeystyle Recommendations   Exercise recommendations: The American Heart Association recommends 150 minutes of moderate intensity exercise weekly. Try 30 minutes of moderate intensity exercise 4-5 times per week. This could include walking, jogging, or swimming.   Important Information About Sugar

## 2021-12-26 NOTE — Progress Notes (Signed)
Office Visit    Patient Name: Melissa Casey Date of Encounter: 12/26/2021  PCP:  Iona Beard, [place]  Cardiologist:  Skeet Latch, MD  Advanced Practice Provider:  No care team member to display Electrophysiologist:  None      Chief Complaint    Melissa Casey is a 56 y.o. female presents today for lower extremity edema.  Past Medical History    Past Medical History:  Diagnosis Date   CAD in native artery 07/15/2021   DVT (deep venous thrombosis) (HCC)    remote past, over 20 years ago, left leg   Essential hypertension, benign    GERD (gastroesophageal reflux disease) 07/15/2021   History of COVID-19 08/2019   History of kidney stones    IBS (irritable bowel syndrome)    Premature ventricular contractions (PVCs) (VPCs)    Per Dr. Berdine Addison   Pure hypercholesterolemia 05/23/2021   Type 2 diabetes mellitus ([place])    Past Surgical History:  Procedure Laterality Date   COLONOSCOPY N/A 07/26/2015   Procedure: COLONOSCOPY;  Surgeon: Danie Binder, MD;  [facility];  Service: Endoscopy;  Laterality: N/A;  130 - moved to 6/26 @1 :30 - office to notify pt   CORONARY STENT INTERVENTION N/A 07/21/2021   Procedure: CORONARY STENT INTERVENTION;  Surgeon: Jettie Booze, MD;  Location: [facility];  Service: Cardiovascular;  Laterality: N/A;   CYSTOSCOPY WITH RETROGRADE PYELOGRAM, URETEROSCOPY AND STENT PLACEMENT Right 12/24/2019   Procedure: CYSTOSCOPY WITH RETROGRADE PYELOGRAM, URETEROSCOPY AND STENT PLACEMENT;  Surgeon: Raynelle Bring, MD;  Location: [facility];  Service: Urology;  Laterality: Right;   CYSTOSCOPY/URETEROSCOPY/HOLMIUM LASER/STENT PLACEMENT Right 01/27/2020   Procedure: CYSTOSCOPY/URETEROSCOPY/HOLMIUM LASER/STENT PLACEMENT;  Surgeon: Robley Fries, MD;  Location: [facility];  Service: Urology;  Laterality: Right;   ESOPHAGOGASTRODUODENOSCOPY N/A 07/26/2015   Procedure: ESOPHAGOGASTRODUODENOSCOPY (EGD);   Surgeon: Danie Binder, MD;  [facility];  Service: Endoscopy;  Laterality: N/A;   LEFT HEART CATH AND CORONARY ANGIOGRAPHY N/A 07/21/2021   Procedure: LEFT HEART CATH AND CORONARY ANGIOGRAPHY;  Surgeon: Jettie Booze, MD;  Location: [facility];  Service: Cardiovascular;  Laterality: N/A;    Allergies  No Known Allergies  History of Present Illness    Melissa Casey is a 56 y.o. female with a hx of HTN, HLD, DM2, obesity, CAD (07/21/21 DES to distal Cx) last seen 11/11/2021.  Prior monitor 03/2017 frequent PVC in setting of hypokalemia which were improved with Metoprolol. Echo 03/2017 EF 60-65% with normal diastolic function. Had COVID-19 01/2021. At follow up 04/2021 noted chest pain with following cardiac CTA with calcium score 579 placing her in 99th percentile. Noted significant stenosis to Cx by FFR. Underwent LHC 07/21/21 with PCI of distal circumflex, mid Cx 25%, 2nd diag 50%, mid LAD 25%. Her EF 55-65%. Recommended for DAPT ASA/Plavix for 6 mos then consider Plavix monotherapy. At follow up 07/28/21 she was doing well from cardiac perspective and encouraged to participate in cardiac rehab. Hydralazine added due to elevated BP.  Seen 09/23/21 doing well from cardiac perspective and no changes made.  A follow-up 11/08/2021 noted lower extremity edema and hydrochlorothiazide had been discontinued unclear date.  It was resumed at 25 mg daily.  Swelling presumed due to venous insufficiency.  Via subsequent MyChart encounter and based on labs her hydrochlorothiazide was stopped and transition to Lasix 20 mg daily.  Resents today for follow-up. Notes swelling in her bilateral  legs for 2 weeks, right greater than left. Slight improvement since starting Lasix Friday. No edema appreciated on exam.  She notes "swelling and hurting" in her right calf which she describes. It is more noticeable as a soreness in her knee.  She also notes swelling in her knee.  No recent airline  travel nor surgery. Tells her "heart was doing funny things" at night described as "fluttering and skipping" most often at night. Denies missed doses of Carvedilol. Does note she has been drinking more coffee. However, drinking 5 bottles of water per day. Her blood pressure at home 154/90, has been persistently elevated.   EKGs/Labs/Other Studies Reviewed:   The following studies were reviewed today:  LHC 07/21/21      Mid Cx lesion is 25% stenosed.   2nd Diag lesion is 50% stenosed.   Mid LAD lesion is 25% stenosed.   Mid Cx to Dist Cx lesion is 75% stenosed.  A drug-eluting stent was successfully placed using a SYNERGY XD 2.25X20, post dilated to 2.5 mm.   Post intervention, there is a 0% residual stenosis.   The left ventricular systolic function is normal.   LV end diastolic pressure is mildly elevated.   The left ventricular ejection fraction is 55-65% by visual estimate.   There is no aortic valve stenosis.   Severe right subclavian tortuosity.  If emergent cath was needed, would not use right radial approach in the future.  May need to consider using destination sheath to help straighten the tortuosity.   Successful PCI of the distal circumflex.  Continue dual antiplatelet therapy for at least 6 months.  Consider clopidogrel monotherapy after that time.  Continue aggressive secondary prevention and risk factor management.   Plan for same-day discharge.  Results conveyed to the son, Madelaine Bhat [phone].   Diagnostic Dominance: Right  Intervention   Cardiac CTA 06/07/21 IMPRESSION: 1. Significantly reduced signal to noise ratio limits the diagnostic quality of this scan.   2. Severe CAD in the mid-distal LAD and LCx, CADRADS = 4. CT FFR will be performed and reported separately.   3. Coronary calcium score is 579, which places the patient in the 99th percentile for age and sex matched control.   4. Normal coronary origins with right dominance.   5. Mild dilation of main  pulmonary artery, 30 mm, may indicate pulmonary hypertension.   1. Left Main: FFR = 0.97   2. LAD: Proximal FFR = 0.91, mid FFR = 0.83, Distal FFR = 0.73, D1 FFR = 0.84 3. LCX: Proximal FFR = 0.93, distal FFR = 0.69, OM1 FFR = 0.93 4. RCA: Proximal FFR = 0.95, mid FFR =0.88, distal FFR = 0.80   IMPRESSION: 1. CT FFR analysis showed significant stenosis in the distal LCx lesion, 0.69. The vessel measures approximately 2 mm just proximal to this lesion.   2. Mid-distal LAD lesions does not demonstrate hemodynamically significant stenosis nor significant FFR delta. The distal most LAD has reduced FFR but that may be secondary to vessel tapering and diffuse disease. Vessel is <2 mm distally.   RECOMMENDATIONS: Guideline-directed medical therapy and aggressive risk factor modification for secondary prevention of coronary artery disease. If refractory symptoms, consider coronary angiography or alternative imaging modality given reduced quality of coronary CTA.  EKG:  EKG is ordered today. EKG performed today demonstrates NSR 72 bpm with nonspecific T wave changes. Slight slur of TWI in V5, V6 which is stable.  Recent Labs: 11/01/2021: ALT 31; Hemoglobin 11.7; Platelets 287 12/16/2021: BUN  13; Creatinine, Ser 0.63; Potassium 3.6; Sodium 138  Recent Lipid Panel    Component Value Date/Time   CHOL 123 07/13/2021 0903   TRIG 98 07/13/2021 0903   HDL 47 07/13/2021 0903   CHOLHDL 2.6 07/13/2021 0903   LDLCALC 58 07/13/2021 0903    Home Medications   Current Meds  Medication Sig   amLODipine (NORVASC) 10 MG tablet Take 10 mg by mouth in the morning.   aspirin EC 81 MG tablet Take 81 mg by mouth in the morning. Swallow whole.   atorvastatin (LIPITOR) 40 MG tablet Take 1 tablet (40 mg total) by mouth every evening.   carvedilol (COREG) 25 MG tablet Take 1 tablet (25 mg total) by mouth 2 (two) times daily.   clopidogrel (PLAVIX) 75 MG tablet Take 1 tablet (75 mg total) by mouth daily.    furosemide (LASIX) 20 MG tablet Take 1 tablet (20 mg total) by mouth daily.   hydrALAZINE (APRESOLINE) 25 MG tablet Take 1 tablet (25 mg total) by mouth in the morning and at bedtime.   ibuprofen (ADVIL) 800 MG tablet Take 800 mg by mouth. Every 8-12 hours as needed   Insulin Glargine (BASAGLAR KWIKPEN [place]) Inject into the skin. Sliding Scale: 150 and above=35 units/ 120-149=30 units/ 95-119=25 units/ 80-94=20 units/ less than 80=None   nitroGLYCERIN (NITROSTAT) 0.4 MG SL tablet Place 1 tablet (0.4 mg total) under the tongue every 5 (five) minutes as needed.   pantoprazole (PROTONIX) 40 MG tablet Take 1 tablet by mouth once daily   sitaGLIPtin-metformin (JANUMET) 50-1000 MG tablet Take 1 tablet by mouth 2 (two) times daily with a meal.   telmisartan (MICARDIS) 80 MG tablet Take 1 tablet (80 mg total) by mouth daily.     Review of Systems      All other systems reviewed and are otherwise negative except as noted above.  Physical Exam    VS:  BP (!) 148/92   Pulse 70   Ht 5\' 9"  (1.753 m)   Wt 279 lb (126.6 kg)   LMP 07/24/2015 Comment: currently experiencing cycle at this time  BMI 41.20 kg/m  , BMI Body mass index is 41.2 kg/m.  Wt Readings from Last 3 Encounters:  12/26/21 279 lb (126.6 kg)  11/11/21 277 lb (125.6 kg)  09/23/21 274 lb 11.2 oz (124.6 kg)    GEN: Well nourished, overweight, well developed, in no acute distress. HEENT: normal. Neck: Supple, no JVD, carotid bruits, or masses. Cardiac: RRR, no murmurs, rubs, or gallops. No clubbing, cyanosis, edema.  Radials/PT 2+ and equal bilaterally.  Respiratory:  Respirations regular and unlabored, clear to auscultation bilaterally. GI: Soft, nontender, nondistended. MS: No deformity or atrophy. Skin: Warm and dry, no rash. Neuro:  Strength and sensation are intact. Psych: Normal affect.  Assessment & Plan    LE edema - Likely venous insufficiency.  No edema appreciated on exam.  Continue Lasix 20 mg daily.  She notes  some tenderness behind the right knee concerning for Baker's cyst-will order lower extremity duplex bilaterally.  Will also order echocardiogram to rule out heart failure.  Low salt diet, elevation, compression stockings encouraged.  BMP, CBC, mag, TSH today.   CAD - 06/2021 DES to distal Dx. notes some chest discomfort associated with tachycardia, management of PVC detailed below.  Symptoms are atypical for angina.  EKG today no acute ST/T wave changes.  No indication for ischemic evaluation.   GDMT includes aspirin/plavix (DAPT for at least 6 mos), Amlodipine, Coreg, Atorvastatin.  Discussed she may stop Clopidogrel 01/21/22. Continue aspirin indefinitely. Heart healthy diet and regular cardiovascular exercise encouraged.    HTN - Continue Coreg 25mg  BID, Telmisartan 80mg  QD,Hydralazine 25mg  BID, Lasix 20mg  QD.  Due to palpitations, stop amlodipine and start diltiazem 120 mg daily.  Discussed to monitor BP at home at least 2 hours after medications and sitting for 5-10 minutes.   DM2  - Continue to follow with PCP.   HLD, LDL goal <70 - Continue Atorvastatin 40mg  QD.   Obesity - Weight loss via diet and exercise encouraged. Discussed the impact being overweight would have on cardiovascular risk.   PVC -Notes episodes of heart racing.  Continue Carvedilol.  Update echocardiogram, as above.  Stop amlodipine and start diltiazem 120 mg daily.  Could be further escalated at follow-up as needed.  Could consider ZIO monitor at follow-up.  Recommend increasing fluid intake, avoiding caffeine/etoh, managing stress well. BMP, CBC, mag, TSH today.   GERD - Continue to follow with PCP. Continue Protonix 40mg  QD.      Disposition: Follow up  as scheduled  with Skeet Latch, MD or APP.  Signed, Loel Dubonnet, NP 12/26/2021, 9:14 AM [place]

## 2021-12-27 LAB — BASIC METABOLIC PANEL
BUN/Creatinine Ratio: 18 (ref 9–23)
BUN: 12 mg/dL (ref 6–24)
CO2: 22 mmol/L (ref 20–29)
Calcium: 9.1 mg/dL (ref 8.7–10.2)
Chloride: 100 mmol/L (ref 96–106)
Creatinine, Ser: 0.65 mg/dL (ref 0.57–1.00)
Glucose: 179 mg/dL — ABNORMAL HIGH (ref 70–99)
Potassium: 4.1 mmol/L (ref 3.5–5.2)
Sodium: 138 mmol/L (ref 134–144)
eGFR: 103 mL/min/{1.73_m2} (ref >59)

## 2021-12-27 LAB — CBC
Hematocrit: 37.4 % (ref 34.0–46.6)
Hemoglobin: 11.8 g/dL (ref 11.1–15.9)
MCH: 25.6 pg — ABNORMAL LOW (ref 26.6–33.0)
MCHC: 31.6 g/dL (ref 31.5–35.7)
MCV: 81 fL (ref 79–97)
Platelets: 270 10*3/uL (ref 150–450)
RBC: 4.61 x10E6/uL (ref 3.77–5.28)
RDW: 14.5 % (ref 11.7–15.4)
WBC: 5.1 10*3/uL (ref 3.4–10.8)

## 2021-12-27 LAB — TSH: TSH: 1.34 u[IU]/mL (ref 0.450–4.500)

## 2021-12-27 LAB — MAGNESIUM: Magnesium: 2 mg/dL (ref 1.6–2.3)

## 2021-12-29 ENCOUNTER — Encounter (HOSPITAL_BASED_OUTPATIENT_CLINIC_OR_DEPARTMENT_OTHER): Payer: Self-pay | Admitting: Family

## 2022-01-04 ENCOUNTER — Telehealth (HOSPITAL_BASED_OUTPATIENT_CLINIC_OR_DEPARTMENT_OTHER): Payer: Self-pay | Admitting: Cardiovascular Disease

## 2022-01-04 NOTE — Telephone Encounter (Signed)
Patient stated she has questions regarding her procedure tomorrow and would like Caitlin to call her back.

## 2022-01-04 NOTE — Telephone Encounter (Signed)
Returned call to patient to answer her questions regarding venous and echo scan on 12/7, patient states that now both her knees are hurting and wanted to see if her scan would cover both knees. Upon reviewing the order and speaking with Gillian Shields, NP patient is scheduled to have both knees checked.

## 2022-01-05 ENCOUNTER — Ambulatory Visit (HOSPITAL_COMMUNITY)
Admission: RE | Admit: 2022-01-05 | Discharge: 2022-01-05 | Disposition: A | Discharge status: Home / Self Care | Payer: BC Managed Care – PPO | Source: Ambulatory Visit | Attending: Family | Admitting: Family

## 2022-01-05 DIAGNOSIS — I25118 Atherosclerotic heart disease of native coronary artery with other forms of angina pectoris: Secondary | ICD-10-CM | POA: Insufficient documentation

## 2022-01-05 DIAGNOSIS — R600 Localized edema: Secondary | ICD-10-CM | POA: Diagnosis present

## 2022-01-05 DIAGNOSIS — R002 Palpitations: Secondary | ICD-10-CM

## 2022-01-05 LAB — ECHOCARDIOGRAM COMPLETE
AR max vel: 2.27 cm2
AV Area VTI: 2.79 cm2
AV Area mean vel: 2.57 cm2
AV Mean grad: 5 mmHg
AV Peak grad: 10.5 mmHg
Ao pk vel: 1.62 m/s
Area-P 1/2: 3.03 cm2
S' Lateral: 2.4 cm

## 2022-01-05 NOTE — Progress Notes (Signed)
*  PRELIMINARY RESULTS* Echocardiogram 2D Echocardiogram has been performed.  [name] 01/05/2022, 3:41 PM

## 2022-01-16 ENCOUNTER — Encounter (HOSPITAL_BASED_OUTPATIENT_CLINIC_OR_DEPARTMENT_OTHER): Payer: BC Managed Care – PPO

## 2022-01-16 ENCOUNTER — Other Ambulatory Visit (HOSPITAL_BASED_OUTPATIENT_CLINIC_OR_DEPARTMENT_OTHER): Payer: BC Managed Care – PPO

## 2022-02-24 ENCOUNTER — Other Ambulatory Visit: Payer: Self-pay | Admitting: Cardiology

## 2022-02-28 ENCOUNTER — Ambulatory Visit (HOSPITAL_BASED_OUTPATIENT_CLINIC_OR_DEPARTMENT_OTHER): Payer: BC Managed Care – PPO | Admitting: Cardiovascular Disease

## 2022-03-30 ENCOUNTER — Encounter (HOSPITAL_BASED_OUTPATIENT_CLINIC_OR_DEPARTMENT_OTHER): Payer: Self-pay | Admitting: Family

## 2022-04-04 ENCOUNTER — Ambulatory Visit (HOSPITAL_BASED_OUTPATIENT_CLINIC_OR_DEPARTMENT_OTHER): Payer: BC Managed Care – PPO | Admitting: Family

## 2022-04-19 ENCOUNTER — Other Ambulatory Visit (HOSPITAL_BASED_OUTPATIENT_CLINIC_OR_DEPARTMENT_OTHER): Payer: Self-pay | Admitting: Family

## 2022-04-20 NOTE — Telephone Encounter (Signed)
Rx(s) sent to pharmacy electronically.  

## 2022-05-01 ENCOUNTER — Ambulatory Visit (INDEPENDENT_AMBULATORY_CARE_PROVIDER_SITE_OTHER): Payer: BC Managed Care – PPO | Admitting: Cardiovascular Disease

## 2022-05-01 ENCOUNTER — Other Ambulatory Visit (INDEPENDENT_AMBULATORY_CARE_PROVIDER_SITE_OTHER): Payer: BC Managed Care – PPO

## 2022-05-01 ENCOUNTER — Encounter (HOSPITAL_BASED_OUTPATIENT_CLINIC_OR_DEPARTMENT_OTHER): Payer: Self-pay | Admitting: Cardiovascular Disease

## 2022-05-01 VITALS — BP 182/84 | HR 72 | Ht 69.0 in | Wt 284.3 lb

## 2022-05-01 DIAGNOSIS — I493 Ventricular premature depolarization: Secondary | ICD-10-CM | POA: Diagnosis not present

## 2022-05-01 DIAGNOSIS — I1A0 Resistant hypertension: Secondary | ICD-10-CM

## 2022-05-01 DIAGNOSIS — I10 Essential (primary) hypertension: Secondary | ICD-10-CM

## 2022-05-01 DIAGNOSIS — E7800 Pure hypercholesterolemia, unspecified: Secondary | ICD-10-CM

## 2022-05-01 DIAGNOSIS — I2510 Atherosclerotic heart disease of native coronary artery without angina pectoris: Secondary | ICD-10-CM

## 2022-05-01 DIAGNOSIS — M79602 Pain in left arm: Secondary | ICD-10-CM | POA: Insufficient documentation

## 2022-05-01 HISTORY — DX: Pain in left arm: M79.602

## 2022-05-01 MED ORDER — SPIRONOLACTONE 25 MG PO TABS: 25.0000 mg | ORAL_TABLET | Freq: Every day | ORAL | 3 refills | Status: DC

## 2022-05-01 MED ORDER — AMLODIPINE BESYLATE 5 MG PO TABS: 5.0000 mg | ORAL_TABLET | Freq: Every day | ORAL | 3 refills | Status: DC

## 2022-05-01 NOTE — Assessment & Plan Note (Addendum)
Blood pressure is very poorly controlled.  Worsen stop amlodipine as above.  No change in her swelling or palpitations.  Stop diltiazem.  Resume amlodipine 5 mg daily.  Start spironolactone 25 mg daily.  Check BMP in a week.  Continue furosemide, carvedilol, hydralazine, and telmisartan.  Check renal artery Dopplers.  Check renal and and aldosterone levels.  TSH was normal.  Continue to limit sodium intake.  Blood pressure goal is less than 130/80.  I suspect that her uncontrolled hypertension is causing her headaches.

## 2022-05-01 NOTE — Assessment & Plan Note (Signed)
She has nerve related pain when sitting at her desk.  She has no exertional symptoms.  This is not ischemic.

## 2022-05-01 NOTE — Assessment & Plan Note (Signed)
Melissa Casey complains of nightly palpitations.  This is not changed since she started diltiazem.  Continue carvedilol.  Her labs have been unremarkable.  We will check a 7-day ZIO monitor.  Will also get a sleep study.  Given that his blood pressure is much worse since stopping amlodipine, we will resume amlodipine 5 mg daily.  Stop diltiazem.

## 2022-05-01 NOTE — Patient Instructions (Signed)
Medication Instructions:  Your physician has recommended you make the following change in your medication:   Stop: Diltiazem   Start: Amlodipine 5mg  daily   Start: Spirolactone 25mg  daily   *If you need a refill on your cardiac medications before your next appointment, please call your pharmacy*   Lab Work: Your physician recommends that you return for lab work today- BNP and renin aldosterone   Please return for Lab work in one week for BMP. You may come to the...   [facility] (3rd floor) [address], [place], [place] [address]  Open: 8am-Noon and 1pm-4:30pm  Please ring the doorbell on the small table when you exit the elevator and the Lab Tech will come get you  [place] [facility] [address] [place], [place], [place] [address] Open: 8am-1pm, then 2pm-4:30pm   [place]- Please see attached locations sheet stapled to your lab work with address and hours.   If you have labs (blood work) drawn today and your tests are completely normal, you will receive your results only by: [place] (if you have MyChart) OR A paper copy in the mail If you have any lab test that is abnormal or we need to change your treatment, we will call you to review the results.   Testing/Procedures: Your physician has recommended that you wear a Zio monitor.   This monitor is a medical device that records the heart's electrical activity. Doctors most often use these monitors to diagnose arrhythmias. Arrhythmias are problems with the speed or rhythm of the heartbeat. The monitor is a small device applied to your chest. You can wear one while you do your normal daily activities. While wearing this monitor if you have any symptoms to push the button and record what you felt. Once you have worn this monitor for the period of time provider prescribed (Usually 14 days), you will return the monitor device in the postage paid box. Once it is returned they  will download the data collected and provide [place] with a report which the provider will then review and we will call you with those results. Important tips:  Avoid showering during the first 24 hours of wearing the monitor. Avoid excessive sweating to help maximize wear time. Do not submerge the device, no hot tubs, and no swimming pools. Keep any lotions or oils away from the patch. After 24 hours you may shower with the patch on. Take brief showers with your back facing the shower head.  Do not remove patch once it has been placed because that will interrupt data and decrease adhesive wear time. Push the button when you have any symptoms and write down what you were feeling. Once you have completed wearing your monitor, remove and place into box which has postage paid and place in your outgoing mailbox.  If for some reason you have misplaced your box then call our office and we can provide another box and/or mail it off for you.  Your physician has requested that you have a renal artery duplex. During this test, an ultrasound is used to evaluate blood flow to the kidneys. Allow one hour for this exam. Do not eat after midnight the day before and avoid carbonated beverages. Take your medications as you usually do.   WatchPAT?  Is a FDA cleared portable home sleep study test that uses a watch and 3 points of contact to monitor 7 different channels, including your heart rate, oxygen saturations, body position, snoring,  and chest motion.  The study is easy to use from the comfort of your own home and accurately detect sleep apnea.  Before bed, you attach the chest sensor, attached the sleep apnea bracelet to your nondominant hand, and attach the finger probe.  After the study, the raw data is downloaded from the watch and scored for apnea events.   For more information: [email or web address]  Patient Testing Instructions:  Do not put battery into the device until bedtime when you  are ready to begin the test. Please call the support number if you need assistance after following the instructions below: 24 hour support line- [phone] or [facility] support at [phone] (option 2)  Download the [employer] AmericanWatchPAT One" app through the google play store or App Store  Be sure to turn on or enable access to bluetooth in [place] on your smartphone/ device  Make sure no other bluetooth devices are on and within the vicinity of your smartphone/ device and WatchPAT watch during testing.  Make sure to leave your smart phone/ device plugged in and charging all night.  When ready for bed:  Follow the instructions step by step in the WatchPAT One App to activate the testing device. For additional instructions, including video instruction, visit the WatchPAT One video on Youtube. You can search for [place] One within Youtube (video is 4 minutes and 18 seconds) or enter: https://youtube/watch?v=BCce_vbiwxE Please note: You will be prompted to enter a Pin to connect via bluetooth when starting the test. The PIN will be assigned to you when you receive the test.  The device is disposable, but it recommended that you retain the device until you receive a call letting you know the study has been received and the results have been interpreted.  We will let you know if the study did not transmit to [place] properly after the test is completed. You do not need to call us to confirm the receipt of the test.  Please complete the test within 48 hours of receiving PIN.   Frequently Asked Questions:  What is Watch [place] one?  A single use fully disposable home sleep apnea testing device and will not need to be returned after completion.  What are the requirements to use WatchPAT one?  The be able to have a successful watchpat one sleep study, you should have your Watch pat one device, your smart phone, watch pat one app, your PIN number and Internet access What type of phone do I need?  You should have a  smart phone that uses Android 5.1 and above or any Iphone with IOS 10 and above How can I download the WatchPAT one app?  Based on your device type search for WatchPAT one app either in google play for android devices or APP store for Iphone's Where will I get my PIN for the study?  Your PIN will be provided by your physician's office. It is used for authentication and if you lose/forget your PIN, please reach out to your providers office.  I do not have Internet at home. Can I do WatchPAT one study?  WatchPAT One needs Internet connection throughout the night to be able to transmit the sleep data. You can use your home/local internet or your cellular's data package. However, it is always recommended to use home/local Internet. It is estimated that between 20MB-30MB will be used with each study.However, the application will be looking for 80MB space in the phone to start the study.  What happens if I lose internet  or bluetooth connection?  During the internet disconnection, your phone will not be able to transmit the sleep data. All the data, will be stored in your phone. As soon as the internet connection is back on, the phone will being sending the sleep data. During the bluetooth disconnection, WatchPAT one will not be able to to send the sleep data to your phone. Data will be kept in the [facility] one until two devices have bluetooth connection back on. As soon as the connection is back on, WatchPAT one will send the sleep data to the phone.  How long do I need to wear the WatchPAT one?  After you start the study, you should wear the device at least 6 hours.  How far should I keep my phone from the device?  During the night, your phone should be within 15 feet.  What happens if I leave the room for restroom or other reasons?  Leaving the room for any reason will not cause any problem. As soon as your get back to the room, both devices will reconnect and will continue to send the sleep data. Can I  use my phone during the sleep study?  Yes, you can use your phone as usual during the study. But it is recommended to put your watchpat one on when you are ready to go to bed.  How will I get my study results?  A soon as you completed your study, your sleep data will be sent to the provider. They will then share the results with you when they are ready.    Follow-Up: At [facility], you and your health needs are our priority.  As part of our continuing mission to provide you with exceptional heart care, we have created designated Provider Care Teams.  These Care Teams include your primary Cardiologist (physician) and Advanced Practice Providers (APPs -  Physician Assistants and Nurse Practitioners) who all work together to provide you with the care you need, when you need it.  We recommend signing up for the patient portal called "MyChart".  Sign up information is provided on this After Visit Summary.  MyChart is used to connect with patients for Virtual Visits (Telemedicine).  Patients are able to view lab/test results, encounter notes, upcoming appointments, etc.  Non-urgent messages can be sent to your provider as well.   To learn more about what you can do with MyChart, go to [email or web address].    Your next appointment:   1 month(s) in [facility] with Dr. Oval Linsey, Laurann [place], NP or pharmD

## 2022-05-01 NOTE — Assessment & Plan Note (Signed)
She underwent left circumflex on 6 PCI 06/2021.  Continue aspirin, atorvastatin, and carvedilol.  Will stop clopidogrel 07/2022.

## 2022-05-01 NOTE — Progress Notes (Signed)
Cardiology Office Note:    Date:  05/01/2022   ID:  Melissa Casey, DOB 19-Nov-1965, MRN [id number]  PCP:  Iona Beard, MD  Cardiologist:  Skeet Latch, MD    Referring MD: Iona Beard, MD   No chief complaint on file.   History of Present Illness:    Melissa Casey is a 57 y.o. female with a hx of hypertension, hyperlipidemia, diabetes milltus type 2, and obesity here today for follow-up. She was initially seen 05/23/2021 for the evaluation and management of chest heaviness at the request of Dr. Berdine Addison. She saw Dr. Berdine Addison 03/2021 and complained of chest discomfort/heaviness with associated sweating, nausea, and L arm pain. The heaviness occurred for 3 nights and each episode lasted 10 minutes. Of note, she endorsed a family history of heart disease in her sister who died of MI at 22 years old. She saw [place], PA-C 03/2017 after presenting to the ED for palpitations and nausea. Monitor showed frequent PVCs in the setting of hypokalemia. She was started on metoprolol tartrate 25 mg BID. She had COVID-19 01/2021. She wore a holter monitor 01/2017 with frequent PVCs and occasional PACs. Echo 03/2017 revealed LVEF 60-65% with normal diastolic function.   She reported nocturnal chest discomfort x2.5 weeks. The discomfort occasionally occurred during the day. She also complained of intermittent left arm tingling with left sided neck and shoulder pain. After walking she would have palpitations and fatigue. At home her BP averaged in the 140's. Telmisartan was increased to 80 mg. To rule out obstructive CAD, a coronary CTA was ordered, which revealed severe CAD in the mid-distal LAD and LCx, CADRADS = 4. Her coronary calcium score was 579.  FFR CT showed significant distal left circumflex disease, but otherwise no clear severe stenoses. Atorvastatin was resumed, and lipids were better controlled on repeat.  At her last appointment, she complained of infrequent fluttering and skipping  palpitations lasting 5-10 minutes. Due to concern for possible atrial fibrillation it was recommended she obtain a [employer]. Her blood pressure was poorly controlled at home and in clinic. Metoprolol was switched to carvedilol 25 mg BID. Also she complained of chest pain while lying down that seemed consistent with GERD, so she was given 40 mg pantoprazole daily. She continued to have symptoms concerning for ischemia despite a regimen of aspirin, amlodipine, and beta-blocker. Therefore, she underwent left heart catheterization 07/21/2021 which revealed 25% stenosis of the mid Cx and the mid LAD, 50% stenosis of the 2nd diagonal, and LVEF 55-65%. There was 75% stenosis of the mid to distal Cx and a DES was successfully placed. Started on aspirin/plavix for at least 6 months. Her blood pressure remained above goal so hydralazine 25 mg BID was added. She saw Laurann Montana 11/11/2021 and noted LE edema of her ankles. She was no longer taking HCTZ; unclear why it was stopped. HCTZ was resumed at 25 mg daily. Subsequently, based on labs 11/2021 HCTZ was transitioned to Lasix 20 mg daily. She continued to have swelling by her follow-up 12/26/2021 with slight improvement since starting Lasix. Due to palpitations, amlodipine was switched to diltiazem 120 mg daily. Discussed she could discontinue Clopidogrel 01/21/22. She had an echo 01/05/2022 showing LVEF 60-65%, moderate LVH, and indeterminate diastolic parameters.  Today, she complains of headaches for several weeks that she describes as her head feeling full, especially in the back of her head or superior to her ears. The headaches often correspond with heart racing while lying down at night. Her  symptoms are frequent, occurring every other night. Of note, she is also actively experiencing a headache today. She denies any significant changes with taking diltiazem. At work while sitting at her desk, she sometimes has "shocking" sensations in her hands, and similar  pain in her left shoulder/arm. Rarely she may use ibuprofen for pain management. She believes she is retaining fluid as her weight continues to increase. She notices swelling mostly in her hands and feet; it is difficult to wear her rings. Her diet is unchanged and she is conscientious of her sodium intake. No alcohol consumption. Her blood pressure is elevated in clinic today at 167/83 (182/84 on recheck). Of note, she has not yet taken her antihypertensives as she usually takes them when she eats at 10:30 AM. She notes her BP has been high lately at other clinic visits and at home. Last night her home BP was 190/84. Since amlodipine was discontinued she denies any significant changes in her swelling. Sometimes she still feels sleepy in the mornings. She endorses daytime somnolence while at work. She denies any chest pain, shortness of breath, lightheadedness, syncope, orthopnea, or PND.   Past Medical History:  Diagnosis Date   CAD in native artery 07/15/2021   DVT (deep venous thrombosis)    remote past, over 20 years ago, left leg   Essential hypertension, benign    GERD (gastroesophageal reflux disease) 07/15/2021   History of COVID-19 08/2019   History of kidney stones    IBS (irritable bowel syndrome)    Left arm pain 05/01/2022   Premature ventricular contractions (PVCs) (VPCs)    Per Dr. Berdine Addison   Pure hypercholesterolemia 05/23/2021   Resistant hypertension 11/16/2010   Type 2 diabetes mellitus     Past Surgical History:  Procedure Laterality Date   COLONOSCOPY N/A 07/26/2015   Procedure: COLONOSCOPY;  Surgeon: Danie Binder, MD;  Location: [facility];  Service: Endoscopy;  Laterality: N/A;  130 - moved to 6/26 @1 :30 - office to notify pt   CORONARY STENT INTERVENTION N/A 07/21/2021   Procedure: CORONARY STENT INTERVENTION;  Surgeon: Jettie Booze, MD;  Location: [facility];  Service: Cardiovascular;  Laterality: N/A;   CYSTOSCOPY WITH RETROGRADE PYELOGRAM,  URETEROSCOPY AND STENT PLACEMENT Right 12/24/2019   Procedure: CYSTOSCOPY WITH RETROGRADE PYELOGRAM, URETEROSCOPY AND STENT PLACEMENT;  Surgeon: Raynelle Bring, MD;  Location: [facility];  Service: Urology;  Laterality: Right;   CYSTOSCOPY/URETEROSCOPY/HOLMIUM LASER/STENT PLACEMENT Right 01/27/2020   Procedure: CYSTOSCOPY/URETEROSCOPY/HOLMIUM LASER/STENT PLACEMENT;  Surgeon: Robley Fries, MD;  Location: [facility];  Service: Urology;  Laterality: Right;   ESOPHAGOGASTRODUODENOSCOPY N/A 07/26/2015   Procedure: ESOPHAGOGASTRODUODENOSCOPY (EGD);  Surgeon: Danie Binder, MD;  Location: [facility];  Service: Endoscopy;  Laterality: N/A;   LEFT HEART CATH AND CORONARY ANGIOGRAPHY N/A 07/21/2021   Procedure: LEFT HEART CATH AND CORONARY ANGIOGRAPHY;  Surgeon: Jettie Booze, MD;  Location: [facility];  Service: Cardiovascular;  Laterality: N/A;    Current Medications: Current Meds  Medication Sig   amLODipine (NORVASC) 5 MG tablet Take 1 tablet (5 mg total) by mouth daily.   aspirin EC 81 MG tablet Take 81 mg by mouth in the morning. Swallow whole.   atorvastatin (LIPITOR) 40 MG tablet Take 1 tablet (40 mg total) by mouth every evening.   carvedilol (COREG) 25 MG tablet Take 1 tablet (25 mg total) by mouth 2 (two) times daily.   clopidogrel (PLAVIX) 75 MG tablet Take 1 tablet by mouth once daily  furosemide (LASIX) 20 MG tablet Take 1 tablet (20 mg total) by mouth daily.   hydrALAZINE (APRESOLINE) 25 MG tablet Take 1 tablet (25 mg total) by mouth in the morning and at bedtime.   ibuprofen (ADVIL) 800 MG tablet Take 800 mg by mouth. Every 8-12 hours as needed   Insulin Glargine (BASAGLAR KWIKPEN [place]) Inject into the skin. Sliding Scale: 150 and above=35 units/ 120-149=30 units/ 95-119=25 units/ 80-94=20 units/ less than 80=None   LINZESS 145 MCG CAPS capsule Take 145 mcg by mouth daily.   nitroGLYCERIN (NITROSTAT) 0.4 MG SL tablet Place 1 tablet (0.4 mg total) under the tongue every 5  (five) minutes as needed.   pantoprazole (PROTONIX) 40 MG tablet Take 1 tablet by mouth once daily   sitaGLIPtin-metformin (JANUMET) 50-1000 MG tablet Take 1 tablet by mouth 2 (two) times daily with a meal.   spironolactone (ALDACTONE) 25 MG tablet Take 1 tablet (25 mg total) by mouth daily.   telmisartan (MICARDIS) 80 MG tablet Take 1 tablet (80 mg total) by mouth daily.   [DISCONTINUED] diltiazem (CARDIZEM CD) 120 MG 24 hr capsule Take 1 capsule by mouth once daily     Allergies:   Patient has no known allergies.   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   Occupation: [employer]     Comment: part time   Occupation:    Tobacco Use   Smoking status: Never   Smokeless tobacco: Never  Vaping Use   Vaping Use: Never used  Substance and Sexual Activity   Alcohol use: No    Alcohol/week: 0.0 standard drinks of alcohol   Drug use: No   Sexual activity: Not on file  Other Topics Concern   Not on file  Social History Narrative   Not on file   Social Determinants of Health   Financial Resource Strain: Low Risk  (05/23/2021)   Overall Financial Resource Strain (CARDIA)    Difficulty of Paying Living Expenses: Not hard at all  Food Insecurity: No Food Insecurity (05/23/2021)   Hunger Vital Sign    Worried About Running Out of Food in the Last Year: Never true    [place] in the Last Year: Never true  Transportation Needs: No Transportation Needs (05/23/2021)   PRAPARE - Hydrologist (Medical): No    Lack of Transportation (Non-Medical): No  Physical Activity: Inactive (05/23/2021)   Exercise Vital Sign    Days of Exercise per Week: 0 days    Minutes of Exercise per Session:  0 min  Stress: Not on file  Social Connections: Not on file     Family History: The patient's family history includes Arrhythmia in her sister; Coronary artery disease in her father and mother; Heart attack (age of onset: 54) in her sister; Heart disease in her brother and father; Hypertension in her brother. There is no history of Colon cancer.  ROS:   Please see the history of present illness.    (+) Headaches (+) Palpitations (+) Shocking pains of hands and left arm. (+) Edema of bilateral hands and feet (+) Daytime somnolence All other systems reviewed and negative.  EKGs/Labs/Other Studies Reviewed:    The following studies were reviewed today:  Echo  01/05/2022: 1. Left ventricular ejection fraction, by estimation, is 60 to 65%. The  left ventricle has normal function. The left ventricle has no regional  wall motion abnormalities. There is moderate left ventricular hypertrophy.  Left ventricular diastolic  parameters are indeterminate.   2. Right ventricular systolic function is normal. The right ventricular  size is normal.   3. The mitral valve is normal in structure. No evidence of mitral valve  regurgitation. No evidence of mitral stenosis.   4. The aortic valve is tricuspid. Aortic valve regurgitation is not  visualized. No aortic stenosis is present.   5. The inferior vena cava is normal in size with greater than 50%  respiratory variability, suggesting right atrial pressure of 3 mmHg.   Left Heart Catheterization 07/21/2021:   Mid Cx lesion is 25% stenosed.   2nd Diag lesion is 50% stenosed.   Mid LAD lesion is 25% stenosed.   Mid Cx to Dist Cx lesion is 75% stenosed.  A drug-eluting stent was successfully placed using a SYNERGY XD 2.25X20, post dilated to 2.5 mm.   Post intervention, there is a 0% residual stenosis.   The left ventricular systolic function is normal.   LV end diastolic pressure is mildly elevated.   The left ventricular ejection fraction is  55-65% by visual estimate.   There is no aortic valve stenosis.   Severe right subclavian tortuosity.  If emergent cath was needed, would not use right radial approach in the future.  May need to consider using destination sheath to help straighten the tortuosity.   Successful PCI of the distal circumflex.  Continue dual antiplatelet therapy for at least 6 months.  Consider clopidogrel monotherapy after that time.  Continue aggressive secondary prevention and risk factor management.  Diagnostic Dominance: Right  Intervention    CT FFR Analysis  06/09/2021: FINDINGS: FFRct analysis was performed on the original cardiac CT angiogram dataset. Diagrammatic representation of the FFRct analysis is provided in a separate PDF document in PACS. This dictation was created using the PDF document and an interactive 3D model of the results. 3D model is not available in the EMR/PACS. Normal FFR range is >0.80. Indeterminate (grey) zone is 0.76-0.80.   1. Left Main: FFR = 0.97   2. LAD: Proximal FFR = 0.91, mid FFR = 0.83, Distal FFR = 0.73, D1 FFR = 0.84 3. LCX: Proximal FFR = 0.93, distal FFR = 0.69, OM1 FFR = 0.93 4. RCA: Proximal FFR = 0.95, mid FFR =0.88, distal FFR = 0.80   IMPRESSION: 1. CT FFR analysis showed significant stenosis in the distal LCx lesion, 0.69. The vessel measures approximately 2 mm just proximal to this lesion.   2. Mid-distal LAD lesions does not demonstrate hemodynamically significant stenosis nor significant FFR delta. The distal most LAD has reduced FFR but that may be secondary to vessel tapering and diffuse disease. Vessel is <2 mm distally.  Coronary CT  06/07/2021: FINDINGS: Image quality: Good   Noise artifact is: Significantly reduced signal to noise ratio, affects accuracy of detecting small plaques.   Coronary calcium score is 579, which places the patient in the 99th percentile for age and sex matched control.   Coronary arteries: Normal coronary  origins.  Right dominance.   Right Coronary Artery: Small caliber yet dominant RCA, with mild atherosclerotic plaque in the mid and distal RCA, 25-49% stenosis.   Left Main Coronary Artery: No detectable  plaque or stenosis.   Left Anterior Descending Coronary Artery: Moderate mixed atherosclerotic plaque in the proximal LAD, mid LAD and proximal first diagonal, 50-69% stenosis. Possible severe stenosis in the mid to distal LAD, 70-99% stenosis.   Left Circumflex Artery: Moderate stenosis in the proximal LCx and possible moderate stenosis in OM1, 50-69% stenosis. Severe mixed atherosclerotic plaque in the mid-distal LCx, 70-99% stenosis.   Aorta: Normal size, 32 mm at the mid ascending aorta (level of the PA bifurcation) measured double oblique. No calcifications. No dissection.   Aortic Valve: No calcifications.   Other findings:   Normal pulmonary vein drainage into the left atrium.   Normal left atrial appendage without thrombus.   Mild dilation of main pulmonary artery, 30 mm, may indicate pulmonary hypertension.   IMPRESSION: 1. Significantly reduced signal to noise ratio limits the diagnostic quality of this scan.   2. Severe CAD in the mid-distal LAD and LCx, CADRADS = 4. CT FFR will be performed and reported separately.   3. Coronary calcium score is 579, which places the patient in the 99th percentile for age and sex matched control.   4. Normal coronary origins with right dominance.   5. Mild dilation of main pulmonary artery, 30 mm, may indicate pulmonary hypertension.  Echo 03/23/17 - Left ventricle: The cavity size was normal. Wall thickness was    increased in a pattern of mild LVH. Systolic function was normal.    The estimated ejection fraction was in the range of 60% to 65%.    Wall motion was normal; there were no regional wall motion    abnormalities. Left ventricular diastolic function parameters    were normal for the patient&'s age.  - Mitral  valve: There was trivial regurgitation.  - Left atrium: The atrium was at the upper limits of normal in    size.  - Right atrium: Central venous pressure (est): 3 mm Hg.  - Tricuspid valve: There was trivial regurgitation.  - Pulmonary arteries: Systolic pressure could not be accurately    estimated.  - Pericardium, extracardiac: There was no pericardial effusion.   Monitor 02/28/17 Sinus rhythm with frequent PVC's and occasional PAC's. No diary was returned.  EKG:  EKG is personally reviewed. 05/01/2022:  EKG was not ordered. 07/15/2021:  EKG was not ordered. 05/23/21: Sinus rhythm rate 73 bpm; cannot rule out prior inferior infarct and poor R-wave progression  Recent Labs: 11/01/2021: ALT 31 12/26/2021: BUN 12; Creatinine, Ser 0.65; Hemoglobin 11.8; Magnesium 2.0; Platelets 270; Potassium 4.1; Sodium 138; TSH 1.340   Recent Lipid Panel    Component Value Date/Time   CHOL 123 07/13/2021 0903   TRIG 98 07/13/2021 0903   HDL 47 07/13/2021 0903   CHOLHDL 2.6 07/13/2021 0903   LDLCALC 58 07/13/2021 0903     Physical Exam:    VS:  BP (!) 182/84 (BP Location: Right Arm, Patient Position: Sitting, Cuff Size: Large)   Pulse 72   Ht 5\' 9"  (1.753 m)   Wt 284 lb 4.8 oz (129 kg)   LMP 07/24/2015 Comment: currently experiencing cycle at this time  BMI 41.98 kg/m  , BMI Body mass index is 41.98 kg/m. GENERAL:  Well appearing HEENT: Pupils equal round and reactive, fundi not visualized, oral mucosa unremarkable NECK:  No jugular venous distention, waveform within normal limits, carotid upstroke brisk and symmetric, no bruits, no thyromegaly LUNGS:  Clear to auscultation bilaterally HEART:  RRR.  PMI not displaced or sustained,S1 and S2 within normal limits, no  S3, no S4, no clicks, no rubs, no murmurs ABD:  Flat, positive bowel sounds normal in frequency in pitch, no bruits, no rebound, no guarding, no midline pulsatile mass, no hepatomegaly, no splenomegaly EXT:  2 plus pulses  throughout, no LE edema, no cyanosis no clubbing SKIN:  No rashes no nodules NEURO:  Cranial nerves II through XII grossly intact, motor grossly intact throughout PSYCH:  Cognitively intact, oriented to person place and time  ASSESSMENT:    1. PVC's (premature ventricular contractions)   2. Essential hypertension, benign   3. CAD in native artery   4. Resistant hypertension   5. Pure hypercholesterolemia     PLAN:    PVC's (premature ventricular contractions) Ms. Mallinger complains of nightly palpitations.  This is not changed since she started diltiazem.  Continue carvedilol.  Her labs have been unremarkable.  We will check a 7-day ZIO monitor.  Will also get a sleep study.  Given that his blood pressure is much worse since stopping amlodipine, we will resume amlodipine 5 mg daily.  Stop diltiazem.  Resistant hypertension Blood pressure is very poorly controlled.  Worsen stop amlodipine as above.  No change in her swelling or palpitations.  Stop diltiazem.  Resume amlodipine 5 mg daily.  Start spironolactone 25 mg daily.  Check BMP in a week.  Continue furosemide, carvedilol, hydralazine, and telmisartan.  Check renal artery Dopplers.  Check renal and and aldosterone levels.  TSH was normal.  Continue to limit sodium intake.  Blood pressure goal is less than 130/80.  I suspect that her uncontrolled hypertension is causing her headaches.  CAD in native artery She underwent left circumflex on 6 PCI 06/2021.  Continue aspirin, atorvastatin, and carvedilol.  Will stop clopidogrel 07/2022.  Pure hypercholesterolemia Lipids are well-controlled on atorvastatin.  Left arm pain She has nerve related pain when sitting at her desk.  She has no exertional symptoms.  This is not ischemic.   Disposition: FU with Finleigh Cheong C. Oval Linsey, MD, [facility] in 1 months.  Medication Adjustments/Labs and Tests Ordered: Current medicines are reviewed at length with the patient today.  Concerns regarding medicines  are outlined above.   Orders Placed This Encounter  Procedures   Brain natriuretic peptide   Aldosterone + renin activity w/ ratio   Basic metabolic panel   LONG TERM MONITOR (3-14 DAYS)   Itamar Sleep Study   VAS US RENAL ARTERY DUPLEX   Meds ordered this encounter  Medications   spironolactone (ALDACTONE) 25 MG tablet    Sig: Take 1 tablet (25 mg total) by mouth daily.    Dispense:  90 tablet    Refill:  3   amLODipine (NORVASC) 5 MG tablet    Sig: Take 1 tablet (5 mg total) by mouth daily.    Dispense:  90 tablet    Refill:  3   I,Mathew Stumpf,acting as a scribe for Skeet Latch, MD.,have documented all relevant documentation on the behalf of Skeet Latch, MD,as directed by  Skeet Latch, MD while in the presence of Skeet Latch, MD.  I, [place] Oval Linsey, MD have reviewed all documentation for this visit.  The documentation of the exam, diagnosis, procedures, and orders on 05/01/2022 are all accurate and complete.  Signed, Skeet Latch, MD  05/01/2022 8:57 AM    [place]

## 2022-05-01 NOTE — Assessment & Plan Note (Signed)
Lipids are well-controlled on atorvastatin. 

## 2022-05-03 ENCOUNTER — Telehealth: Payer: Self-pay | Admitting: *Deleted

## 2022-05-03 NOTE — Telephone Encounter (Signed)
Left detailed message--ok per DPR--letting pt know ok to activate Itamar device. Gave PIN# [id number]. Asked pt to call back with any questions/concerns.

## 2022-05-03 NOTE — Telephone Encounter (Signed)
Secure chat message sent to Alvina Filbert and Ernie Hew ok to activate itamar device. Insurance does not require a PA per web portal.

## 2022-05-06 LAB — ALDOSTERONE + RENIN ACTIVITY W/ RATIO
Aldos/Renin Ratio: 16.8 (ref 0.0–30.0)
Aldosterone: 6.2 ng/dL (ref 0.0–30.0)
Renin Activity, Plasma: 0.37 ng/mL/hr (ref 0.167–5.380)

## 2022-05-06 LAB — BRAIN NATRIURETIC PEPTIDE: BNP: 27.4 pg/mL (ref 0.0–100.0)

## 2022-05-29 ENCOUNTER — Encounter (HOSPITAL_BASED_OUTPATIENT_CLINIC_OR_DEPARTMENT_OTHER): Payer: BC Managed Care – PPO

## 2022-06-12 ENCOUNTER — Ambulatory Visit (HOSPITAL_BASED_OUTPATIENT_CLINIC_OR_DEPARTMENT_OTHER): Payer: BC Managed Care – PPO | Admitting: Cardiovascular Disease

## 2022-06-27 ENCOUNTER — Other Ambulatory Visit (HOSPITAL_BASED_OUTPATIENT_CLINIC_OR_DEPARTMENT_OTHER): Payer: Self-pay | Admitting: Cardiovascular Disease

## 2022-07-14 ENCOUNTER — Encounter (HOSPITAL_BASED_OUTPATIENT_CLINIC_OR_DEPARTMENT_OTHER): Payer: BC Managed Care – PPO

## 2022-08-08 IMAGING — CT CT RENAL STONE PROTOCOL
2 of 4 series · 16 of 46 positions shown, 18 images · non-contrast
Comparison: CT 09/15/2007, ultrasound 08/16/2012

CLINICAL DATA: Right-sided flank pain

EXAM:
CT ABDOMEN AND PELVIS WITHOUT CONTRAST
TECHNIQUE: Multidetector CT imaging of the abdomen and pelvis was performed
following the standard protocol without IV contrast.

[Series 2: axial st · axial · 0.75mm/px · z∈[-614,-154]mm · 13 of 106 slices shown, 15 images]
[im 7/106  soft-tissue]
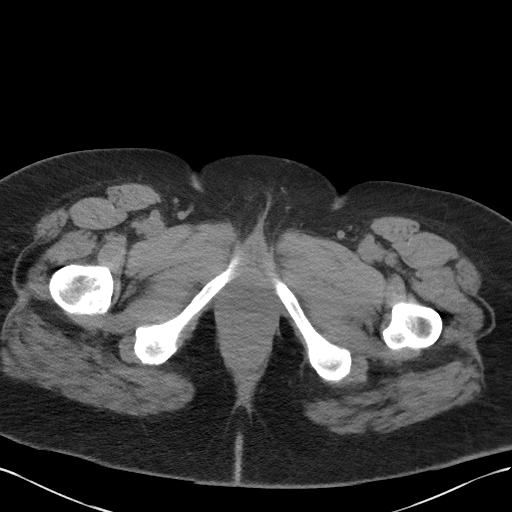
[im 7/106  bone]
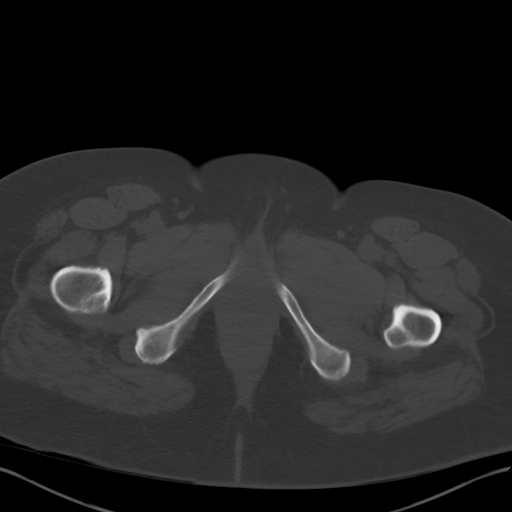
[im 13/106  soft-tissue]
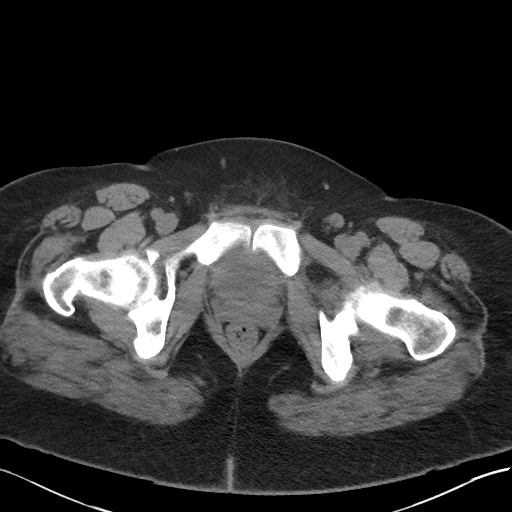
[im 25/106  soft-tissue]
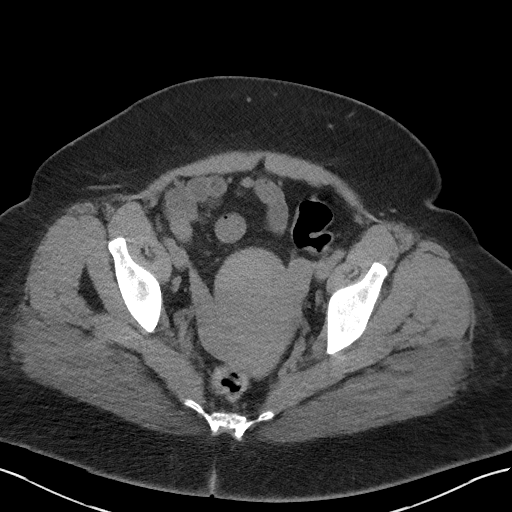
[im 31/106  soft-tissue]
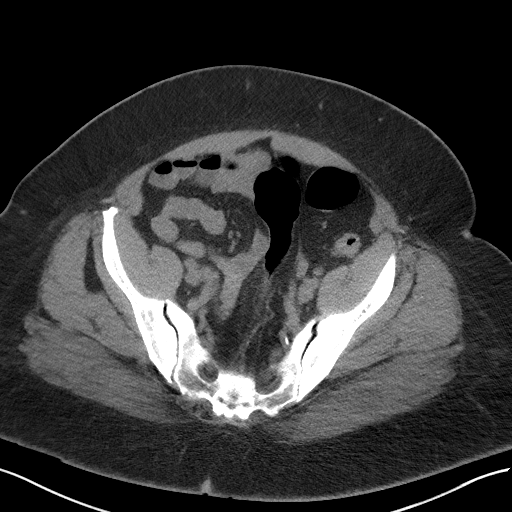
[im 38/106  soft-tissue]
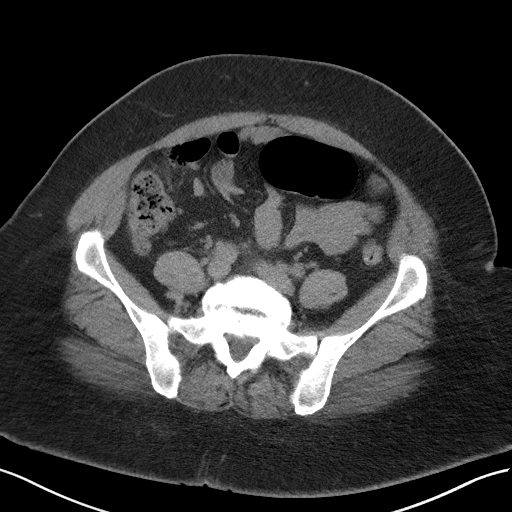
[im 44/106  soft-tissue]
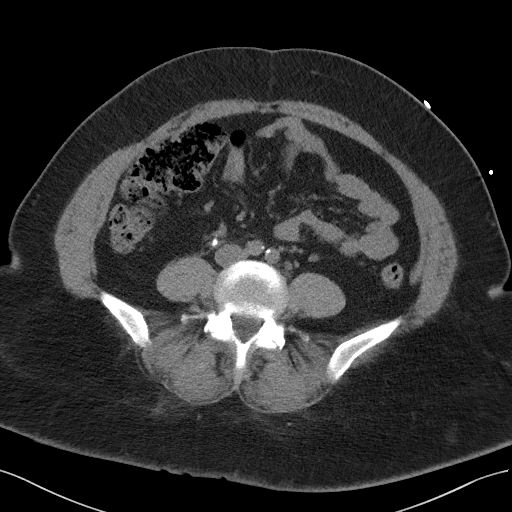
[im 56/106  soft-tissue]
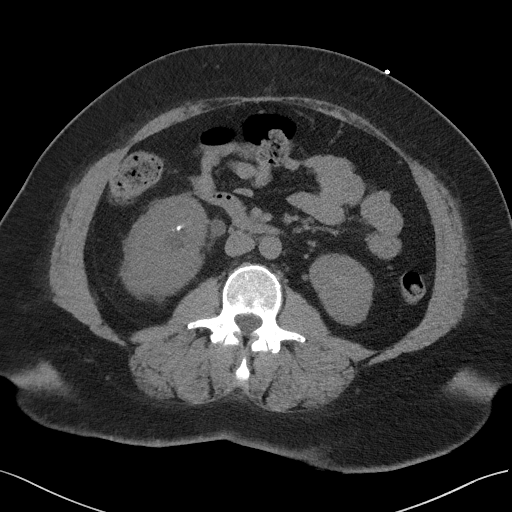
[im 62/106  soft-tissue]
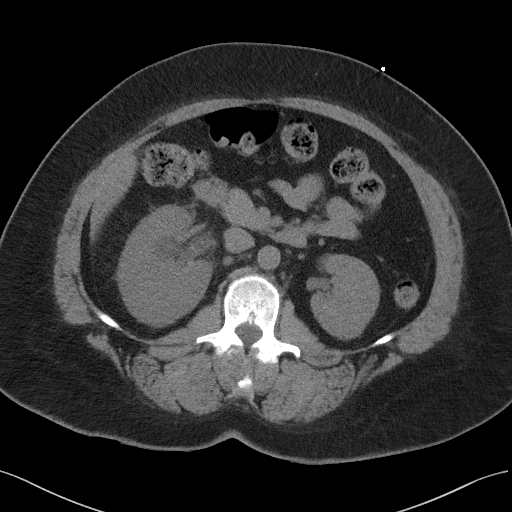
[im 68/106  soft-tissue]
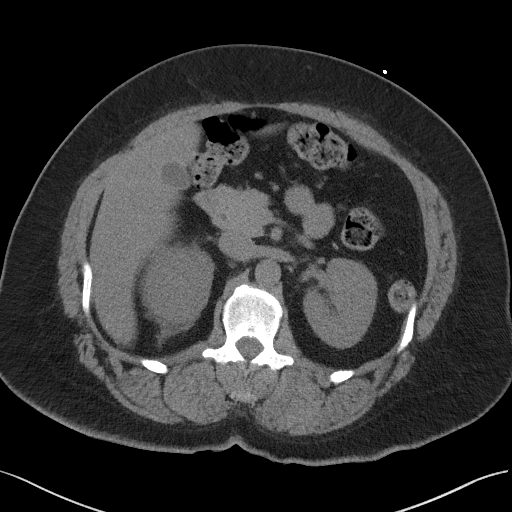
[im 68/106  bone]
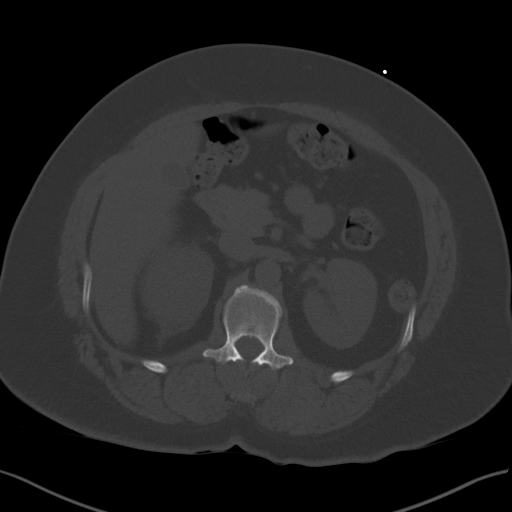
[im 75/106  soft-tissue]
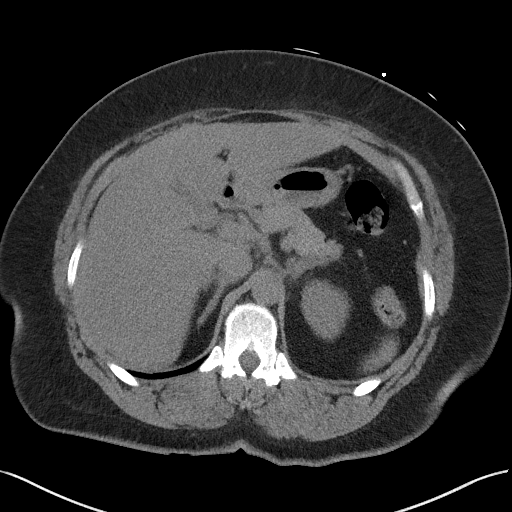
[im 81/106  soft-tissue]
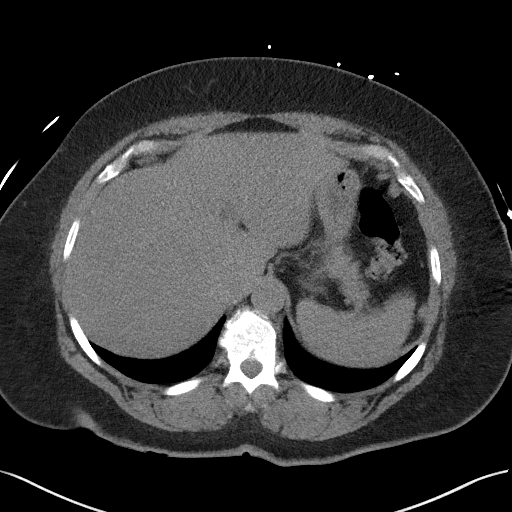
[im 93/106  soft-tissue]
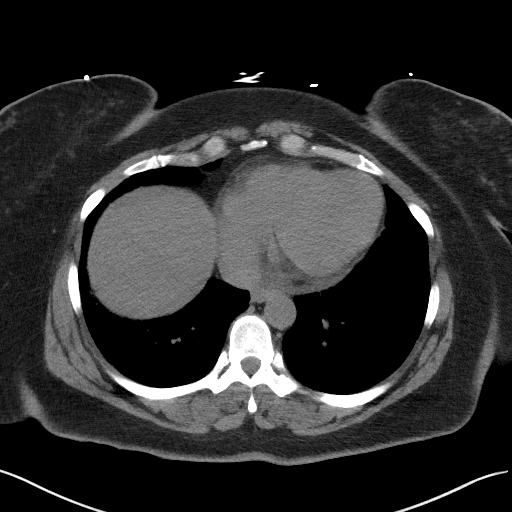
[im 99/106  soft-tissue]
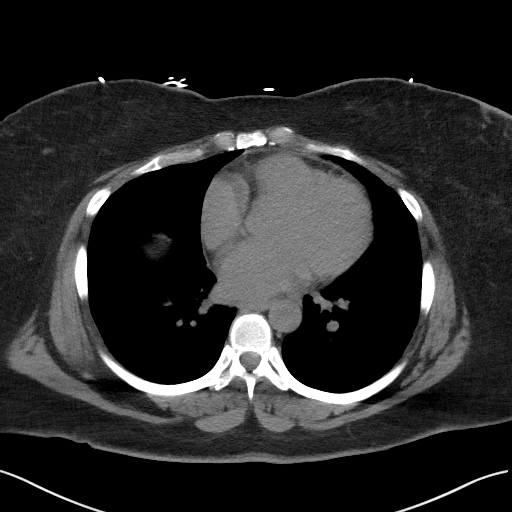

[Series 5: coronal st · coronal · 0.82mm/px · 3 of 103 slices shown]
[im 35/103  soft-tissue]
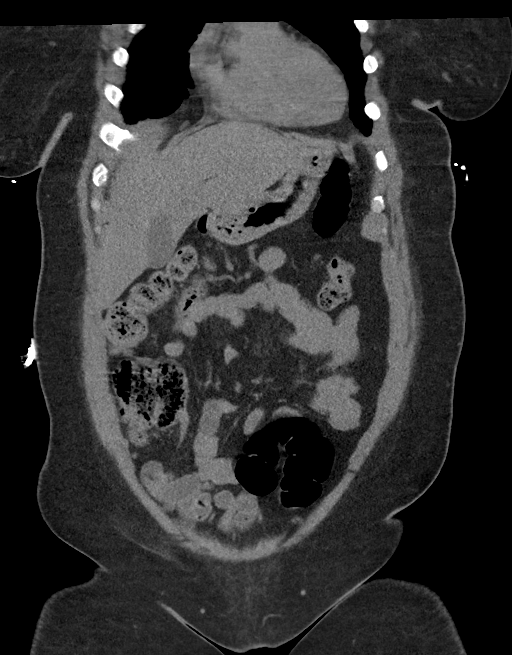
[im 46/103  soft-tissue]
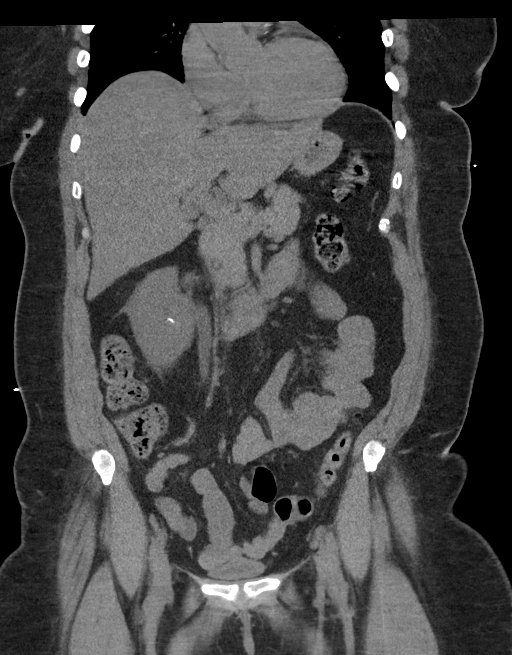
[im 57/103  soft-tissue]
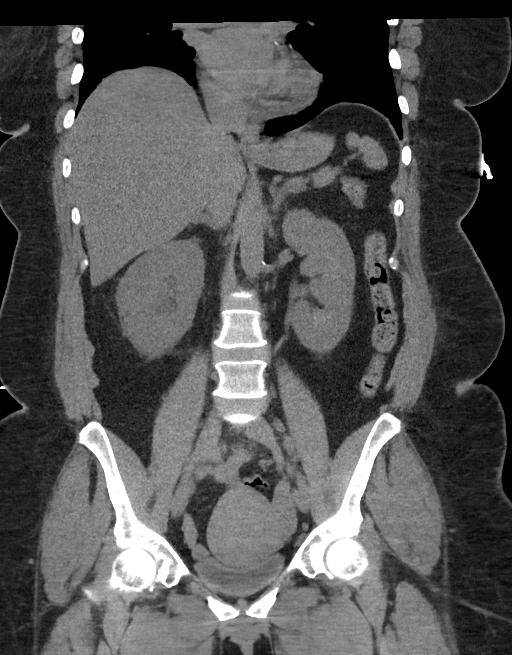

[16 of 46 positions shown; findings below may reference images not displayed]

FINDINGS: Lower chest: Lung bases demonstrate no acute consolidation or
effusion. Scattered atelectasis or scarring at the bases, lingula
and right middle lobe.

Hepatobiliary: Cyst in the left hepatic lobe. No calcified gallstone
or biliary dilatation. Subcentimeter hypodensity in the inferior
right hepatic lobe too small to further characterize

Pancreas: Unremarkable. No pancreatic ductal dilatation or
surrounding inflammatory changes.

Spleen: Normal in size without focal abnormality.

Adrenals/Urinary Tract: Adrenal glands are normal. 3 mm stone
midpole left kidney. Multiple stones in the right kidney, the
largest is seen in the lower pole and measures 5 mm. Mild to
moderate right hydronephrosis and proximal hydroureter, secondary to
a 8 mm stone in the proximal to mid right ureter at the level of the
L4-L5 intervertebral disc space. The bladder is unremarkable.

Stomach/Bowel: Stomach is within normal limits. Appendix appears
normal. No evidence of bowel wall thickening, distention, or
inflammatory changes.

Vascular/Lymphatic: Mild aortic atherosclerosis. No aneurysm. No
suspicious nodes.

Reproductive: Fundal enlargement of the uterus.  No adnexal mass.

Other: No abdominal wall hernia or abnormality. No abdominopelvic
ascites.

Musculoskeletal: Degenerative changes at L5-S1. No acute osseous
abnormality.
IMPRESSION: 1. Mild to moderate right hydronephrosis and proximal hydroureter,
secondary to a 8 mm stone in the proximal to mid right ureter at the
level of the L4-L5 intervertebral disc space.
2. Bilateral intrarenal calculi.
3. Possible uterine fibroids

Aortic Atherosclerosis ([id number]).

## 2022-08-17 ENCOUNTER — Encounter (HOSPITAL_BASED_OUTPATIENT_CLINIC_OR_DEPARTMENT_OTHER): Payer: Self-pay | Admitting: Cardiovascular Disease

## 2022-08-17 ENCOUNTER — Telehealth (HOSPITAL_BASED_OUTPATIENT_CLINIC_OR_DEPARTMENT_OTHER): Payer: Self-pay

## 2022-08-17 ENCOUNTER — Other Ambulatory Visit (HOSPITAL_BASED_OUTPATIENT_CLINIC_OR_DEPARTMENT_OTHER): Payer: Self-pay | Admitting: Family

## 2022-08-17 ENCOUNTER — Ambulatory Visit (INDEPENDENT_AMBULATORY_CARE_PROVIDER_SITE_OTHER): Payer: BC Managed Care – PPO | Admitting: Cardiovascular Disease

## 2022-08-17 ENCOUNTER — Other Ambulatory Visit: Payer: Self-pay | Admitting: Cardiology

## 2022-08-17 VITALS — BP 148/74 | HR 65 | Ht 69.0 in | Wt 282.0 lb

## 2022-08-17 DIAGNOSIS — I10 Essential (primary) hypertension: Secondary | ICD-10-CM | POA: Diagnosis not present

## 2022-08-17 DIAGNOSIS — Z6841 Body Mass Index (BMI) 40.0 and over, adult: Secondary | ICD-10-CM

## 2022-08-17 DIAGNOSIS — R0683 Snoring: Secondary | ICD-10-CM | POA: Diagnosis not present

## 2022-08-17 DIAGNOSIS — R600 Localized edema: Secondary | ICD-10-CM

## 2022-08-17 DIAGNOSIS — R400 Somnolence: Secondary | ICD-10-CM

## 2022-08-17 MED ORDER — FUROSEMIDE 20 MG PO TABS: 20.0000 mg | ORAL_TABLET | Freq: Every day | ORAL | 3 refills | Status: DC

## 2022-08-17 MED ORDER — AMOXICILLIN 500 MG PO TABS: ORAL_TABLET | ORAL | 0 refills | Status: DC

## 2022-08-17 MED ORDER — HYDRALAZINE HCL 50 MG PO TABS: 50.0000 mg | ORAL_TABLET | Freq: Two times a day (BID) | ORAL | 3 refills | Status: DC

## 2022-08-17 NOTE — Telephone Encounter (Signed)
Patient returned opened Itamar device, she states she calls and informed someone that she does not have enough wifi for the device to work. Order cancelled and device unregistered in [facility] portal.   Forwarded to [employer] and heart care billing as Lorain Childes

## 2022-08-17 NOTE — Telephone Encounter (Signed)
Of note, split night sleep study already ordered to ensure testing completed.   Alver Sorrow, NP

## 2022-08-17 NOTE — Progress Notes (Signed)
Cardiology Office Note:    Date:  08/17/2022   ID:  Melissa Casey, DOB 04-Jul-1965, MRN [id number]  PCP:  [name], MD  Cardiologist:  Chilton Si, MD    Referring MD: [name], MD    History of Present Illness:    Melissa Casey is a 57 y.o. female with a hx of hypertension, hyperlipidemia, diabetes milltus type 2, and obesity here today for follow-up. She was initially seen 05/23/2021 for the evaluation and management of chest heaviness at the request of Dr. Loleta Chance. She saw Dr. Loleta Chance 03/2021 and complained of chest discomfort/heaviness with associated sweating, nausea, and L arm pain. The heaviness occurred for 3 nights and each episode lasted 10 minutes. Of note, she endorsed a family history of heart disease in her sister who died of MI at 42 years old. She saw [place], PA-C 03/2017 after presenting to the ED for palpitations and nausea. Monitor showed frequent PVCs in the setting of hypokalemia. She was started on metoprolol tartrate 25 mg BID. She had COVID-19 01/2021. She wore a holter monitor 01/2017 with frequent PVCs and occasional PACs. Echo 03/2017 revealed LVEF 60-65% with normal diastolic function.   She reported nocturnal chest discomfort x2.5 weeks. The discomfort occasionally occurred during the day. She also complained of intermittent left arm tingling with left sided neck and shoulder pain. After walking she would have palpitations and fatigue. At home her BP averaged in the 140's. Telmisartan was increased to 80 mg. To rule out obstructive CAD, a coronary CTA was ordered, which revealed severe CAD in the mid-distal LAD and LCx, CADRADS = 4. Her coronary calcium score was 579.  FFR CT showed significant distal left circumflex disease, but otherwise no clear severe stenoses. Atorvastatin was resumed, and lipids were better controlled on repeat.  She complained of infrequent fluttering and skipping palpitations lasting 5-10 minutes. Due to concern for possible  atrial fibrillation it was recommended she obtain a [employer]. Her blood pressure was poorly controlled at home and in clinic. Metoprolol was switched to carvedilol 25 mg BID. Also she complained of chest pain while lying down that seemed consistent with GERD, so she was given 40 mg pantoprazole daily. She continued to have symptoms concerning for ischemia despite a regimen of aspirin, amlodipine, and beta-blocker. Therefore, she underwent left heart catheterization 07/21/2021 which revealed 25% stenosis of the mid Cx and the mid LAD, 50% stenosis of the 2nd diagonal, and LVEF 55-65%. There was 75% stenosis of the mid to distal Cx and a DES was successfully placed. Started on aspirin/plavix for at least 6 months. Her blood pressure remained above goal so hydralazine 25 mg BID was added. She saw Gillian Shields 11/11/2021 and noted LE edema of her ankles. She was no longer taking HCTZ; unclear why it was stopped. HCTZ was resumed at 25 mg daily. Subsequently, based on labs 11/2021 HCTZ was transitioned to Lasix 20 mg daily. She continued to have swelling by her follow-up 12/26/2021 with slight improvement since starting Lasix. Due to palpitations, amlodipine was switched to diltiazem 120 mg daily. Discussed she could discontinue Clopidogrel 01/21/22. She had an echo 01/05/2022 showing LVEF 60-65%, moderate LVH, and indeterminate diastolic parameters.  At her visit 05/2022, her blood pressure was 182/84. Had not taken her antihypertensives but still noted similarly elevated blood pressures at home. Diltiazem was stopped, and amlodipine 5 mg daily was resumed. We also started spironolactone 25 mg daily. Planned to stop clopidogrel 07/2022. Renin and aldosterone levels were normal.  BNP normal. Renal artery dopplers were ordered but not completed. She complained of nightly palpitations. A 7 day Zio monitor was completed and showed rare PACs and PVCs, up to 4 beats NSVT. An [place] sleep study was ordered but not  completed.  Lately, she has been stressed with caring for her husband who has cancer. She also had been having swelling in her feet since about 3 weeks ago. Her swelling started to improve about 1.5 weeks ago and is largely improved today. She denies significant salt intake, no orthopnea. In the office today her blood pressure is initially 143/82, and 148/74 on manual recheck. She hasn't yet taken her antihypertensives today. Usually she takes all of her medications at the same time every day, usually 9:30 AM. Since last week she has noticed that her home blood pressures have been elevated; she has seen readings including 157/87, and 140/86 last week. Earlier this week on Monday her blood pressure was 150/88. Recently presented to a dentist for fracturing of her teeth. She was told she needed a crown and was given antibiotics which had improved the pain. However, her pain is returning and she believes there is an abscess which is causing significant pain and affecting her blood pressures. For exercise she is walking routinely at her first job. 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 She denies any palpitations, chest pain, shortness of breath, lightheadedness, headaches, syncope, orthopnea, or PND.   Past Medical History:  Diagnosis Date   CAD in native artery 07/15/2021   DVT (deep venous thrombosis) (HCC)    remote past, over 20 years ago, left leg   Essential hypertension, benign    GERD (gastroesophageal reflux disease) 07/15/2021   History of COVID-19 08/2019   History of kidney stones    IBS (irritable bowel syndrome)    Left arm pain 05/01/2022   Premature ventricular contractions (PVCs) (VPCs)    Per Dr. Loleta Chance   Pure hypercholesterolemia 05/23/2021   Resistant hypertension 11/16/2010   Type 2 diabetes mellitus (HCC)     Past Surgical History:  Procedure Laterality Date   COLONOSCOPY N/A 07/26/2015   Procedure: COLONOSCOPY;  Surgeon: West Bali, MD;   Location: [facility];  Service: Endoscopy;  Laterality: N/A;  130 - moved to 6/26 @1 :30 - office to notify pt   CORONARY STENT INTERVENTION N/A 07/21/2021   Procedure: CORONARY STENT INTERVENTION;  Surgeon: Corky Crafts, MD;  Location: [facility] INVASIVE CV LAB;  Service: Cardiovascular;  Laterality: N/A;   CYSTOSCOPY WITH RETROGRADE PYELOGRAM, URETEROSCOPY AND STENT PLACEMENT Right 12/24/2019   Procedure: CYSTOSCOPY WITH RETROGRADE PYELOGRAM, URETEROSCOPY AND STENT PLACEMENT;  Surgeon: Heloise Purpura, MD;  Location: [facility] ORS;  Service: Urology;  Laterality: Right;   CYSTOSCOPY/URETEROSCOPY/HOLMIUM LASER/STENT PLACEMENT Right 01/27/2020   Procedure: CYSTOSCOPY/URETEROSCOPY/HOLMIUM LASER/STENT PLACEMENT;  Surgeon: Noel Christmas, MD;  Location: [facility];  Service: Urology;  Laterality: Right;   ESOPHAGOGASTRODUODENOSCOPY N/A 07/26/2015   Procedure: ESOPHAGOGASTRODUODENOSCOPY (EGD);  Surgeon: West Bali, MD;  Location: [facility];  Service: Endoscopy;  Laterality: N/A;   LEFT HEART CATH AND CORONARY ANGIOGRAPHY N/A 07/21/2021   Procedure: LEFT HEART CATH AND CORONARY ANGIOGRAPHY;  Surgeon: Corky Crafts, MD;  Location: [facility] INVASIVE CV LAB;  Service: Cardiovascular;  Laterality: N/A;    Current Medications: Current Meds  Medication Sig   amLODipine (NORVASC) 5 MG tablet Take 1 tablet (5 mg total) by mouth daily.   amoxicillin (AMOXIL) 500 MG tablet TAKE 2 TABLETS FIRST  DOSE AND THEN 1 TABLET THREE TIMES A DAY FOR 3 DAYS ONLY   aspirin EC 81 MG tablet Take 81 mg by mouth in the morning. Swallow whole.   atorvastatin (LIPITOR) 40 MG tablet TAKE 1 TABLET BY MOUTH EVERY EVENING   carvedilol (COREG) 25 MG tablet Take 1 tablet by mouth twice daily   clopidogrel (PLAVIX) 75 MG tablet Take 1 tablet by mouth once daily   ibuprofen (ADVIL) 800 MG tablet Take 800 mg by mouth. Every 8-12 hours as needed   Insulin Glargine (BASAGLAR KWIKPEN [place]) Inject into the skin. Sliding Scale: 150 and  above=35 units/ 120-149=30 units/ 95-119=25 units/ 80-94=20 units/ less than 80=None   nitroGLYCERIN (NITROSTAT) 0.4 MG SL tablet Place 1 tablet (0.4 mg total) under the tongue every 5 (five) minutes as needed.   pantoprazole (PROTONIX) 40 MG tablet Take 1 tablet by mouth once daily   sitaGLIPtin-metformin (JANUMET) 50-1000 MG tablet Take 1 tablet by mouth 2 (two) times daily with a meal.   spironolactone (ALDACTONE) 25 MG tablet Take 1 tablet (25 mg total) by mouth daily.   telmisartan (MICARDIS) 80 MG tablet Take 1 tablet by mouth once daily   [DISCONTINUED] furosemide (LASIX) 20 MG tablet Take 1 tablet (20 mg total) by mouth daily.   [DISCONTINUED] hydrALAZINE (APRESOLINE) 25 MG tablet Take 1 tablet (25 mg total) by mouth in the morning and at bedtime.   [DISCONTINUED] LINZESS 145 MCG CAPS capsule Take 145 mcg by mouth daily.     Allergies:   Patient has no known allergies.   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   Occupation: [employer]     Comment: part time   Occupation:    Tobacco Use   Smoking status: Never   Smokeless tobacco: Never  Vaping Use   Vaping status: Never Used  Substance and Sexual Activity   Alcohol use: No    Alcohol/week: 0.0 standard drinks of alcohol   Drug use: No   Sexual activity: Not on file  Other Topics Concern   Not on file  Social History Narrative   Not on file   Social Determinants of Health   Financial Resource Strain: Low Risk  (05/23/2021)   Overall Financial Resource Strain (CARDIA)    Difficulty of Paying Living Expenses: Not hard at all  Food Insecurity: No Food Insecurity (05/23/2021)   Hunger Vital Sign    Worried About Running Out of Food in the Last Year:  Never true    Ran Out of Food in the Last Year: Never true  Transportation Needs: No Transportation Needs (05/23/2021)   PRAPARE - Administrator, Civil Service (Medical): No    Lack of Transportation (Non-Medical): No  Physical Activity: Inactive (05/23/2021)   Exercise Vital Sign    Days of Exercise per Week: 0 days    Minutes of Exercise per Session: 0 min  Stress: Not on file  Social Connections: Not on file     Family History: The patient's family history includes Arrhythmia in her sister; Coronary artery disease in her father and mother; Heart attack (age of onset: 71) in her sister; Heart disease in  her brother and father; Hypertension in her brother. There is no history of Colon cancer.  ROS:   Please see the history of present illness.    (+) Stress (+) Recent LE edema, has been improving (+) Dental pain All other systems reviewed and negative.   EKGs/Labs/Other Studies Reviewed:    The following studies were reviewed today:  7 Day Zio Monitor  05/2022: Quality: Fair.  Baseline artifact. Predominant rhythm: sinus rhythm Average heart rate: 75 bpm Max heart rate: 104 bpm Min heart rate: 75 bpm Pauses >2.5 seconds: none   Rare PACs and PVCs (<1%) Up to 4 beats NSVT  Echo  01/05/2022: 1. Left ventricular ejection fraction, by estimation, is 60 to 65%. The  left ventricle has normal function. The left ventricle has no regional  wall motion abnormalities. There is moderate left ventricular hypertrophy.  Left ventricular diastolic  parameters are indeterminate.   2. Right ventricular systolic function is normal. The right ventricular  size is normal.   3. The mitral valve is normal in structure. No evidence of mitral valve  regurgitation. No evidence of mitral stenosis.   4. The aortic valve is tricuspid. Aortic valve regurgitation is not  visualized. No aortic stenosis is present.   5. The inferior vena cava is normal in size with greater than 50%   respiratory variability, suggesting right atrial pressure of 3 mmHg.   Left Heart Catheterization 07/21/2021:   Mid Cx lesion is 25% stenosed.   2nd Diag lesion is 50% stenosed.   Mid LAD lesion is 25% stenosed.   Mid Cx to Dist Cx lesion is 75% stenosed.  A drug-eluting stent was successfully placed using a SYNERGY XD 2.25X20, post dilated to 2.5 mm.   Post intervention, there is a 0% residual stenosis.   The left ventricular systolic function is normal.   LV end diastolic pressure is mildly elevated.   The left ventricular ejection fraction is 55-65% by visual estimate.   There is no aortic valve stenosis.   Severe right subclavian tortuosity.  If emergent cath was needed, would not use right radial approach in the future.  May need to consider using destination sheath to help straighten the tortuosity.   Successful PCI of the distal circumflex.  Continue dual antiplatelet therapy for at least 6 months.  Consider clopidogrel monotherapy after that time.  Continue aggressive secondary prevention and risk factor management.  Diagnostic Dominance: Right  Intervention    CT FFR Analysis  06/09/2021: FINDINGS: FFRct analysis was performed on the original cardiac CT angiogram dataset. Diagrammatic representation of the FFRct analysis is provided in a separate PDF document in PACS. This dictation was created using the PDF document and an interactive 3D model of the results. 3D model is not available in the EMR/PACS. Normal FFR range is >0.80. Indeterminate (grey) zone is 0.76-0.80.   1. Left Main: FFR = 0.97   2. LAD: Proximal FFR = 0.91, mid FFR = 0.83, Distal FFR = 0.73, D1 FFR = 0.84 3. LCX: Proximal FFR = 0.93, distal FFR = 0.69, OM1 FFR = 0.93 4. RCA: Proximal FFR = 0.95, mid FFR =0.88, distal FFR = 0.80   IMPRESSION: 1. CT FFR analysis showed significant stenosis in the distal LCx lesion, 0.69. The vessel measures approximately 2 mm just proximal to this lesion.   2.  Mid-distal LAD lesions does not demonstrate hemodynamically significant stenosis nor significant FFR delta. The distal most LAD has reduced FFR but that may be secondary to vessel  tapering and diffuse disease. Vessel is <2 mm distally.  Coronary CT  06/07/2021: FINDINGS: Image quality: Good   Noise artifact is: Significantly reduced signal to noise ratio, affects accuracy of detecting small plaques.   Coronary calcium score is 579, which places the patient in the 99th percentile for age and sex matched control.   Coronary arteries: Normal coronary origins.  Right dominance.   Right Coronary Artery: Small caliber yet dominant RCA, with mild atherosclerotic plaque in the mid and distal RCA, 25-49% stenosis.   Left Main Coronary Artery: No detectable plaque or stenosis.   Left Anterior Descending Coronary Artery: Moderate mixed atherosclerotic plaque in the proximal LAD, mid LAD and proximal first diagonal, 50-69% stenosis. Possible severe stenosis in the mid to distal LAD, 70-99% stenosis.   Left Circumflex Artery: Moderate stenosis in the proximal LCx and possible moderate stenosis in OM1, 50-69% stenosis. Severe mixed atherosclerotic plaque in the mid-distal LCx, 70-99% stenosis.   Aorta: Normal size, 32 mm at the mid ascending aorta (level of the PA bifurcation) measured double oblique. No calcifications. No dissection.   Aortic Valve: No calcifications.   Other findings:   Normal pulmonary vein drainage into the left atrium.   Normal left atrial appendage without thrombus.   Mild dilation of main pulmonary artery, 30 mm, may indicate pulmonary hypertension.   IMPRESSION: 1. Significantly reduced signal to noise ratio limits the diagnostic quality of this scan.   2. Severe CAD in the mid-distal LAD and LCx, CADRADS = 4. CT FFR will be performed and reported separately.   3. Coronary calcium score is 579, which places the patient in the 99th percentile for age and  sex matched control.   4. Normal coronary origins with right dominance.   5. Mild dilation of main pulmonary artery, 30 mm, may indicate pulmonary hypertension.  Echo 03/23/17 - Left ventricle: The cavity size was normal. Wall thickness was    increased in a pattern of mild LVH. Systolic function was normal.    The estimated ejection fraction was in the range of 60% to 65%.    Wall motion was normal; there were no regional wall motion    abnormalities. Left ventricular diastolic function parameters    were normal for the patient&'s age.  - Mitral valve: There was trivial regurgitation.  - Left atrium: The atrium was at the upper limits of normal in    size.  - Right atrium: Central venous pressure (est): 3 mm Hg.  - Tricuspid valve: There was trivial regurgitation.  - Pulmonary arteries: Systolic pressure could not be accurately    estimated.  - Pericardium, extracardiac: There was no pericardial effusion.   Monitor 02/28/17 Sinus rhythm with frequent PVC's and occasional PAC's. No diary was returned.  EKG:  EKG is personally reviewed. 08/17/2022:  Not ordered. 05/01/2022:  EKG was not ordered. 07/15/2021:  EKG was not ordered. 05/23/21: Sinus rhythm rate 73 bpm; cannot rule out prior inferior infarct and poor R-wave progression  Recent Labs: 11/01/2021: ALT 31 12/26/2021: BUN 12; Creatinine, Ser 0.65; Hemoglobin 11.8; Magnesium 2.0; Platelets 270; Potassium 4.1; Sodium 138; TSH 1.340 05/01/2022: BNP 27.4   Recent Lipid Panel    Component Value Date/Time   CHOL 123 07/13/2021 0903   TRIG 98 07/13/2021 0903   HDL 47 07/13/2021 0903   CHOLHDL 2.6 07/13/2021 0903   LDLCALC 58 07/13/2021 0903     Physical Exam:    VS:  BP (!) 148/74 (BP Location: Left Arm, Patient Position: Sitting,  Cuff Size: Large)   Pulse 65   Ht 5\' 9"  (1.753 m)   Wt 282 lb (127.9 kg)   LMP 07/24/2015 Comment: currently experiencing cycle at this time  SpO2 97%   BMI 41.64 kg/m  , BMI Body mass index is  41.64 kg/m. GENERAL:  Well appearing HEENT: Pupils equal round and reactive, fundi not visualized, oral mucosa unremarkable NECK:  No jugular venous distention, waveform within normal limits, carotid upstroke brisk and symmetric, no bruits, no thyromegaly LUNGS:  Clear to auscultation bilaterally HEART:  RRR.  PMI not displaced or sustained,S1 and S2 within normal limits, no S3, no S4, no clicks, no rubs, no murmurs ABD:  Flat, positive bowel sounds normal in frequency in pitch, no bruits, no rebound, no guarding, no midline pulsatile mass, no hepatomegaly, no splenomegaly EXT:  2 plus pulses throughout, no LE edema, no cyanosis no clubbing SKIN:  No rashes no nodules NEURO:  Cranial nerves II through XII grossly intact, motor grossly intact throughout PSYCH:  Cognitively intact, oriented to person place and time  ASSESSMENT:    1. Essential hypertension   2. Snoring   3. Daytime somnolence   4. BMI 40.0-44.9, adult (HCC)   5. Lower extremity edema     PLAN:        # Hypertension: Persistent elevated blood pressure readings at home (up to 150/88). Currently on Amlodipine, Carvedilol, and Hydralazine. No reported issues with medication adherence. -Increase Hydralazine from 25mg  to 50mg  twice daily. -Continue monitoring blood pressure at home. -Continue to encourage exercise -Schedule renal artery Doppler to rule out renal artery stenosis.  # Peripheral Edema: Reported swelling in feet lasting about a week and a half, resolved now. No associated shortness of breath or other symptoms. -Continue current dose of Furosemide (Lasix) 20mg  daily. -If swelling recurs, patient may increase Furosemide to 40mg  as needed.  # Dental Abscess: Reported dental pain and possible abscess following a dental injury. Unable to see a dentist until August 20th. -Prescribe Amoxicillin for dental abscess.  1g x1 then 500mg  tid x3 dyas. -Advise patient to continue seeking earlier dental appointment.  #  Sleep Apnea: Previous attempt at home sleep study unsuccessful due to poor internet service. -Schedule in-lab sleep study.  Follow-up in a couple of months or sooner if needed.      Disposition: FU with Paxon Propes C. Duke Salvia, MD, [facility] in 2 months.  Medication Adjustments/Labs and Tests Ordered: Current medicines are reviewed at length with the patient today.  Concerns regarding medicines are outlined above.   Orders Placed This Encounter  Procedures   Split night study   Meds ordered this encounter  Medications   hydrALAZINE (APRESOLINE) 50 MG tablet    Sig: Take 1 tablet (50 mg total) by mouth in the morning and at bedtime.    Dispense:  180 tablet    Refill:  3    NEW DOSE, D/C 25 MG RX   amoxicillin (AMOXIL) 500 MG tablet    Sig: TAKE 2 TABLETS FIRST DOSE AND THEN 1 TABLET THREE TIMES A DAY FOR 3 DAYS ONLY    Dispense:  10 tablet    Refill:  0   furosemide (LASIX) 20 MG tablet    Sig: Take 1 tablet (20 mg total) by mouth daily. OK TO TAKE AN EXTRA TABLET AS NEEDED FOR SWELLING/SHORTNESS OF BREATH    Dispense:  115 tablet    Refill:  3    NEW DOSE, PATIENT WILL CALL WHEN NEEDS FILLED  I,Mathew Stumpf,acting as a Neurosurgeon for Chilton Si, MD.,have documented all relevant documentation on the behalf of Chilton Si, MD,as directed by  Chilton Si, MD while in the presence of Chilton Si, MD.  I, Milany Geck C. Duke Salvia, MD have reviewed all documentation for this visit.  The documentation of the exam, diagnosis, procedures, and orders on 08/17/2022 are all accurate and complete.  Signed, Chilton Si, MD  08/17/2022 10:25 AM    [facility]

## 2022-08-17 NOTE — Telephone Encounter (Signed)
Rx(s) sent to pharmacy electronically.  

## 2022-08-17 NOTE — Patient Instructions (Addendum)
Medication Instructions:  INCREASE YOUR HYDRALAZINE TO 50 MG EVERY MORNING AND EVENING   OK TO USE FUROSEMIDE AS NEEDED FOR SHORTNESS OF BREATH OR SWELLING   START AMOXIL 500 MG 2 TABLETS FIRST DOSE AND THEN 1 TABLET 3 TIMES A DAY FOR 3 DAYS  Labwork: NONE  Testing/Procedures: Your physician has requested that you have a renal artery duplex. During this test, an ultrasound is used to evaluate blood flow to the kidneys. Allow one hour for this exam. Do not eat after midnight the day before and avoid carbonated beverages. Take your medications as you usually do.  Your physician has recommended that you have a sleep study. This test records several body functions during sleep, including: brain activity, eye movement, oxygen and carbon dioxide blood levels, heart rate and rhythm, breathing rate and rhythm, the flow of air through your mouth and nose, snoring, body muscle movements, and chest and belly movement.  Follow-Up: 2 MONTHS WITH CAITLIN W NP IN HYPERTENSION CLINIC

## 2022-08-18 ENCOUNTER — Telehealth (HOSPITAL_BASED_OUTPATIENT_CLINIC_OR_DEPARTMENT_OTHER): Payer: Self-pay

## 2022-08-18 NOTE — Telephone Encounter (Signed)
Erroneous encounter

## 2022-08-19 ENCOUNTER — Other Ambulatory Visit (HOSPITAL_BASED_OUTPATIENT_CLINIC_OR_DEPARTMENT_OTHER): Payer: Self-pay | Admitting: Family

## 2022-08-22 ENCOUNTER — Encounter (HOSPITAL_BASED_OUTPATIENT_CLINIC_OR_DEPARTMENT_OTHER): Payer: BC Managed Care – PPO

## 2022-08-22 ENCOUNTER — Telehealth (HOSPITAL_BASED_OUTPATIENT_CLINIC_OR_DEPARTMENT_OTHER): Payer: Self-pay | Admitting: Cardiovascular Disease

## 2022-08-22 NOTE — Telephone Encounter (Signed)
Called to speak with patient to reschedule the Renal Duplex appointment that she missed on 08/22/22.  Patient states she will call back to reschedule

## 2022-08-28 ENCOUNTER — Other Ambulatory Visit (HOSPITAL_BASED_OUTPATIENT_CLINIC_OR_DEPARTMENT_OTHER): Payer: Self-pay | Admitting: Cardiovascular Disease

## 2022-08-30 ENCOUNTER — Telehealth (HOSPITAL_BASED_OUTPATIENT_CLINIC_OR_DEPARTMENT_OTHER): Payer: Self-pay | Admitting: Cardiovascular Disease

## 2022-08-30 NOTE — Telephone Encounter (Signed)
Spoke with patient to reschedule the 08/22/22 missed renal doppler appointment.  Patient states she will call back at 12:30 pm today to reschedule

## 2022-09-10 ENCOUNTER — Other Ambulatory Visit (HOSPITAL_BASED_OUTPATIENT_CLINIC_OR_DEPARTMENT_OTHER): Payer: Self-pay | Admitting: Cardiovascular Disease

## 2022-09-21 ENCOUNTER — Encounter (HOSPITAL_BASED_OUTPATIENT_CLINIC_OR_DEPARTMENT_OTHER): Payer: BC Managed Care – PPO

## 2022-10-12 ENCOUNTER — Ambulatory Visit (INDEPENDENT_AMBULATORY_CARE_PROVIDER_SITE_OTHER): Payer: BC Managed Care – PPO

## 2022-10-12 ENCOUNTER — Telehealth (HOSPITAL_BASED_OUTPATIENT_CLINIC_OR_DEPARTMENT_OTHER): Payer: Self-pay | Admitting: *Deleted

## 2022-10-12 DIAGNOSIS — I2510 Atherosclerotic heart disease of native coronary artery without angina pectoris: Secondary | ICD-10-CM | POA: Diagnosis not present

## 2022-10-12 DIAGNOSIS — I10 Essential (primary) hypertension: Secondary | ICD-10-CM | POA: Diagnosis not present

## 2022-10-12 DIAGNOSIS — I493 Ventricular premature depolarization: Secondary | ICD-10-CM | POA: Diagnosis not present

## 2022-10-12 NOTE — Telephone Encounter (Signed)
Patient was seen in July and sleep study ordered She has not heard from scheduling team to arrange Will forward to sleep pool for review

## 2022-10-17 NOTE — Telephone Encounter (Signed)
No PA required for Split-night sleep study. [facility] sleep lab will call her to schedule.

## 2022-10-19 ENCOUNTER — Ambulatory Visit (HOSPITAL_BASED_OUTPATIENT_CLINIC_OR_DEPARTMENT_OTHER): Payer: BC Managed Care – PPO | Admitting: Family

## 2022-11-03 ENCOUNTER — Other Ambulatory Visit (HOSPITAL_BASED_OUTPATIENT_CLINIC_OR_DEPARTMENT_OTHER): Payer: Self-pay

## 2022-11-03 DIAGNOSIS — I701 Atherosclerosis of renal artery: Secondary | ICD-10-CM

## 2022-11-10 ENCOUNTER — Encounter (HOSPITAL_BASED_OUTPATIENT_CLINIC_OR_DEPARTMENT_OTHER): Payer: BC Managed Care – PPO | Admitting: Cardiology

## 2022-11-10 ENCOUNTER — Other Ambulatory Visit (HOSPITAL_COMMUNITY): Payer: Self-pay | Admitting: Family Medicine

## 2022-11-10 DIAGNOSIS — Z1231 Encounter for screening mammogram for malignant neoplasm of breast: Secondary | ICD-10-CM

## 2022-11-13 ENCOUNTER — Inpatient Hospital Stay (HOSPITAL_COMMUNITY): Admission: RE | Admit: 2022-11-13 | Payer: BC Managed Care – PPO | Source: Ambulatory Visit

## 2022-11-13 ENCOUNTER — Encounter (HOSPITAL_COMMUNITY): Payer: Self-pay

## 2022-11-13 DIAGNOSIS — Z1231 Encounter for screening mammogram for malignant neoplasm of breast: Secondary | ICD-10-CM

## 2022-11-17 ENCOUNTER — Encounter (HOSPITAL_BASED_OUTPATIENT_CLINIC_OR_DEPARTMENT_OTHER): Payer: BC Managed Care – PPO | Admitting: Cardiovascular Disease

## 2023-02-09 ENCOUNTER — Telehealth (HOSPITAL_BASED_OUTPATIENT_CLINIC_OR_DEPARTMENT_OTHER): Payer: Self-pay | Admitting: Cardiovascular Disease

## 2023-02-09 NOTE — Telephone Encounter (Signed)
 The only available is 2/6 at 2:45 PM with me. If she wants that appt slot we can make a one-time exception due to extenuating circumstances but future follow up will need to be in general cardiology clinic slot.  Alver Sorrow, NP

## 2023-02-09 NOTE — Telephone Encounter (Signed)
 Pt needs to r/s her appt frm last fall and was hoping to be seen on either 1/23 or 2/6 when her husband is in the [facility] for his chemo. She isn't a HTN pt and those two days are [facility]'s HTN clinic days. Can we work her in to either day for her f/u?

## 2023-02-12 NOTE — Telephone Encounter (Signed)
 Left VM for patient offering 2/6 at 2:45 and emphasized that this was a one-off exception to allow her to book a regular office visit on a HTN clinic day.

## 2023-03-05 NOTE — Telephone Encounter (Signed)
Pt finally called back but the appt slot on 2/6 at 2:45 was booked by another patient. Is there another Thursday that would work? Pt's husband has chemo at [facility] on Thursdays from 8:45- approx 1pm. As an alternative, would a video visit be appropriate for her?

## 2023-03-07 ENCOUNTER — Ambulatory Visit (HOSPITAL_COMMUNITY)
Admission: RE | Admit: 2023-03-07 | Discharge: 2023-03-07 | Disposition: A | Discharge status: Home / Self Care | Payer: 59 | Source: Ambulatory Visit | Attending: Family Medicine | Admitting: Family Medicine

## 2023-03-07 ENCOUNTER — Encounter (HOSPITAL_COMMUNITY): Payer: Self-pay

## 2023-03-07 DIAGNOSIS — Z1231 Encounter for screening mammogram for malignant neoplasm of breast: Secondary | ICD-10-CM | POA: Insufficient documentation

## 2023-03-15 ENCOUNTER — Encounter (HOSPITAL_BASED_OUTPATIENT_CLINIC_OR_DEPARTMENT_OTHER): Payer: Self-pay | Admitting: Cardiovascular Disease

## 2023-03-15 NOTE — Telephone Encounter (Signed)
Would just recommend we mail her a letter reminding her to call and schedule appointment or stop by the desk one day while her husband is receiving treatment.  Alver Sorrow, NP

## 2023-05-08 ENCOUNTER — Other Ambulatory Visit (HOSPITAL_BASED_OUTPATIENT_CLINIC_OR_DEPARTMENT_OTHER): Payer: Self-pay | Admitting: Cardiovascular Disease

## 2023-05-11 ENCOUNTER — Ambulatory Visit (HOSPITAL_BASED_OUTPATIENT_CLINIC_OR_DEPARTMENT_OTHER): Payer: 59 | Admitting: Family

## 2023-05-11 ENCOUNTER — Encounter (HOSPITAL_BASED_OUTPATIENT_CLINIC_OR_DEPARTMENT_OTHER): Payer: Self-pay | Admitting: Family

## 2023-05-11 ENCOUNTER — Encounter (HOSPITAL_BASED_OUTPATIENT_CLINIC_OR_DEPARTMENT_OTHER): Payer: Self-pay | Admitting: *Deleted

## 2023-05-11 VITALS — BP 142/88 | HR 73 | Ht 69.0 in | Wt 281.0 lb

## 2023-05-11 DIAGNOSIS — R0683 Snoring: Secondary | ICD-10-CM

## 2023-05-11 DIAGNOSIS — I10 Essential (primary) hypertension: Secondary | ICD-10-CM

## 2023-05-11 DIAGNOSIS — R400 Somnolence: Secondary | ICD-10-CM

## 2023-05-11 MED ORDER — AMLODIPINE BESYLATE 5 MG PO TABS: 5.0000 mg | ORAL_TABLET | Freq: Every day | ORAL | 3 refills | Status: AC

## 2023-05-11 MED ORDER — HYDRALAZINE HCL 25 MG PO TABS: 25.0000 mg | ORAL_TABLET | Freq: Two times a day (BID) | ORAL | 1 refills | Status: DC

## 2023-05-11 NOTE — Progress Notes (Signed)
 [facility] Initial Assessment:    Date:  05/12/2023   ID:  Melissa, Casey , MRN [id number]  PCP:  Wilburn Handler, MD  Cardiologist:  Maudine Sos, MD  Nephrologist:  Referring MD: Wilburn Handler, MD   CC: Hypertension  History of Present Illness:    Melissa Casey is a 58 y.o. female with a hx of hypertension, hyperlipidemia, DM2, obesity here to follow up in the [facility].   Prior monitor 03/2017 frequent PVC in setting of hypokalemia which were improved with Metoprolol. Echo 03/2017 EF 60-65% with normal diastolic function. Had COVID-19 01/2021. At follow up 04/2021 noted chest pain with following cardiac CTA with calcium score 579 placing her in 99th percentile. Noted significant stenosis to Cx by FFR. Underwent LHC 07/21/21 with PCI of distal circumflex, mid Cx 25%, 2nd diag 50%, mid LAD 25%. Her EF 55-65%. Recommended for DAPT ASA/Plavix for 6 mos then consider Plavix monotherapy. At follow up 07/28/21 she was doing well from cardiac perspective and encouraged to participate in cardiac rehab. Hydralazine added due to elevated BP.   Seen 09/23/21 doing well from cardiac perspective and no changes made.  A follow-up 11/08/2021 noted lower extremity edema and hydrochlorothiazide had been discontinued unclear date.  It was resumed at 25 mg daily.  Swelling presumed due to venous insufficiency.  Via subsequent MyChart encounter and based on labs her hydrochlorothiazide was stopped and transition to Lasix 20 mg daily.  Last seen by Dr. Theodis Fiscal 08/17/2022.  Hydralazine increased from 25 to 50 mg twice daily.  Amlodipine, carvedilol were continued. Recommended continue furosemide 20 mg daily and use increased dose 40 mg as needed for swelling.  She was giving amoxicillin for a dental abscess and encouraged to seek earlier dental appointment.  She was also recommended for in lab sleep study as prior home sleep study was unsuccessful due to poor  Internet service.  Renal Doppler 10/2022 with left renal artery 1-59% stenosis recommended for repeat duplex in 1 year for monitoring.  She has canceled 2 in lab sleep study since that time.  Melissa Casey was diagnosed with hypertension more than 10 years ago. It has been difficult to control.  she reports tobacco use never. For exercise she has no formal routine and recently has been focusing on her husband who is undergoing cancer treatments. She is no longer taking Hydralazine, she was under the impression it was discontinued. Reports headache during the day around the back of her head and then spreads up her head. It does resolve with ibuprofen. She notes it may be due to another dental infection which she is awaiting appointment for. BP at home this morning 148/84 which was before her medications. She is worried about "carrying fluid" due to her weight. No edema, orthopnea.   Present medication regimen:  AM : Carvedilol, Spironolactone, Lasix, Telmisartan PM : Carvedilol  Previous antihypertensives:   Past Medical History:  Diagnosis Date   CAD in native artery 07/15/2021   DVT (deep venous thrombosis) (HCC)    remote past, over 20 years ago, left leg   Essential hypertension, benign    GERD (gastroesophageal reflux disease) 07/15/2021   History of COVID-19 08/2019   History of kidney stones    IBS (irritable bowel syndrome)    Left arm pain 05/01/2022   Premature ventricular contractions (PVCs) (VPCs)    Per Dr. Genita Keys   Pure hypercholesterolemia 05/23/2021   Resistant hypertension 11/16/2010   Type 2 diabetes mellitus (  [facility])     Past Surgical History:  Procedure Laterality Date   BREAST BIOPSY Left 03/2015   fibrocystic changes   COLONOSCOPY N/A 07/26/2015   Procedure: COLONOSCOPY;  Surgeon: Alyce Jubilee, MD;  Location[facility];  Service: Endoscopy;  Laterality: N/A;  130 - moved to 6/26 @1 :30 - office to notify pt   CORONARY STENT INTERVENTION N/A 07/21/2021    Procedure: CORONARY STENT INTERVENTION;  Surgeon: Lucendia Rusk, MD;  Location: [facility] INVASIVE CV LAB;  Service: Cardiovascular;  Laterality: N/A;   CYSTOSCOPY WITH RETROGRADE PYELOGRAM, URETEROSCOPY AND STENT PLACEMENT Right 12/24/2019   Procedure: CYSTOSCOPY WITH RETROGRADE PYELOGRAM, URETEROSCOPY AND STENT PLACEMENT;  Surgeon: Florencio Hunting, MD;  Location: [facility] ORS;  Service: Urology;  Laterality: Right;   CYSTOSCOPY/URETEROSCOPY/HOLMIUM LASER/STENT PLACEMENT Right 01/27/2020   Procedure: CYSTOSCOPY/URETEROSCOPY/HOLMIUM LASER/STENT PLACEMENT;  Surgeon: Roxane Copp, MD;  Location: [facility] ORS;  Service: Urology;  Laterality: Right;   ESOPHAGOGASTRODUODENOSCOPY N/A 07/26/2015   Procedure: ESOPHAGOGASTRODUODENOSCOPY (EGD);  Surgeon: Alyce Jubilee, MD;  Location: [facility];  Service: Endoscopy;  Laterality: N/A;   LEFT HEART CATH AND CORONARY ANGIOGRAPHY N/A 07/21/2021   Procedure: LEFT HEART CATH AND CORONARY ANGIOGRAPHY;  Surgeon: Lucendia Rusk, MD;  Location: [facility] INVASIVE CV LAB;  Service: Cardiovascular;  Laterality: N/A;    Current Medications: Current Meds  Medication Sig   aspirin EC 81 MG tablet Take 81 mg by mouth in the morning. Swallow whole.   atorvastatin (LIPITOR) 40 MG tablet TAKE 1 TABLET BY MOUTH EVERY EVENING   carvedilol (COREG) 25 MG tablet Take 1 tablet (25 mg total) by mouth 2 (two) times daily with a meal.   clopidogrel (PLAVIX) 75 MG tablet Take 1 tablet by mouth once daily   furosemide (LASIX) 20 MG tablet Take 1 tablet (20 mg total) by mouth daily. OK TO TAKE AN EXTRA TABLET AS NEEDED FOR SWELLING/SHORTNESS OF BREATH   ibuprofen (ADVIL) 800 MG tablet Take 800 mg by mouth. Every 8-12 hours as needed   Insulin Glargine (BASAGLAR KWIKPEN Regan) Inject into the skin. Sliding Scale: 150 and above=35 units/ 120-149=30 units/ 95-119=25 units/ 80-94=20 units/ less than 80=None   LINZESS 145 MCG CAPS capsule Take 145 mcg by mouth as needed.   nitroGLYCERIN  (NITROSTAT) 0.4 MG SL tablet Place 1 tablet (0.4 mg total) under the tongue every 5 (five) minutes as needed.   pantoprazole (PROTONIX) 40 MG tablet Take 1 tablet by mouth once daily   sitaGLIPtin-metformin (JANUMET) 50-1000 MG tablet Take 1 tablet by mouth 2 (two) times daily with a meal.   spironolactone (ALDACTONE) 25 MG tablet Take 1 tablet by mouth once daily   telmisartan (MICARDIS) 80 MG tablet Take 1 tablet by mouth once daily     Allergies:   Patient has no known allergies.   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   Occupation: [employer]     Comment: part time   Occupation:    Tobacco Use   Smoking status: Never   Smokeless tobacco: Never  Vaping Use   Vaping status: Never Used  Substance and Sexual Activity   Alcohol use: No  Alcohol/week: 0.0 standard drinks of alcohol   Drug use: No   Sexual activity: Not on file  Other Topics Concern   Not on file  Social History Narrative   Not on file   Social Drivers of Health   Financial Resource Strain: Low Risk  (05/23/2021)   Overall Financial Resource Strain (CARDIA)    Difficulty of Paying Living Expenses: Not hard at all  Food Insecurity: No Food Insecurity (05/23/2021)   Hunger Vital Sign    Worried About Running Out of Food in the Last Year: Never true    Ran Out of Food in the Last Year: Never true  Transportation Needs: No Transportation Needs (05/23/2021)   PRAPARE - Administrator, Civil Service (Medical): No    Lack of Transportation (Non-Medical): No  Physical Activity: Inactive (05/23/2021)   Exercise Vital Sign    Days of Exercise per Week: 0 days    Minutes of Exercise per Session: 0 min  Stress: Not on file  Social  Connections: Not on file     Family History: The patient's family history includes Arrhythmia in her sister; Coronary artery disease in her father and mother; Heart attack (age of onset: 25) in her sister; Heart disease in her brother and father; Hypertension in her brother. There is no history of Colon cancer.  ROS:   Please see the history of present illness.    All other systems reviewed and are negative.  EKGs/Labs/Other Studies Reviewed:    EKG Interpretation Date/Time:  Friday May 11 2023 15:24:09 EDT Ventricular Rate:  72 PR Interval:  180 QRS Duration:  92 QT Interval:  398 QTC Calculation: 435 R Axis:   20  Text Interpretation: Normal sinus rhythm Poor R wave progression No acute ST/T wave changes Confirmed by Neomi Banks ([id number]) on 05/11/2023 3:31:56 PM    Recent Labs: No results found for requested labs within last 365 days.   Recent Lipid Panel    Component Value Date/Time   CHOL 123 07/13/2021 0903   TRIG 98 07/13/2021 0903   HDL 47 07/13/2021 0903   CHOLHDL 2.6 07/13/2021 0903   LDLCALC 58 07/13/2021 0903    Physical Exam:   VS:  BP (!) 142/88   Pulse 73   Ht 5\' 9"  (1.753 m)   Wt 281 lb (127.5 kg)   LMP 07/24/2015 Comment: currently experiencing cycle at this time  SpO2 98%   BMI 41.50 kg/m  , BMI Body mass index is 41.5 kg/m. GENERAL:  Well appearing, overweight.  HEENT: Pupils equal round and reactive, fundi not visualized, oral mucosa unremarkable NECK:  No jugular venous distention, waveform within normal limits, carotid upstroke brisk and symmetric, no bruits, no thyromegaly LYMPHATICS:  No cervical adenopathy LUNGS:  Clear to auscultation bilaterally HEART:  RRR.  PMI not displaced or sustained,S1 and S2 within normal limits, no S3, no S4, no clicks, no rubs, no murmurs ABD:  Flat, positive bowel sounds normal in frequency in pitch, no bruits, no rebound, no guarding, no midline pulsatile mass, no hepatomegaly, no splenomegaly EXT:  2 plus  pulses throughout, no edema, no cyanosis no clubbing SKIN:  No rashes no nodules NEURO:  Cranial nerves II through XII grossly intact, motor grossly intact throughout PSYCH:  Cognitively intact, oriented to person place and time   ASSESSMENT/PLAN:    HTN - BP not at goal. She misunderstood directions and has not been taking hydralazine. Resume Hydralazine 25mg  BID. If BP not controlled at follow  up, further increase. Continue coreg 25mg  BID, Lasix 20mg  daily, Spironolactone 25mg  daily, Telmisartan 80mg  daily, amlodipine 5mg  daily.  Discussed to monitor BP at home at least 2 hours after medications and sitting for 5-10 minutes.  Reorder sleep study, as below. Remainder of secondary workup unremarkable. If slee pstudy unremarkable, consider RDN when procedure resumes  Peripheral edema- Well controlled on Lasix 20mg  daily with additional 20mg  PRN. Continue same.   Sleep apnea-previous attempt at home sleep study unsuccessful due to poor Internet service. Reschedule in lab sleep study.   Screening for Secondary Hypertension:     05/01/2022    8:22 AM 05/11/2023    5:00 PM  Causes  Drugs/Herbals Screened      - Comments occasional iburprofen.  limits salt.  No EtOH.  Rare caffeine.   Renovascular HTN Screened      - Comments check renal Dopplers   Sleep Apnea Screened Screened     - Comments check sleep stuidy sleep study re-ordered  Thyroid Disease Screened   Hyperaldosteronism  Screened     - Comments  unremarkable renin-aldo 2023  Pheochromocytoma  Screened     - Comments  normal adrenals by CT 2021    Relevant Labs/Studies:    Latest Ref Rng & Units 12/26/2021    9:53 AM 12/16/2021    4:26 PM 11/01/2021    8:20 AM  Basic Labs  Sodium 134 - 144 mmol/L 138  138  140   Potassium 3.5 - 5.2 mmol/L 4.1  3.6  3.5   Creatinine 0.57 - 1.00 mg/dL 7.84  6.96  2.95        Latest Ref Rng & Units 12/26/2021    9:53 AM  Thyroid   TSH 0.450 - 4.500 uIU/mL 1.340        Latest Ref Rng &  Units 05/01/2022    8:55 AM  Renin/Aldosterone   Aldosterone 0.0 - 30.0 ng/dL 6.2   Aldos/Renin Ratio 0.0 - 30.0 16.8              11/03/2022    8:08 AM  Renovascular   Renal Artery US  Completed Yes     Disposition:    FU with MD/APP/PharmD in 2 months    Medication Adjustments/Labs and Tests Ordered: Current medicines are reviewed at length with the patient today.  Concerns regarding medicines are outlined above.  Orders Placed This Encounter  Procedures   EKG 12-Lead   Split night study   Meds ordered this encounter  Medications   hydrALAZINE (APRESOLINE) 25 MG tablet    Sig: Take 1 tablet (25 mg total) by mouth in the morning and at bedtime.    Dispense:  180 tablet    Refill:  1    NEW DOSE, D/C 25 MG RX    Supervising Provider:   Sheryle Donning [[id number]]   amLODipine (NORVASC) 5 MG tablet    Sig: Take 1 tablet (5 mg total) by mouth daily.    Dispense:  90 tablet    Refill:  3    Supervising Provider:   Sheryle Donning [[id number]]     Signed, Clearnce Curia, NP  05/12/2023 11:22 AM    [facility]

## 2023-05-11 NOTE — Patient Instructions (Addendum)
 Medication Instructions:  RESUME HYDRALAZINE 25 MG TWICE A DAY   Labwork: NONE  Testing/Procedures: Your physician has recommended that you have a sleep study. This test records several body functions during sleep, including: brain activity, eye movement, oxygen and carbon dioxide blood levels, heart rate and rhythm, breathing rate and rhythm, the flow of air through your mouth and nose, snoring, body muscle movements, and chest and belly movement. IF YOU DO NOT HEAR FROM THEM IN 2 WEEKS CALL OR MYCHART THE OFFICE TO FOLLOW UP   Follow-Up: 07/26/2023 11:20 AM WITH CAITLIN W NP   If you need a refill on your cardiac medications before your next appointment, please call your pharmacy.

## 2023-05-31 ENCOUNTER — Telehealth (HOSPITAL_BASED_OUTPATIENT_CLINIC_OR_DEPARTMENT_OTHER): Payer: Self-pay | Admitting: Family

## 2023-05-31 NOTE — Telephone Encounter (Signed)
 Can we just let her know sleep team is looking into it, please? TY!  Basil Blakesley S Laniece Hornbaker, NP

## 2023-05-31 NOTE — Telephone Encounter (Signed)
 Pt came by and asked about the sleep study, she has yet to receive a call about it. Would like to do it sooner than later.

## 2023-06-01 NOTE — Telephone Encounter (Signed)
**Note De-Identified Aaric Dolph Obfuscation** Per the [employer] [facility] website: [facility] [place] does not require a prior authorization for CPT Code: 95811-Split Night Sleep Study.  I have transferred the order to the sleep lab so they can contact the pt to schedule the test.  I called the pt but got no answer so I left a message on her VM (Ok per [facility]) advising her that Raina Bunting did not require a PA for her Split Night Sleep Study and that someone from the sleep lab will be contacting her to schedule the test. I did leave the Sleep Lab's phone number in the message and advised the pt to call to schedule her Split Night Sleep Study if she has not heard from the sleep lab within a week or two.

## 2023-07-12 ENCOUNTER — Ambulatory Visit (HOSPITAL_BASED_OUTPATIENT_CLINIC_OR_DEPARTMENT_OTHER): Admitting: Family

## 2023-07-12 ENCOUNTER — Encounter (HOSPITAL_BASED_OUTPATIENT_CLINIC_OR_DEPARTMENT_OTHER): Payer: Self-pay | Admitting: Family

## 2023-07-12 VITALS — BP 163/78 | HR 67 | Ht 68.0 in | Wt 275.8 lb

## 2023-07-12 DIAGNOSIS — I25118 Atherosclerotic heart disease of native coronary artery with other forms of angina pectoris: Secondary | ICD-10-CM

## 2023-07-12 DIAGNOSIS — I493 Ventricular premature depolarization: Secondary | ICD-10-CM

## 2023-07-12 DIAGNOSIS — I1A0 Resistant hypertension: Secondary | ICD-10-CM | POA: Diagnosis not present

## 2023-07-12 DIAGNOSIS — I2510 Atherosclerotic heart disease of native coronary artery without angina pectoris: Secondary | ICD-10-CM

## 2023-07-12 DIAGNOSIS — I209 Angina pectoris, unspecified: Secondary | ICD-10-CM

## 2023-07-12 MED ORDER — HYDRALAZINE HCL 50 MG PO TABS: 50.0000 mg | ORAL_TABLET | Freq: Two times a day (BID) | ORAL | 3 refills | Status: AC

## 2023-07-12 NOTE — Patient Instructions (Addendum)
 Medication Instructions:  CHANGE Hydralazine  to 50mg  twice per day You may use up your 25mg  tablets by taking two tablets twice per day    Testing/Procedures: Your EKG today looked great!   Follow-Up: Please follow up in 2-3 months in ADV HTN CLINIC with Dr. Theodis Fiscal, Neomi Banks, NP or Donivan Furry PharmD    Special Instructions:    Recommend calling to schedule your sleep study:  [facility] at [facility] [address] [place],  [place]  [address] Main: [phone]

## 2023-07-12 NOTE — Progress Notes (Signed)
 Advanced [facility]pertension [facility] Assessment:    Date:  07/12/2023   ID:  Melissa, Casey , MRN [id number]  PCP:  Wilburn Handler, MD  Cardiologist:  Maudine Sos, MD  Nephrologist:  Referring MD: Wilburn Handler, MD   CC: Hypertension  History of Present Illness:    Melissa Casey is a 58 y.o. female with a hx of hypertension, hyperlipidemia, DM2, obesity here to follow up in the [facility].   Prior monitor 03/2017 frequent PVC in setting of hypokalemia which were improved with Metoprolol . Echo 03/2017 EF 60-65% with normal diastolic function. Had COVID-19 01/2021. At follow up 04/2021 noted chest pain with following cardiac CTA with calcium  score 579 placing her in 99th percentile. Noted significant stenosis to Cx by FFR. Underwent LHC 07/21/21 with PCI of distal circumflex, mid Cx 25%, 2nd diag 50%, mid LAD 25%. Her EF 55-65%. Recommended for DAPT ASA/Plavix  for 6 mos then consider Plavix  monotherapy. At follow up 07/28/21 she was doing well from cardiac perspective and encouraged to participate in cardiac rehab. Hydralazine  added due to elevated BP.   Seen 09/23/21 doing well from cardiac perspective and no changes made.  A follow-up 11/08/2021 noted lower extremity edema and hydrochlorothiazide  had been discontinued unclear date.  It was resumed at 25 mg daily.  Swelling presumed due to venous insufficiency.  Via subsequent MyChart encounter and based on labs her hydrochlorothiazide  was stopped and transition to Lasix  20 mg daily.  Seen by Dr. Theodis Fiscal 08/17/2022.  Hydralazine  increased from 25 to 50 mg twice daily.  Amlodipine , carvedilol  were continued. Recommended continue furosemide  20 mg daily and use increased dose 40 mg as needed for swelling.  She was giving amoxicillin  for a dental abscess and encouraged to seek earlier dental appointment.  She was also recommended for in lab sleep study as prior home sleep study was unsuccessful due to poor Internet service.   Renal Doppler 10/2022 with left renal artery 1-59% stenosis recommended for repeat duplex in 1 year for monitoring.  In lab sleep study not yet performed as she has cancelled.  At visit 05/10/23 BP not at goal, she misunderstood and was not taking Hydralazine . It was resumed at 25mg  BID.     Melissa Casey is a 58 year old female with coronary artery disease and hypertension who presents with chest discomfort.  She experiences chest discomfort described as a sensation of heat, particularly when lying down, and associates it with taking multiple medications before bed. 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 Occasionally, she experiences discomfort in her left arm, especially when walking, which requires her to stop. Her blood pressure has been variable, with recent readings of 152/78 mmHg and 143/68 mmHg before taking her medication. She experiences muscle tightness and discomfort in her chest and neck. Encouraged stretching.      Previous antihypertensives:   Past Medical History:  Diagnosis Date   CAD in native artery 07/15/2021   DVT (deep venous thrombosis) (HCC)    remote past, over 20 years ago, left leg   Essential hypertension, benign    GERD (gastroesophageal reflux disease) 07/15/2021   History of COVID-19 08/2019   History of kidney stones    IBS (irritable bowel syndrome)    Left arm pain 05/01/2022   Premature ventricular contractions (PVCs) (VPCs)    Per Dr. Genita Keys   Pure hypercholesterolemia 05/23/2021   Resistant hypertension 11/16/2010   Type 2 diabetes mellitus (HCC)  Past Surgical History:  Procedure Laterality Date   BREAST BIOPSY Left 03/2015   fibrocystic changes   COLONOSCOPY N/A 07/26/2015   Procedure: COLONOSCOPY;  Surgeon: Alyce Jubilee, MD;  Location: [facility];  Service: Endoscopy;  Laterality: N/A;  130 - moved to 6/26 @1 :30 - office to notify pt   CORONARY STENT INTERVENTION N/A 07/21/2021    Procedure: CORONARY STENT INTERVENTION;  Surgeon: Lucendia Rusk, MD;  Location: [facility] INVASIVE CV LAB;  Service: Cardiovascular;  Laterality: N/A;   CYSTOSCOPY WITH RETROGRADE PYELOGRAM, URETEROSCOPY AND STENT PLACEMENT Right 12/24/2019   Procedure: CYSTOSCOPY WITH RETROGRADE PYELOGRAM, URETEROSCOPY AND STENT PLACEMENT;  Surgeon: Florencio Hunting, MD;  Location: [facility];  Service: Urology;  Laterality: Right;   CYSTOSCOPY/URETEROSCOPY/HOLMIUM LASER/STENT PLACEMENT Right 01/27/2020   Procedure: CYSTOSCOPY/URETEROSCOPY/HOLMIUM LASER/STENT PLACEMENT;  Surgeon: Roxane Copp, MD;  Location: [facility];  Service: Urology;  Laterality: Right;   ESOPHAGOGASTRODUODENOSCOPY N/A 07/26/2015   Procedure: ESOPHAGOGASTRODUODENOSCOPY (EGD);  Surgeon: Alyce Jubilee, MD;  Location: [facility];  Service: Endoscopy;  Laterality: N/A;   LEFT HEART CATH AND CORONARY ANGIOGRAPHY N/A 07/21/2021   Procedure: LEFT HEART CATH AND CORONARY ANGIOGRAPHY;  Surgeon: Lucendia Rusk, MD;  Location: [facility] INVASIVE CV LAB;  Service: Cardiovascular;  Laterality: N/A;    Current Medications: Current Meds  Medication Sig   amLODipine  (NORVASC ) 5 MG tablet Take 1 tablet (5 mg total) by mouth daily.   aspirin  EC 81 MG tablet Take 81 mg by mouth in the morning. Swallow whole.   atorvastatin  (LIPITOR) 40 MG tablet TAKE 1 TABLET BY MOUTH EVERY EVENING   carvedilol  (COREG ) 25 MG tablet Take 1 tablet (25 mg total) by mouth 2 (two) times daily with a meal.   clotrimazole-betamethasone (LOTRISONE) cream Apply 1 Application topically at bedtime.   furosemide  (LASIX ) 20 MG tablet Take 1 tablet (20 mg total) by mouth daily. OK TO TAKE AN EXTRA TABLET AS NEEDED FOR SWELLING/SHORTNESS OF BREATH   hydrALAZINE  (APRESOLINE ) 25 MG tablet Take 1 tablet (25 mg total) by mouth in the morning and at bedtime.   ibuprofen  (ADVIL ) 800 MG tablet Take 800 mg by mouth. Every 8-12 hours as needed   Insulin  Degludec FlexTouch 200 UNIT/ML SOPN Inject 60  Units into the skin daily.   LINZESS 145 MCG CAPS capsule Take 145 mcg by mouth as needed.   pantoprazole  (PROTONIX ) 40 MG tablet Take 1 tablet by mouth once daily   sitaGLIPtin-metformin (JANUMET) 50-1000 MG tablet Take 1 tablet by mouth 2 (two) times daily with a meal.   spironolactone  (ALDACTONE ) 25 MG tablet Take 1 tablet by mouth once daily   telmisartan  (MICARDIS ) 80 MG tablet Take 1 tablet by mouth once daily     Allergies:   Patient has no known allergies.   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   Occupation: [employer]     Comment: part time   Occupation:    Tobacco Use   Smoking status: Never   Smokeless tobacco: Never  Vaping Use   Vaping status: Never Used  Substance and Sexual Activity   Alcohol use: No    Alcohol/week: 0.0 standard drinks of  alcohol   Drug use: No   Sexual activity: Not on file  Other Topics Concern   Not on file  Social History Narrative   Not on file   Social Drivers of Health   Financial Resource Strain: Low Risk  (05/23/2021)   Overall Financial Resource Strain (CARDIA)    Difficulty of Paying Living Expenses: Not hard at all  Food Insecurity: No Food Insecurity (05/23/2021)   Hunger Vital Sign    Worried About Running Out of Food in the Last Year: Never true    Ran Out of Food in the Last Year: Never true  Transportation Needs: No Transportation Needs (05/23/2021)   PRAPARE - Administrator, Civil Service (Medical): No    Lack of Transportation (Non-Medical): No  Physical Activity: Inactive (05/23/2021)   Exercise Vital Sign    Days of Exercise per Week: 0 days    Minutes of Exercise per Session: 0 min  Stress: Not on file  Social Connections: Not  on file     Family History: The patient's family history includes Arrhythmia in her sister; Coronary artery disease in her father and mother; Heart attack (age of onset: 35) in her sister; Heart disease in her brother and father; Hypertension in her brother. There is no history of Colon cancer.  ROS:   Please see the history of present illness.    All other systems reviewed and are negative.  EKGs/Labs/Other Studies Reviewed:         Recent Labs: No results found for requested labs within last 365 days.   Recent Lipid Panel    Component Value Date/Time   CHOL 123 07/13/2021 0903   TRIG 98 07/13/2021 0903   HDL 47 07/13/2021 0903   CHOLHDL 2.6 07/13/2021 0903   LDLCALC 58 07/13/2021 0903    Physical Exam:   VS:  BP (!) 163/78 (BP Location: Left Arm, Patient Position: Sitting, Cuff Size: Large)   Pulse 67   Ht 5' 8 (1.727 m)   Wt 275 lb 12.8 oz (125.1 kg)   LMP 07/24/2015 Comment: currently experiencing cycle at this time  SpO2 99%   BMI 41.94 kg/m  , BMI Body mass index is 41.94 kg/m. GENERAL:  Well appearing, overweight.  HEENT: Pupils equal round and reactive, fundi not visualized, oral mucosa unremarkable NECK:  No jugular venous distention, waveform within normal limits, carotid upstroke brisk and symmetric, no bruits, no thyromegaly LYMPHATICS:  No cervical adenopathy LUNGS:  Clear to auscultation bilaterally HEART:  RRR.  PMI not displaced or sustained,S1 and S2 within normal limits, no S3, no S4, no clicks, no rubs, no murmurs ABD:  Flat, positive bowel sounds normal in frequency in pitch, no bruits, no rebound, no guarding, no midline pulsatile mass, no hepatomegaly, no splenomegaly EXT:  2 plus pulses throughout, no edema, no cyanosis no clubbing SKIN:  No rashes no nodules NEURO:  Cranial nerves II through XII grossly intact, motor grossly intact throughout PSYCH:  Cognitively intact, oriented to person place and time   ASSESSMENT/PLAN:     Coronary  artery disease  Atypical chest sensations and sweating episodes. EKG today NSR with no acute ST/T wave changes. Symptoms overall atypical for coronary artery disease. However, given persistence of chest pain plan for lexiscan myoview for ischemia eval.   Informed Consent   Shared Decision Making/Informed Consent The risks [chest pain, shortness of breath, cardiac arrhythmias, dizziness, blood pressure fluctuations, myocardial infarction, stroke/transient ischemic attack, nausea, vomiting, allergic reaction, radiation exposure,  metallic taste sensation and life-threatening complications (estimated to be 1 in 10,000)], benefits (risk stratification, diagnosing coronary artery disease, treatment guidance) and alternatives of a nuclear stress test were discussed in detail with Ms. Pellicano and she agrees to proceed.     Hypertension Blood pressure remains elevated despite current  regimen. Recent readings 152/78 mmHg and 143/68 mmHg. - Increase hydralazine  to 50 mg twice daily by taking two 25 mg tablets in the morning and two in the evening. - Send prescription for 50 mg hydralazine  tablets to pharmacy. -Continue amlodipine  5mg  daily (swelling on higher doses), Spironolactone  25mg  daily, Telmisartan  80mg  daily. -Discussed to monitor BP at home at least 2 hours after medications and sitting for 5-10 minutes.   Muscle tightness and tenderness Muscle tightness and tenderness in chest and neck likely due to stress or tension. Occupation may contribute to muscle tension. - Provide handout on neck stretches to alleviate muscle tension. - Advise use of heat pack on chest and neck to relieve muscle tightness.     Peripheral edema Well controlled on Lasix  20mg  daily with additional 20mg  PRN. Continue same.   Sleep apnea previous attempt at home sleep study unsuccessful due to poor Internet service.  -Reschedule in lab sleep study. She was provided phone number to call and schedule.   Screening for  Secondary Hypertension:     05/01/2022    8:22 AM 05/11/2023    5:00 PM  Causes  Drugs/Herbals Screened      - Comments occasional iburprofen.  limits salt.  No EtOH.  Rare caffeine.   Renovascular HTN Screened      - Comments check renal Dopplers   Sleep Apnea Screened Screened     - Comments check sleep stuidy sleep study re-ordered  Thyroid Disease Screened   Hyperaldosteronism  Screened     - Comments  unremarkable renin-aldo 2023  Pheochromocytoma  Screened     - Comments  normal adrenals by CT 2021    Relevant Labs/Studies:    Latest Ref Rng & Units 12/26/2021    9:53 AM 12/16/2021    4:26 PM 11/01/2021    8:20 AM  Basic Labs  Sodium 134 - 144 mmol/L 138  138  140   Potassium 3.5 - 5.2 mmol/L 4.1  3.6  3.5   Creatinine 0.57 - 1.00 mg/dL 4.09  8.11  9.14        Latest Ref Rng & Units 12/26/2021    9:53 AM  Thyroid   TSH 0.450 - 4.500 uIU/mL 1.340        Latest Ref Rng & Units 05/01/2022    8:55 AM  Renin/Aldosterone   Aldosterone 0.0 - 30.0 ng/dL 6.2   Aldos/Renin Ratio 0.0 - 30.0 16.8              11/03/2022    8:08 AM  Renovascular   Renal Artery US  Completed Yes     Disposition:    FU with MD/APP/PharmD in 2-3 months    Medication Adjustments/Labs and Tests Ordered: Current medicines are reviewed at length with the patient today.  Concerns regarding medicines are outlined above.  Orders Placed This Encounter  Procedures   EKG 12-Lead   No orders of the defined types were placed in this encounter.    Signed, Clearnce Curia, NP  07/12/2023 9:31 AM    [facility]

## 2023-07-13 ENCOUNTER — Encounter (HOSPITAL_COMMUNITY): Payer: Self-pay | Admitting: *Deleted

## 2023-07-13 ENCOUNTER — Telehealth (HOSPITAL_COMMUNITY): Payer: Self-pay | Admitting: *Deleted

## 2023-07-13 NOTE — Telephone Encounter (Signed)
 Pt sent MPI study instructions via [employer].

## 2023-07-14 ENCOUNTER — Encounter (HOSPITAL_BASED_OUTPATIENT_CLINIC_OR_DEPARTMENT_OTHER): Payer: Self-pay | Admitting: Family

## 2023-07-16 ENCOUNTER — Encounter (HOSPITAL_COMMUNITY): Payer: Self-pay | Admitting: *Deleted

## 2023-07-16 ENCOUNTER — Telehealth (HOSPITAL_COMMUNITY): Payer: Self-pay | Admitting: *Deleted

## 2023-07-16 NOTE — Telephone Encounter (Signed)
 Reminder and instruction letter sent via [employer] for upcoming stress test on 07/23/23

## 2023-07-19 ENCOUNTER — Other Ambulatory Visit (HOSPITAL_BASED_OUTPATIENT_CLINIC_OR_DEPARTMENT_OTHER): Payer: Self-pay | Admitting: Family

## 2023-07-19 DIAGNOSIS — I493 Ventricular premature depolarization: Secondary | ICD-10-CM

## 2023-07-19 DIAGNOSIS — I1A0 Resistant hypertension: Secondary | ICD-10-CM

## 2023-07-19 DIAGNOSIS — I209 Angina pectoris, unspecified: Secondary | ICD-10-CM

## 2023-07-19 DIAGNOSIS — I2510 Atherosclerotic heart disease of native coronary artery without angina pectoris: Secondary | ICD-10-CM

## 2023-07-20 ENCOUNTER — Telehealth (HOSPITAL_COMMUNITY): Payer: Self-pay | Admitting: *Deleted

## 2023-07-20 NOTE — Telephone Encounter (Signed)
 I had a short conversation with Melissa Casey regarding her STRESS TEST on 07/27/23 at 10:30. She stated that she didn't have any questions about the test at this time. I did let her know that if she needed to cancel, reschedule or had any questions to give us  a call.

## 2023-07-23 ENCOUNTER — Ambulatory Visit (HOSPITAL_COMMUNITY): Admission: RE | Admit: 2023-07-23 | Source: Ambulatory Visit

## 2023-07-26 ENCOUNTER — Encounter (HOSPITAL_BASED_OUTPATIENT_CLINIC_OR_DEPARTMENT_OTHER): Admitting: Family

## 2023-07-27 ENCOUNTER — Encounter (HOSPITAL_COMMUNITY): Payer: Self-pay | Admitting: Cardiovascular Disease

## 2023-07-27 ENCOUNTER — Ambulatory Visit (HOSPITAL_COMMUNITY): Admission: RE | Admit: 2023-07-27 | Source: Ambulatory Visit | Attending: Family

## 2023-08-09 ENCOUNTER — Encounter (HOSPITAL_COMMUNITY): Payer: Self-pay | Admitting: *Deleted

## 2023-08-09 ENCOUNTER — Telehealth (HOSPITAL_COMMUNITY): Payer: Self-pay | Admitting: *Deleted

## 2023-08-09 NOTE — Telephone Encounter (Signed)
 Pt sent stress test instructions via [employer].

## 2023-08-12 ENCOUNTER — Other Ambulatory Visit (HOSPITAL_BASED_OUTPATIENT_CLINIC_OR_DEPARTMENT_OTHER): Payer: Self-pay | Admitting: Family

## 2023-08-15 ENCOUNTER — Encounter (HOSPITAL_BASED_OUTPATIENT_CLINIC_OR_DEPARTMENT_OTHER): Payer: Self-pay | Admitting: Family

## 2023-08-15 DIAGNOSIS — I2510 Atherosclerotic heart disease of native coronary artery without angina pectoris: Secondary | ICD-10-CM

## 2023-08-15 DIAGNOSIS — I1A0 Resistant hypertension: Secondary | ICD-10-CM

## 2023-08-15 DIAGNOSIS — I493 Ventricular premature depolarization: Secondary | ICD-10-CM

## 2023-08-15 DIAGNOSIS — I209 Angina pectoris, unspecified: Secondary | ICD-10-CM

## 2023-08-20 ENCOUNTER — Telehealth (HOSPITAL_COMMUNITY): Payer: Self-pay | Admitting: *Deleted

## 2023-08-20 NOTE — Telephone Encounter (Signed)
 Left message on voicemail per DPR in reference to upcoming appointment scheduled on  08/23/23 with detailed instructions given per Myocardial Perfusion Study Information Sheet for the test. LM to arrive 15 minutes early, and that it is imperative to arrive on time for appointment to keep from having the test rescheduled. If you need to cancel or reschedule your appointment, please call the office within 24 hours of your appointment. Failure to do so may result in a cancellation of your appointment, and a $50 no show fee. Phone number given for call back for any questions.

## 2023-08-23 ENCOUNTER — Ambulatory Visit (HOSPITAL_COMMUNITY): Admission: RE | Admit: 2023-08-23 | Source: Ambulatory Visit | Attending: Family | Admitting: Family

## 2023-08-28 ENCOUNTER — Other Ambulatory Visit (HOSPITAL_BASED_OUTPATIENT_CLINIC_OR_DEPARTMENT_OTHER): Payer: Self-pay | Admitting: Family

## 2023-09-27 ENCOUNTER — Encounter (HOSPITAL_BASED_OUTPATIENT_CLINIC_OR_DEPARTMENT_OTHER): Admitting: Family

## 2023-10-28 ENCOUNTER — Other Ambulatory Visit (HOSPITAL_BASED_OUTPATIENT_CLINIC_OR_DEPARTMENT_OTHER): Payer: Self-pay | Admitting: Cardiovascular Disease

## 2023-10-28 DIAGNOSIS — R600 Localized edema: Secondary | ICD-10-CM

## 2023-11-10 ENCOUNTER — Other Ambulatory Visit (HOSPITAL_BASED_OUTPATIENT_CLINIC_OR_DEPARTMENT_OTHER): Payer: Self-pay | Admitting: Cardiovascular Disease

## 2023-11-10 DIAGNOSIS — R600 Localized edema: Secondary | ICD-10-CM

## 2023-11-13 ENCOUNTER — Telehealth: Payer: Self-pay | Admitting: Cardiovascular Disease

## 2023-11-13 ENCOUNTER — Ambulatory Visit (INDEPENDENT_AMBULATORY_CARE_PROVIDER_SITE_OTHER)

## 2023-11-13 ENCOUNTER — Ambulatory Visit (HOSPITAL_BASED_OUTPATIENT_CLINIC_OR_DEPARTMENT_OTHER): Payer: Self-pay | Admitting: Family

## 2023-11-13 DIAGNOSIS — I1A0 Resistant hypertension: Secondary | ICD-10-CM

## 2023-11-13 DIAGNOSIS — I701 Atherosclerosis of renal artery: Secondary | ICD-10-CM

## 2023-11-13 NOTE — Telephone Encounter (Signed)
-----   Message from Reche GORMAN Finder sent at 11/13/2023  2:36 PM EDT ----- Mild bilateral renal artery stenosis. Continue atorvastatin  to prevent progression. Repeat renal duplex in 1 year for monitoring.  ----- Message ----- From: Interface, Three One Seven Sent: 11/13/2023  11:51 AM EDT To: Reche GORMAN Finder, NP

## 2023-11-13 NOTE — Telephone Encounter (Signed)
 error

## 2023-11-13 NOTE — Telephone Encounter (Signed)
 Left message for the pt to call back for results.  Results were also sent to the pts active mychart account to review by Reche Finder, NP.   Will go ahead and place the renal US  in one year in the system and endorse this plan to the pt via mychart.  Scheduling will reach out to her closer to that time frame to arrange that appt.

## 2023-12-05 ENCOUNTER — Other Ambulatory Visit (HOSPITAL_BASED_OUTPATIENT_CLINIC_OR_DEPARTMENT_OTHER): Payer: Self-pay | Admitting: Cardiovascular Disease

## 2023-12-20 ENCOUNTER — Encounter (HOSPITAL_BASED_OUTPATIENT_CLINIC_OR_DEPARTMENT_OTHER): Payer: Self-pay | Admitting: Family

## 2023-12-20 ENCOUNTER — Ambulatory Visit (INDEPENDENT_AMBULATORY_CARE_PROVIDER_SITE_OTHER): Admitting: Family

## 2023-12-20 VITALS — BP 126/78 | HR 65 | Ht 69.0 in | Wt 274.6 lb

## 2023-12-20 DIAGNOSIS — E785 Hyperlipidemia, unspecified: Secondary | ICD-10-CM | POA: Diagnosis not present

## 2023-12-20 DIAGNOSIS — I25118 Atherosclerotic heart disease of native coronary artery with other forms of angina pectoris: Secondary | ICD-10-CM | POA: Diagnosis not present

## 2023-12-20 DIAGNOSIS — I701 Atherosclerosis of renal artery: Secondary | ICD-10-CM

## 2023-12-20 DIAGNOSIS — I10 Essential (primary) hypertension: Secondary | ICD-10-CM | POA: Diagnosis not present

## 2023-12-20 MED ORDER — ATORVASTATIN CALCIUM 40 MG PO TABS: 40.0000 mg | ORAL_TABLET | Freq: Every evening | ORAL | 3 refills | Status: AC

## 2023-12-20 NOTE — Progress Notes (Signed)
 Advanced Hypertension Clinic Assessment:    Date:  12/20/2023   ID:  Melissa Casey, Melissa Casey , MRN [id number]  PCP:  Leigh Lung, MD  Cardiologist:  Annabella Scarce, MD  Nephrologist:  Referring MD: Leigh Lung, MD   CC: Hypertension  History of Present Illness:    Melissa Casey is a 58 y.o. female with a hx of hypertension, hyperlipidemia, DM2, obesity here to follow up in the Advanced Hypertension Clinic.   Prior monitor 03/2017 frequent PVC in setting of hypokalemia which were improved with Metoprolol . Echo 03/2017 EF 60-65% with normal diastolic function. Had COVID-19 01/2021. At follow up 04/2021 noted chest pain with following cardiac CTA with calcium  score 579 placing her in 99th percentile. Noted significant stenosis to Cx by FFR. Underwent LHC 07/21/21 with PCI of distal circumflex, mid Cx 25%, 2nd diag 50%, mid LAD 25%. Her EF 55-65%. Recommended for DAPT ASA/Plavix  for 6 mos then consider Plavix  monotherapy. At follow up 07/28/21 she was doing well from cardiac perspective and encouraged to participate in cardiac rehab. Hydralazine  added due to elevated BP.   Seen 09/23/21 doing well from cardiac perspective and no changes made.  A follow-up 11/08/2021 noted lower extremity edema and hydrochlorothiazide  had been discontinued unclear date.  It was resumed at 25 mg daily.  Swelling presumed due to venous insufficiency.  Via subsequent MyChart encounter and based on labs her hydrochlorothiazide  was stopped and transition to Lasix  20 mg daily.  Seen by Dr. Scarce 08/17/2022.  Hydralazine  increased from 25 to 50 mg twice daily.  Amlodipine , carvedilol  were continued. Recommended continue furosemide  20 mg daily and use increased dose 40 mg as needed for swelling.  She was giving amoxicillin  for a dental abscess and encouraged to seek earlier dental appointment.  She was also recommended for in lab sleep study as prior home sleep study was unsuccessful due to poor Internet service.   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 In lab sleep study not yet performed as she has cancelled.  At visit 05/10/23 BP not at goal, she misunderstood and was not taking Hydralazine . It was resumed at 25mg  BID.   At visit 07/12/23 with chest discomfort when laying down and some pain in her arms when walking. Myoview ordered, never performed as she cancelled once and no-showed twice. Her Hydralazine  was adjusted to 50mg  BID as BP poorly controlled. Amlodipine  5mg  daily, spironolactone  25mg  daily, telmisartan  80mg  daily continued. She was provided phone number to reschedule her in -lab sleep study, this has not been rescheduled.   Renal duplex 11/13/23 bilateral 1-59% stenosis recommended to repeat imaging in 1 year.   Presents today for follow up.  Checking BP sporadically at home. Her prior chest pain has resolved. Some lightheaded which she attributes her glasses, she has a visit upcoming wwith optometrist. Also notes feling that her insulin  dose might be too high as having some hypoglycemic spells, plans to discuss with PCP. No exertional dyspnea, orthopnea, PND, palpitations.  Previous antihypertensives:   Past Medical History:  Diagnosis Date   CAD in native artery 07/15/2021   DVT (deep venous thrombosis) (HCC)    remote past, over 20 years ago, left leg   Essential hypertension, benign    GERD (gastroesophageal reflux disease) 07/15/2021   History of COVID-19 08/2019   History of kidney stones    IBS (irritable bowel syndrome)    Left arm pain 05/01/2022   Premature ventricular contractions (PVCs) (VPCs)    Per Dr. Leigh  Pure hypercholesterolemia 05/23/2021   Resistant hypertension 11/16/2010   Type 2 diabetes mellitus [facility])     Past Surgical History:  Procedure Laterality Date   BREAST BIOPSY Left 03/2015   fibrocystic changes   COLONOSCOPY N/A 07/26/2015   Procedure: COLONOSCOPY;  Surgeon: Margo LITTIE Haddock, MD;  Location[facility];  Service:  Endoscopy;  Laterality: N/A;  130 - moved to 6/26 @1 :30 - office to notify pt   CORONARY STENT INTERVENTION N/A 07/21/2021   Procedure: CORONARY STENT INTERVENTION;  Surgeon: Dann Candyce RAMAN, MD;  Location: [facility] INVASIVE CV LAB;  Service: Cardiovascular;  Laterality: N/A;   CYSTOSCOPY WITH RETROGRADE PYELOGRAM, URETEROSCOPY AND STENT PLACEMENT Right 12/24/2019   Procedure: CYSTOSCOPY WITH RETROGRADE PYELOGRAM, URETEROSCOPY AND STENT PLACEMENT;  Surgeon: Renda Glance, MD;  Location: [facility] ORS;  Service: Urology;  Laterality: Right;   CYSTOSCOPY/URETEROSCOPY/HOLMIUM LASER/STENT PLACEMENT Right 01/27/2020   Procedure: CYSTOSCOPY/URETEROSCOPY/HOLMIUM LASER/STENT PLACEMENT;  Surgeon: Elisabeth Valli BIRCH, MD;  Location: [facility] ORS;  Service: Urology;  Laterality: Right;   ESOPHAGOGASTRODUODENOSCOPY N/A 07/26/2015   Procedure: ESOPHAGOGASTRODUODENOSCOPY (EGD);  Surgeon: Margo LITTIE Haddock, MD;  Location: [facility];  Service: Endoscopy;  Laterality: N/A;   LEFT HEART CATH AND CORONARY ANGIOGRAPHY N/A 07/21/2021   Procedure: LEFT HEART CATH AND CORONARY ANGIOGRAPHY;  Surgeon: Dann Candyce RAMAN, MD;  Location: [facility] INVASIVE CV LAB;  Service: Cardiovascular;  Laterality: N/A;    Current Medications: No outpatient medications have been marked as taking for the 12/20/23 encounter (Appointment) with Vannie Reche RAMAN, NP.     Allergies:   Patient has no known allergies.   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   Occupation: [employer]     Comment: part time   Occupation:    Tobacco Use   Smoking status: Never   Smokeless tobacco: Never  Vaping Use   Vaping status: Never Used  Substance and Sexual Activity   Alcohol use: No     Alcohol/week: 0.0 standard drinks of alcohol   Drug use: No   Sexual activity: Not on file  Other Topics Concern   Not on file  Social History Narrative   Not on file   Social Drivers of Health   Financial Resource Strain: Low Risk  (05/23/2021)   Overall Financial Resource Strain (CARDIA)    Difficulty of Paying Living Expenses: Not hard at all  Food Insecurity: No Food Insecurity (05/23/2021)   Hunger Vital Sign    Worried About Running Out of Food in the Last Year: Never true    Ran Out of Food in the Last Year: Never true  Transportation Needs: No Transportation Needs (05/23/2021)   PRAPARE - Administrator, Civil Service (Medical): No    Lack of Transportation (Non-Medical): No  Physical Activity: Inactive (05/23/2021)   Exercise Vital Sign    Days of Exercise per Week: 0 days    Minutes of Exercise per Session: 0 min  Stress: Not on file  Social Connections: Not on file     Family History: The patient's family history includes Arrhythmia in her sister; Coronary artery disease in her father and mother; Heart attack (age of onset: 24) in  her sister; Heart disease in her brother and father; Hypertension in her brother. There is no history of Colon cancer.  ROS:   Please see the history of present illness.    All other systems reviewed and are negative.  EKGs/Labs/Other Studies Reviewed:         Recent Labs: No results found for requested labs within last 365 days.   Recent Lipid Panel    Component Value Date/Time   CHOL 123 07/13/2021 0903   TRIG 98 07/13/2021 0903   HDL 47 07/13/2021 0903   CHOLHDL 2.6 07/13/2021 0903   LDLCALC 58 07/13/2021 0903    Physical Exam:   VS:  LMP 07/24/2015 Comment: currently experiencing cycle at this time , BMI There is no height or weight on file to calculate BMI. GENERAL:  Well appearing, overweight.  HEENT: Pupils equal round and reactive, fundi not visualized, oral mucosa unremarkable NECK:  No jugular venous  distention, waveform within normal limits, carotid upstroke brisk and symmetric, no bruits, no thyromegaly LYMPHATICS:  No cervical adenopathy LUNGS:  Clear to auscultation bilaterally HEART:  RRR.  PMI not displaced or sustained,S1 and S2 within normal limits, no S3, no S4, no clicks, no rubs, no murmurs ABD:  Flat, positive bowel sounds normal in frequency in pitch, no bruits, no rebound, no guarding, no midline pulsatile mass, no hepatomegaly, no splenomegaly EXT:  2 plus pulses throughout, no edema, no cyanosis no clubbing SKIN:  No rashes no nodules NEURO:  Cranial nerves II through XII grossly intact, motor grossly intact throughout PSYCH:  Cognitively intact, oriented to person place and time   ASSESSMENT/PLAN:     Coronary artery disease / HLD, LDL goal <70 Stable with no anginal symptoms. No indication for ischemic evaluation.  LDL not at goal, suspect she ran out of Atorvastatin  and was unaware. She will check her pill bottles at home. If not taking Atorvastatin , resume with FLP/ALT in 2-3 months. If she lets us  know she is taking, plan to increase to 80mg  dose with FLP/ALT in 2-3 months.  -Continue GDMT aspirin  81mg  daily, atorvastatin  40mg  daily, coreg  25mg  BID, pavix 75mg  daily.   Hypertension BP well controlled. Continue current antihypertensive regimen hydralazine  to 50 mg twice daily , amlodipine  5mg  daily (swelling on higher doses), Spironolactone  25mg  daily, Telmisartan  80mg  daily. -Discussed to monitor BP at home at least 2 hours after medications and sitting for 5-10 minutes.     Peripheral edema Well controlled on Lasix  20mg  daily with additional 20mg  PRN. Continue same. She is not requiring extra tablets of Furosemide .   Sleep apnea previous attempt at home sleep study unsuccessful due to poor Internet service.  -Reschedule in lab sleep study. She was again provided phone number to call and schedule.   Screening for Secondary Hypertension:     05/01/2022    8:22 AM  05/11/2023    5:00 PM  Causes  Drugs/Herbals Screened      - Comments occasional iburprofen.  limits salt.  No EtOH.  Rare caffeine.   Renovascular HTN Screened      - Comments check renal Dopplers   Sleep Apnea Screened Screened     - Comments check sleep stuidy sleep study re-ordered  Thyroid Disease Screened   Hyperaldosteronism  Screened     - Comments  unremarkable renin-aldo 2023  Pheochromocytoma  Screened     - Comments  normal adrenals by CT 2021    Relevant Labs/Studies:    Latest Ref Rng & Units 12/26/2021  9:53 AM 12/16/2021    4:26 PM 11/01/2021    8:20 AM  Basic Labs  Sodium 134 - 144 mmol/L 138  138  140   Potassium 3.5 - 5.2 mmol/L 4.1  3.6  3.5   Creatinine 0.57 - 1.00 mg/dL 9.34  9.36  9.31        Latest Ref Rng & Units 12/26/2021    9:53 AM  Thyroid   TSH 0.450 - 4.500 uIU/mL 1.340        Latest Ref Rng & Units 05/01/2022    8:55 AM  Renin/Aldosterone   Aldosterone 0.0 - 30.0 ng/dL 6.2   Aldos/Renin Ratio 0.0 - 30.0 16.8              11/13/2023    3:34 PM  Renovascular   Renal Artery US  Completed Yes     Disposition:    FU with MD/APP/PharmD in 6 months    Medication Adjustments/Labs and Tests Ordered: Current medicines are reviewed at length with the patient today.  Concerns regarding medicines are outlined above.  No orders of the defined types were placed in this encounter.  No orders of the defined types were placed in this encounter.    Signed, Reche GORMAN Finder, NP  12/20/2023 11:28 AM    [facility]

## 2023-12-20 NOTE — Patient Instructions (Addendum)
 Medication Instructions:   Check your pill bottles at home to be sure you have Atorvastatin . Check the expiration date.   If you are not taking, resume Atorvastatin  (Lipitor) 40mg  daily  If you are taking, call or send a MyChart to let us  know as we may need to change your cholesterol medication.   *If you need a refill on your cardiac medications before your next appointment, please call your pharmacy*  Lab Work: Your physician recommends that you return for lab work in 2-3 months for fasting cholesterol panel and ALT  If you have labs (blood work) drawn today and your tests are completely normal, you will receive your results only by: MyChart Message (if you have MyChart) OR A paper copy in the mail If you have any lab test that is abnormal or we need to change your treatment, we will call you to review the results.  Follow-Up: At [facility], you and your health needs are our priority.  As part of our continuing mission to provide you with exceptional heart care, our providers are all part of one team.  This team includes your primary Cardiologist (physician) and Advanced Practice Providers or APPs (Physician Assistants and Nurse Practitioners) who all work together to provide you with the care you need, when you need it.  Your next appointment:   6 month(s)  Provider:   In Advanced Hypertension Clinic with Dr. Raford or Reche GORMAN Finder, NP   We recommend signing up for the patient portal called MyChart.  Sign up information is provided on this After Visit Summary.  MyChart is used to connect with patients for Virtual Visits (Telemedicine).  Patients are able to view lab/test results, encounter notes, upcoming appointments, etc.  Non-urgent messages can be sent to your provider as well.   To learn more about what you can do with MyChart, go to [email or web address].   Other Instructions  Recommend calling to schedule your sleep study:  [facility] at [facility] [address] [place],  [place]  [address] Main: [phone]
